# Patient Record
Sex: Female | Born: 1955 | Race: White | Hispanic: No | Marital: Married | Smoking: Former smoker
Health system: Southern US, Community
[De-identification: ages and names within clinical notes are randomized; demographics above are authoritative.]

## PROBLEM LIST (undated history)

## (undated) DIAGNOSIS — N189 Chronic kidney disease, unspecified: Secondary | ICD-10-CM

## (undated) DIAGNOSIS — T7840XA Allergy, unspecified, initial encounter: Secondary | ICD-10-CM

## (undated) DIAGNOSIS — E039 Hypothyroidism, unspecified: Secondary | ICD-10-CM

## (undated) DIAGNOSIS — K5792 Diverticulitis of intestine, part unspecified, without perforation or abscess without bleeding: Secondary | ICD-10-CM

## (undated) DIAGNOSIS — F32A Depression, unspecified: Secondary | ICD-10-CM

## (undated) DIAGNOSIS — K219 Gastro-esophageal reflux disease without esophagitis: Secondary | ICD-10-CM

## (undated) DIAGNOSIS — E785 Hyperlipidemia, unspecified: Secondary | ICD-10-CM

## (undated) DIAGNOSIS — M1990 Unspecified osteoarthritis, unspecified site: Secondary | ICD-10-CM

## (undated) DIAGNOSIS — D649 Anemia, unspecified: Secondary | ICD-10-CM

## (undated) DIAGNOSIS — F419 Anxiety disorder, unspecified: Secondary | ICD-10-CM

## (undated) DIAGNOSIS — F329 Major depressive disorder, single episode, unspecified: Secondary | ICD-10-CM

## (undated) HISTORY — DX: Anemia, unspecified: D64.9

## (undated) HISTORY — DX: Depression, unspecified: F32.A

## (undated) HISTORY — PX: APPENDECTOMY: SHX54

## (undated) HISTORY — DX: Hyperlipidemia, unspecified: E78.5

## (undated) HISTORY — DX: Diverticulitis of intestine, part unspecified, without perforation or abscess without bleeding: K57.92

## (undated) HISTORY — DX: Unspecified osteoarthritis, unspecified site: M19.90

## (undated) HISTORY — DX: Allergy, unspecified, initial encounter: T78.40XA

## (undated) HISTORY — DX: Anxiety disorder, unspecified: F41.9

## (undated) HISTORY — DX: Chronic kidney disease, unspecified: N18.9

## (undated) HISTORY — DX: Hypothyroidism, unspecified: E03.9

## (undated) HISTORY — DX: Major depressive disorder, single episode, unspecified: F32.9

## (undated) HISTORY — DX: Gastro-esophageal reflux disease without esophagitis: K21.9

---

## 1992-09-24 DIAGNOSIS — K5792 Diverticulitis of intestine, part unspecified, without perforation or abscess without bleeding: Secondary | ICD-10-CM

## 1992-09-24 HISTORY — PX: COLON SURGERY: SHX602

## 1992-09-24 HISTORY — DX: Diverticulitis of intestine, part unspecified, without perforation or abscess without bleeding: K57.92

## 1998-01-12 ENCOUNTER — Other Ambulatory Visit: Admission: RE | Admit: 1998-01-12 | Discharge: 1998-01-12 | Payer: Self-pay | Admitting: Obstetrics & Gynecology

## 1998-10-03 ENCOUNTER — Ambulatory Visit (HOSPITAL_COMMUNITY): Admission: RE | Admit: 1998-10-03 | Discharge: 1998-10-03 | Payer: Self-pay | Admitting: Obstetrics & Gynecology

## 1999-01-17 ENCOUNTER — Other Ambulatory Visit: Admission: RE | Admit: 1999-01-17 | Discharge: 1999-01-17 | Payer: Self-pay | Admitting: Obstetrics & Gynecology

## 1999-10-23 ENCOUNTER — Encounter: Admission: RE | Admit: 1999-10-23 | Discharge: 1999-10-23 | Payer: Self-pay | Admitting: Family Medicine

## 1999-10-23 ENCOUNTER — Encounter: Payer: Self-pay | Admitting: Family Medicine

## 2000-10-08 ENCOUNTER — Other Ambulatory Visit: Admission: RE | Admit: 2000-10-08 | Discharge: 2000-10-08 | Payer: Self-pay | Admitting: Family Medicine

## 2000-10-23 ENCOUNTER — Encounter: Payer: Self-pay | Admitting: Obstetrics & Gynecology

## 2000-10-23 ENCOUNTER — Encounter: Admission: RE | Admit: 2000-10-23 | Discharge: 2000-10-23 | Payer: Self-pay | Admitting: Obstetrics & Gynecology

## 2007-09-29 DIAGNOSIS — K219 Gastro-esophageal reflux disease without esophagitis: Secondary | ICD-10-CM | POA: Insufficient documentation

## 2007-09-29 DIAGNOSIS — F339 Major depressive disorder, recurrent, unspecified: Secondary | ICD-10-CM | POA: Insufficient documentation

## 2007-12-24 DIAGNOSIS — F411 Generalized anxiety disorder: Secondary | ICD-10-CM | POA: Insufficient documentation

## 2008-01-06 DIAGNOSIS — E119 Type 2 diabetes mellitus without complications: Secondary | ICD-10-CM | POA: Insufficient documentation

## 2008-01-28 DIAGNOSIS — E8881 Metabolic syndrome: Secondary | ICD-10-CM | POA: Insufficient documentation

## 2008-09-24 HISTORY — PX: BREAST SURGERY: SHX581

## 2009-02-24 DIAGNOSIS — E039 Hypothyroidism, unspecified: Secondary | ICD-10-CM | POA: Insufficient documentation

## 2009-06-02 DIAGNOSIS — M542 Cervicalgia: Secondary | ICD-10-CM | POA: Insufficient documentation

## 2009-06-24 DIAGNOSIS — E782 Mixed hyperlipidemia: Secondary | ICD-10-CM | POA: Insufficient documentation

## 2009-09-25 LAB — HM DIABETES EYE EXAM

## 2010-09-25 LAB — HM DIABETES FOOT EXAM

## 2012-01-14 ENCOUNTER — Ambulatory Visit (INDEPENDENT_AMBULATORY_CARE_PROVIDER_SITE_OTHER): Payer: Managed Care, Other (non HMO) | Admitting: Family Medicine

## 2012-01-14 VITALS — BP 106/70 | HR 81 | Temp 98.6°F | Resp 16 | Ht 63.58 in | Wt 233.0 lb

## 2012-01-14 DIAGNOSIS — R05 Cough: Secondary | ICD-10-CM

## 2012-01-14 DIAGNOSIS — R059 Cough, unspecified: Secondary | ICD-10-CM

## 2012-01-14 MED ORDER — HYDROCODONE-HOMATROPINE 5-1.5 MG/5ML PO SYRP: 5.0000 mL | ORAL_SOLUTION | Freq: Three times a day (TID) | ORAL | Status: AC | PRN

## 2012-01-14 MED ORDER — AZITHROMYCIN 250 MG PO TABS: ORAL_TABLET | ORAL | Status: AC

## 2012-01-14 MED ORDER — FLUTICASONE PROPIONATE 50 MCG/ACT NA SUSP: 2.0000 | Freq: Every day | NASAL | Status: DC

## 2012-01-14 NOTE — Progress Notes (Signed)
  Patient Name: Olivia Mosley Date of Birth: 1955/11/29 Medical Record Number: [id number] Gender: female Date of Encounter: 01/14/2012  History of Present Illness:  Olivia Mosley is a 56 y.o. very pleasant female patient who presents with the following:  Was treated by her PCP in [facility] for a sinus infection on 4/101/3 with augmentin for 7 days. Also received cough syrup (tussionex?) which helped a lot.  She continues to have a cough, ears are popping, sinus congestion.  Feels a "tickle" in her chest/ throat.  Cough is only occaionally productive.  No fevers anymore- she did have fevers at first.  Has felt fatigued- better now but the cough remains.   Does have "cough attacks" that are worrying her.   History of hypertension, DM, obesity There is no problem list on file for this patient.  No past medical history on file. No past surgical history on file. History  Substance Use Topics  . Smoking status: Former Games developer  . Smokeless tobacco: Not on file  . Alcohol Use: Not on file   No family history on file. Allergies  Allergen Reactions  . Flagyl (Metronidazole Hcl) Hives    Medication list has been reviewed and updated.  Review of Systems: As per HPI- otherwise negative.   Physical Examination: Filed Vitals:   01/14/12 1905  BP: 106/70  Pulse: 81  Temp: 98.6 F (37 C)  TempSrc: Oral  Resp: 16  Height: 5' 3.58" (1.615 m)  Weight: 233 lb (105.688 kg)    Body mass index is 40.52 kg/(m^2).  GEN: WDWN, NAD, Non-toxic, A & O x 3, obese HEENT: Atraumatic, Normocephalic. Neck supple. No masses, No LAD.  TM, oropharynx wnl, nasal cavity congested Ears and Nose: No external deformity. CV: RRR, No M/G/R. No JVD. No thrill. No extra heart sounds. PULM: CTA B, no wheezes, crackles, rhonchi. No retractions. No resp. distress. No accessory muscle use. ABD: S, NT, ND, +BS. No rebound. No HSM. EXTR: No c/c/e NEURO Normal gait.  PSYCH: Normally interactive. Conversant.  Not depressed or anxious appearing.  Calm demeanor.    Assessment and Plan: 1. Cough  HYDROcodone-homatropine (HYCODAN) 5-1.5 MG/5ML syrup, azithromycin (ZITHROMAX) 250 MG tablet  2. Allergic rhinitis  fluticasone (FLONASE) 50 MCG/ACT nasal spray   Will treat for atypicals with azithromycin, also allergies with flonase.  Gave hycodan rx as well- caution re: sedation.  Let us know if not better in a few days-Sooner if worse.

## 2012-01-21 ENCOUNTER — Telehealth: Payer: Self-pay | Admitting: Internal Medicine

## 2012-01-21 NOTE — Telephone Encounter (Signed)
The patient was recommended to you from her employer.  She has had an ongoing cough and is hoping you will take her on as a new patient.  Let me know if you want to see her, or I can direct her elsewhere.  Thanks!

## 2012-01-21 NOTE — Telephone Encounter (Signed)
Yes, she is Olivia Mosley and I agreed to see her. She can be worked in to this weeks schedule

## 2012-01-24 ENCOUNTER — Ambulatory Visit (INDEPENDENT_AMBULATORY_CARE_PROVIDER_SITE_OTHER): Payer: Managed Care, Other (non HMO) | Admitting: Internal Medicine

## 2012-01-24 ENCOUNTER — Encounter: Payer: Self-pay | Admitting: Internal Medicine

## 2012-01-24 VITALS — BP 100/62 | HR 83 | Temp 98.5°F | Resp 16 | Ht 64.0 in | Wt 231.0 lb

## 2012-01-24 DIAGNOSIS — F329 Major depressive disorder, single episode, unspecified: Secondary | ICD-10-CM

## 2012-01-24 DIAGNOSIS — M1990 Unspecified osteoarthritis, unspecified site: Secondary | ICD-10-CM

## 2012-01-24 DIAGNOSIS — F411 Generalized anxiety disorder: Secondary | ICD-10-CM

## 2012-01-24 DIAGNOSIS — M129 Arthropathy, unspecified: Secondary | ICD-10-CM

## 2012-01-24 DIAGNOSIS — E785 Hyperlipidemia, unspecified: Secondary | ICD-10-CM

## 2012-01-24 DIAGNOSIS — E039 Hypothyroidism, unspecified: Secondary | ICD-10-CM

## 2012-01-24 DIAGNOSIS — R059 Cough, unspecified: Secondary | ICD-10-CM

## 2012-01-24 DIAGNOSIS — R05 Cough: Secondary | ICD-10-CM

## 2012-01-24 DIAGNOSIS — F32A Depression, unspecified: Secondary | ICD-10-CM

## 2012-01-24 DIAGNOSIS — F419 Anxiety disorder, unspecified: Secondary | ICD-10-CM

## 2012-01-24 DIAGNOSIS — E119 Type 2 diabetes mellitus without complications: Secondary | ICD-10-CM

## 2012-01-24 MED ORDER — BENZONATATE 100 MG PO CAPS: 100.0000 mg | ORAL_CAPSULE | Freq: Three times a day (TID) | ORAL | Status: AC | PRN

## 2012-01-24 MED ORDER — PROMETHAZINE-CODEINE 6.25-10 MG/5ML PO SYRP: 5.0000 mL | ORAL_SOLUTION | ORAL | Status: AC | PRN

## 2012-01-24 MED ORDER — PREDNISONE 10 MG PO TABS: 10.0000 mg | ORAL_TABLET | Freq: Every day | ORAL | Status: DC

## 2012-01-24 NOTE — Patient Instructions (Signed)
Post-infectious cyclical cough: plan tessalon perles 3 times a day; promethazine w/ codeine cough syrup; prednisone 20 mg daily x 3, 10mg  daily x 6.
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
## 2012-01-27 ENCOUNTER — Encounter: Payer: Self-pay | Admitting: Internal Medicine

## 2012-01-27 DIAGNOSIS — F419 Anxiety disorder, unspecified: Secondary | ICD-10-CM | POA: Insufficient documentation

## 2012-01-27 DIAGNOSIS — E039 Hypothyroidism, unspecified: Secondary | ICD-10-CM | POA: Insufficient documentation

## 2012-01-27 DIAGNOSIS — F418 Other specified anxiety disorders: Secondary | ICD-10-CM | POA: Insufficient documentation

## 2012-01-27 DIAGNOSIS — M1990 Unspecified osteoarthritis, unspecified site: Secondary | ICD-10-CM | POA: Insufficient documentation

## 2012-01-27 DIAGNOSIS — R739 Hyperglycemia, unspecified: Secondary | ICD-10-CM | POA: Insufficient documentation

## 2012-01-27 DIAGNOSIS — E785 Hyperlipidemia, unspecified: Secondary | ICD-10-CM | POA: Insufficient documentation

## 2012-03-06 ENCOUNTER — Ambulatory Visit: Payer: Managed Care, Other (non HMO) | Admitting: Internal Medicine

## 2012-03-06 ENCOUNTER — Telehealth: Payer: Self-pay | Admitting: *Deleted

## 2012-03-06 NOTE — Telephone Encounter (Signed)
Waive missed appt fee

## 2012-03-06 NOTE — Telephone Encounter (Signed)
Patient and her employer, Mr. Maeola Sarah, called to cancell her appt. With you due to important work load today if office. Appt. Was at 11 am . Patient and Mr. Meschan request to be excused from this appt. With no penalty fee.Marland Kitchen

## 2012-03-06 NOTE — Telephone Encounter (Signed)
Patient notified of waived missed appt. fee

## 2012-03-12 ENCOUNTER — Ambulatory Visit (INDEPENDENT_AMBULATORY_CARE_PROVIDER_SITE_OTHER): Payer: Managed Care, Other (non HMO) | Admitting: Internal Medicine

## 2012-03-12 ENCOUNTER — Encounter: Payer: Self-pay | Admitting: Internal Medicine

## 2012-03-12 VITALS — BP 94/62 | HR 88 | Temp 97.2°F | Resp 16 | Ht 64.0 in | Wt 231.0 lb

## 2012-03-12 DIAGNOSIS — Z0000 Encounter for general adult medical examination without abnormal findings: Secondary | ICD-10-CM

## 2012-03-12 DIAGNOSIS — G4700 Insomnia, unspecified: Secondary | ICD-10-CM

## 2012-03-12 DIAGNOSIS — H8309 Labyrinthitis, unspecified ear: Secondary | ICD-10-CM

## 2012-03-12 DIAGNOSIS — Z1211 Encounter for screening for malignant neoplasm of colon: Secondary | ICD-10-CM

## 2012-03-12 DIAGNOSIS — E785 Hyperlipidemia, unspecified: Secondary | ICD-10-CM

## 2012-03-12 DIAGNOSIS — L259 Unspecified contact dermatitis, unspecified cause: Secondary | ICD-10-CM

## 2012-03-12 DIAGNOSIS — L309 Dermatitis, unspecified: Secondary | ICD-10-CM

## 2012-03-12 DIAGNOSIS — E119 Type 2 diabetes mellitus without complications: Secondary | ICD-10-CM

## 2012-03-12 DIAGNOSIS — E039 Hypothyroidism, unspecified: Secondary | ICD-10-CM

## 2012-03-12 MED ORDER — MECLIZINE HCL 12.5 MG PO TABS: 12.5000 mg | ORAL_TABLET | Freq: Three times a day (TID) | ORAL | Status: AC | PRN

## 2012-03-12 MED ORDER — ZOLPIDEM TARTRATE 10 MG PO TABS: 10.0000 mg | ORAL_TABLET | Freq: Every evening | ORAL | Status: DC | PRN

## 2012-03-13 ENCOUNTER — Other Ambulatory Visit: Payer: Self-pay | Admitting: *Deleted

## 2012-03-13 MED ORDER — ETODOLAC 500 MG PO TABS: 500.0000 mg | ORAL_TABLET | Freq: Every day | ORAL | Status: DC

## 2012-03-13 MED ORDER — ATORVASTATIN CALCIUM 40 MG PO TABS: 40.0000 mg | ORAL_TABLET | Freq: Every day | ORAL | Status: DC

## 2012-03-13 MED ORDER — ESOMEPRAZOLE MAGNESIUM 40 MG PO CPDR: 40.0000 mg | DELAYED_RELEASE_CAPSULE | Freq: Every day | ORAL | Status: DC

## 2012-03-13 MED ORDER — METFORMIN HCL ER 500 MG PO TB24: 500.0000 mg | ORAL_TABLET | Freq: Two times a day (BID) | ORAL | Status: DC

## 2012-03-13 MED ORDER — FEXOFENADINE HCL 180 MG PO TABS: 180.0000 mg | ORAL_TABLET | Freq: Every day | ORAL | Status: DC

## 2012-03-13 MED ORDER — CLOBETASOL PROPIONATE 0.05 % EX CREA: TOPICAL_CREAM | CUTANEOUS | Status: DC

## 2012-03-13 MED ORDER — ESCITALOPRAM OXALATE 10 MG PO TABS: 10.0000 mg | ORAL_TABLET | Freq: Every day | ORAL | Status: DC

## 2012-03-13 MED ORDER — AZELAIC ACID 15 % EX GEL: CUTANEOUS | Status: DC

## 2012-03-13 MED ORDER — LEVOTHYROXINE SODIUM 75 MCG PO TABS: 75.0000 ug | ORAL_TABLET | Freq: Every day | ORAL | Status: DC

## 2012-03-13 NOTE — Telephone Encounter (Signed)
Per patient request, all medications [except meclizine & zolpidem-printed script to patient] refilled to mail order: [facility] [from 06.19.13 OV]/SLS

## 2012-03-14 DIAGNOSIS — L309 Dermatitis, unspecified: Secondary | ICD-10-CM | POA: Insufficient documentation

## 2012-03-14 DIAGNOSIS — Z0000 Encounter for general adult medical examination without abnormal findings: Secondary | ICD-10-CM | POA: Insufficient documentation

## 2012-03-14 DIAGNOSIS — G4700 Insomnia, unspecified: Secondary | ICD-10-CM | POA: Insufficient documentation

## 2012-03-14 NOTE — Assessment & Plan Note (Addendum)
Olivia Mosley describes a sleep duration problem. Reviewed principle of sleep hygiene - see below. Recommended pre-sleep contemplative reflection to reduce risk of "rumination." Also stressed the importance of the sleep sanctuary.  Sleep is a learned or unlearned behavior. 5 principles of sleep hygiene - 1) regular hour to retire and rise 7days/wk 2) no stimulants - caffeine, chocolat, alcohol, 3) regular exercise  - every afternoon  4) sleep sanctuary - a space that is right light, temperature, sound level, good bed where all you do is sleep. 5) No extinction behaviors, e.g. Laying in bed awake doing anything but sleeping. This means if you have a bad night - no naps, etc  Plan -  At patient's request will substitute zolpidem for trazodone on trail basis.

## 2012-03-14 NOTE — Assessment & Plan Note (Signed)
On atorvastatin. She reports that she has had lab done since January.  Plan - recommendations on monitoring and dosing deferred until recent labs are available.

## 2012-03-14 NOTE — Assessment & Plan Note (Signed)
Olivia Mosley appears to be medically stable at this time. She is current with her gynecologist. She is a candidate for colonoscopy and referral to GI for procedure will be placed. Her immunization record is requested and any immunizations that may be due will be addressed.  In summary - a nice woman who is established for on-going continuity care. She will return as needed. Once her records are received and reviewed she will had med adjustments made and immunizations brought up to date.

## 2012-03-14 NOTE — Assessment & Plan Note (Signed)
Patient with skin changes suggestive of eczema. This is a long standing problem.  Plan -  Keep scheduled appointment at dermatologist's  Refilled on topical medications.

## 2012-03-14 NOTE — Progress Notes (Signed)
  Subjective:    Patient ID: Olivia Mosley, female    DOB: 1956/02/27, 56 y.o.   MRN: [id number]  HPI [name] was recently seen as a new patient - she has had a chance to review that note and has no additions or corrections. The cough she was treated for has resolved. In the interval since her visit we have not received prior records or lab results. She does have questions about her thyroid function but an assessment is dependent on recent labs yet to be received. She has several skin conditions that are chronic. An appointment with dermatology is scheduled for September '13 and prescriptions for skin medications will be refilled to carry over until that appointment.  She reports that she has sleep duration problems so that when she awakens in the early morning hours she cannot return to sleep but ruminates on a variety of issues. She has been taking trazodone at bedtime.   Swallowing does pose a problem for her, although her description of the problem is vague. She does not have true dysphagia with obstruction and regurgitation. There is no odynophagia.  PMH, FamHx and SocHx reviewed for any changes and relevance.    Review of Systems System review is negative for any constitutional, cardiac, pulmonary, GI or neuro symptoms or complaints other than as described in the HPI.     Objective:   Physical Exam Filed Vitals:   03/12/12 1413  BP: 94/62  Pulse: 88  Temp: 97.2 F (36.2 C)  Resp: 16  Weight: 231 lb (104.781 kg)   Gen'l: well nourished, well developed, overweight white woman in no distress HEENT - [place]/AT, EACs/TMs normal, oropharynx with native dentition in good condition, no buccal or palatal lesions, posterior pharynx clear, mucous membranes moist. C&S clear, PERRLA, fundi - normal Neck - supple, no thyromegaly Nodes- negative submental, cervical, supraclavicular regions Chest - no deformity, no CVAT Lungs - cleat without rales, wheezes. No increased work of breathing Breast  - deferred Cardiovascular - regular rate and rhythm, quiet precordium, no murmurs, rubs or gallops, 2+ radial, DP and PT pulses Abdomen - BS+ x 4, no HSM, no guarding or rebound or tenderness Pelvic - deferred to gyn Rectal - deferred to gyn Extremities - no clubbing, cyanosis, edema or deformity.  Neuro - A&O x 3, CN II-XII normal, motor strength normal and equal, DTRs 2+ and symmetrical biceps, radial, and patellar tendons. Cerebellar - no tremor, no rigidity, fluid movement and normal gait. Derm - Head, neck, back, abdomen and extremities without suspicious lesions. Several eczematous appearing lesions on her hands. Full skin exam deferred to dermatology        Assessment & Plan:

## 2012-03-14 NOTE — Assessment & Plan Note (Signed)
On replacement therapy. No signs or symptoms to suggest thyroid derangement. Labs were done, by her report, in January.  Plan - dose adjustment pending review of recent labs.

## 2012-03-14 NOTE — Assessment & Plan Note (Signed)
No available labs - reports from previous doctor are pending. Recommendations will be made after lab review.

## 2012-03-25 ENCOUNTER — Telehealth: Payer: Self-pay

## 2012-03-25 NOTE — Telephone Encounter (Signed)
Received fax from pharmacy requesting clarification on two RX. 1) RX received for metformin ER 500mg  bid, however pt shows recent history of taking plain metformin. Should this metformin be plain or ER?  2) RX for Azelaic 15% cream was received, however it is only available in a topical gel "finacea". Please advise if ok to dispense and if so, how many 50GM gel tubs would pt need for 90 supply. Thanks

## 2012-03-25 NOTE — Telephone Encounter (Signed)
1. Plain metformin bid 2. Ok for finacea 1/2 g bid prn 4 tubes for 90 days

## 2012-03-26 MED ORDER — METFORMIN HCL 500 MG PO TABS: 500.0000 mg | ORAL_TABLET | Freq: Two times a day (BID) | ORAL | Status: DC

## 2012-03-26 NOTE — Telephone Encounter (Signed)
[facility] informed of rx for Plain Metformin BID and of the Finacea gel.

## 2012-03-26 NOTE — Addendum Note (Signed)
Addended by: Carin Primrose on: 03/26/2012 04:14 PM   Modules accepted: Orders

## 2012-03-31 ENCOUNTER — Telehealth: Payer: Self-pay | Admitting: Internal Medicine

## 2012-03-31 NOTE — Telephone Encounter (Signed)
Gave pt 04/01/12 @ 1P---phone

## 2012-03-31 NOTE — Telephone Encounter (Signed)
Pt requesting today appt for itchy hives per pt---no availability with you or no doctor.  Thank you for your reply.

## 2012-04-01 ENCOUNTER — Ambulatory Visit (INDEPENDENT_AMBULATORY_CARE_PROVIDER_SITE_OTHER): Payer: Managed Care, Other (non HMO) | Admitting: Internal Medicine

## 2012-04-01 ENCOUNTER — Encounter: Payer: Self-pay | Admitting: Internal Medicine

## 2012-04-01 VITALS — BP 108/78 | HR 80 | Temp 98.0°F | Resp 16 | Ht 64.0 in | Wt 229.0 lb

## 2012-04-01 DIAGNOSIS — L509 Urticaria, unspecified: Secondary | ICD-10-CM

## 2012-04-01 MED ORDER — PREDNISONE 10 MG PO TABS: 10.0000 mg | ORAL_TABLET | Freq: Every day | ORAL | Status: DC

## 2012-04-01 MED ORDER — RANITIDINE HCL 150 MG PO TABS: 150.0000 mg | ORAL_TABLET | Freq: Two times a day (BID) | ORAL | Status: DC
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
## 2012-04-01 NOTE — Patient Instructions (Addendum)
Pruritic rash - may be a drug reaction. Plan - prednisone burst and taper as instructed; continue to take allegra daily; take zantac twice a day. For comfort - cooling baths with aveeno or oatmeal. Once the rash is cleared you can rechallenge yourself with gabapentin - resuming your previous dose. If no rash after 14 days can then rechallenge with ambien.

## 2012-04-03 ENCOUNTER — Other Ambulatory Visit: Payer: Self-pay | Admitting: *Deleted

## 2012-04-03 MED ORDER — ETODOLAC 500 MG PO TABS: 500.0000 mg | ORAL_TABLET | Freq: Every day | ORAL | Status: DC

## 2012-06-23 ENCOUNTER — Ambulatory Visit (INDEPENDENT_AMBULATORY_CARE_PROVIDER_SITE_OTHER): Payer: Managed Care, Other (non HMO) | Admitting: Internal Medicine

## 2012-06-23 ENCOUNTER — Encounter: Payer: Self-pay | Admitting: Internal Medicine

## 2012-06-23 ENCOUNTER — Other Ambulatory Visit (INDEPENDENT_AMBULATORY_CARE_PROVIDER_SITE_OTHER): Payer: Managed Care, Other (non HMO)

## 2012-06-23 VITALS — BP 112/84 | HR 71 | Temp 98.4°F | Resp 16 | Wt 229.0 lb

## 2012-06-23 DIAGNOSIS — E039 Hypothyroidism, unspecified: Secondary | ICD-10-CM

## 2012-06-23 DIAGNOSIS — E119 Type 2 diabetes mellitus without complications: Secondary | ICD-10-CM

## 2012-06-23 DIAGNOSIS — E785 Hyperlipidemia, unspecified: Secondary | ICD-10-CM

## 2012-06-23 DIAGNOSIS — F329 Major depressive disorder, single episode, unspecified: Secondary | ICD-10-CM

## 2012-06-23 DIAGNOSIS — F32A Depression, unspecified: Secondary | ICD-10-CM

## 2012-06-23 LAB — TSH: TSH: 2.16 u[IU]/mL (ref 0.35–5.50)

## 2012-06-23 LAB — COMPREHENSIVE METABOLIC PANEL
ALT: 19 U/L (ref 0–35)
AST: 18 U/L (ref 0–37)
Albumin: 3.7 g/dL (ref 3.5–5.2)
Alkaline Phosphatase: 66 U/L (ref 39–117)
BUN: 17 mg/dL (ref 6–23)
CO2: 22 mEq/L (ref 19–32)
Calcium: 9.2 mg/dL (ref 8.4–10.5)
Chloride: 107 mEq/L (ref 96–112)
Creatinine, Ser: 0.6 mg/dL (ref 0.4–1.2)
GFR: 101.96 mL/min (ref >60.00)
Glucose, Bld: 100 mg/dL — ABNORMAL HIGH (ref 70–99)
Potassium: 4.3 mEq/L (ref 3.5–5.1)
Sodium: 138 mEq/L (ref 135–145)
Total Bilirubin: 0.3 mg/dL (ref 0.3–1.2)
Total Protein: 6.9 g/dL (ref 6.0–8.3)

## 2012-06-23 LAB — HEPATIC FUNCTION PANEL
ALT: 19 U/L (ref 0–35)
AST: 18 U/L (ref 0–37)
Albumin: 3.7 g/dL (ref 3.5–5.2)
Alkaline Phosphatase: 66 U/L (ref 39–117)
Bilirubin, Direct: 0.1 mg/dL (ref 0.0–0.3)
Total Bilirubin: 0.3 mg/dL (ref 0.3–1.2)
Total Protein: 6.9 g/dL (ref 6.0–8.3)

## 2012-06-23 LAB — LIPID PANEL
Cholesterol: 156 mg/dL (ref 0–200)
HDL: 52.7 mg/dL (ref >39.00)
LDL Cholesterol: 86 mg/dL (ref 0–99)
Total CHOL/HDL Ratio: 3
Triglycerides: 89 mg/dL (ref 0.0–149.0)
VLDL: 17.8 mg/dL (ref 0.0–40.0)

## 2012-06-23 LAB — HEMOGLOBIN A1C: Hgb A1c MFr Bld: 6.1 % (ref 4.6–6.5)

## 2012-06-23 LAB — T4, FREE: Free T4: 0.89 ng/dL (ref 0.60–1.60)

## 2012-06-23 NOTE — Assessment & Plan Note (Signed)
Due for A1C - recommendations to follow.  Addendum: Lab Results  Component Value Date   HGBA1C 6.1 06/23/2012   Excellent control  Continue present medications.

## 2012-06-23 NOTE — Assessment & Plan Note (Signed)
Stable and doing well on her present medications

## 2012-06-23 NOTE — Assessment & Plan Note (Signed)
Due for lipid panel with recommendations to follow.  Addendum: LDL 82 - excellent control; HDL 52. Liver functions normal

## 2012-06-23 NOTE — Assessment & Plan Note (Signed)
For follow-up TSH with recommendations to follow.  Lab Results  Component Value Date   TSH 2.16 06/23/2012   Good control on present medication.

## 2012-06-23 NOTE — Patient Instructions (Addendum)
Will check thyroid, cholesterol and diabetes labs today. You can use MyChart for results - give me two working days.
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
## 2012-06-29 ENCOUNTER — Encounter: Payer: Self-pay | Admitting: Internal Medicine

## 2012-07-24 ENCOUNTER — Other Ambulatory Visit: Payer: Self-pay | Admitting: Internal Medicine

## 2012-07-28 ENCOUNTER — Telehealth: Payer: Self-pay | Admitting: Internal Medicine

## 2012-07-28 NOTE — Telephone Encounter (Signed)
Patient calling, has had dizziness since Sunday.  She is able to get up and walk but has to be careful.  She thought it was from the increase in her Neurontin and she stopped that on 11/3.   Triaged per Dizziness.  Needs to be seen in 4 hours.   No appts. left for this afternoon.  Please call to schedule same.

## 2012-07-29 ENCOUNTER — Ambulatory Visit (INDEPENDENT_AMBULATORY_CARE_PROVIDER_SITE_OTHER): Payer: Managed Care, Other (non HMO) | Admitting: Internal Medicine

## 2012-07-29 ENCOUNTER — Ambulatory Visit: Payer: Managed Care, Other (non HMO) | Admitting: Internal Medicine

## 2012-07-29 ENCOUNTER — Encounter: Payer: Self-pay | Admitting: Internal Medicine

## 2012-07-29 VITALS — BP 106/72 | HR 87 | Temp 98.3°F | Resp 16 | Wt 232.0 lb

## 2012-07-29 DIAGNOSIS — H8309 Labyrinthitis, unspecified ear: Secondary | ICD-10-CM

## 2012-07-29 MED ORDER — MECLIZINE HCL 12.5 MG PO TABS: 12.5000 mg | ORAL_TABLET | Freq: Three times a day (TID) | ORAL | Status: DC | PRN

## 2012-07-29 NOTE — Telephone Encounter (Signed)
Please add on pt to MEN's schedule today.

## 2012-07-29 NOTE — Telephone Encounter (Signed)
May add on to Tuesday, 11/5, schedule

## 2012-07-29 NOTE — Progress Notes (Signed)
  Subjective:    Patient ID: Olivia Mosley, female    DOB: Dec 16, 1955, 56 y.o.   MRN: [id number]  HPI Olivia Mosley presents for 3 days of dizziness: dizzy in bed, dizzy with rapid head movement, off balance with walking. Has had sinus drainage. She has had no fever, no focal neurologic symptoms.    Review of Systems System review is negative for any constitutional, cardiac, pulmonary, GI or neuro symptoms or complaints other than as described in the HPI.     Objective:   Physical Exam Filed Vitals:   07/29/12 1437  BP: 106/72  Pulse: 87  Temp: 98.3 F (36.8 C)  Resp: 16   Wt Readings from Last 3 Encounters:  07/29/12 232 lb (105.235 kg)  06/23/12 229 lb (103.874 kg)  04/01/12 229 lb (103.874 kg)   Gen'l- WNWD overweight white female in no acute distress HEENT - C&S clear Cor 2+ radial pulse, RRR PUlm - normal respirations Neuro - normal facial symmetry, nl finger-nose, heel-shin, nl gait and tandem gait.       Assessment & Plan:  labyrinthitis - dizziness due to dysfunction of the vestibular apparatus - the gyroscope in you skull - that help [place] balance. See handout.  Plan Take meclizine 12.5 mg three times a day for 3 days then 3 times a day as needed. If not quite better enough can increase to 25 mg

## 2012-07-29 NOTE — Patient Instructions (Addendum)
labyrinthitis - dizziness due to dysfunction of the vestibular apparatus - the gyroscope in you skull - that help [place] balance. See handout.  Plan Take meclizine 12.5 mg three times a day for 3 days then 3 times a day as needed. If not quite better enough can increase to 25 mg  Labyrinthitis (Inner Ear Inflammation) Your exam shows you have an inner ear disturbance or labyrinthitis. The cause of this condition is not known. But it may be due to a virus infection. The symptoms of labyrinthitis include vertigo or dizziness made worse by motion, nausea and vomiting. The onset of labyrinthitis may be very sudden. It usually lasts for a few days and then clears up over 1-2 weeks. The treatment of an inner ear disturbance includes bed rest and medications to reduce dizziness, nausea, and vomiting. You should stay away from alcohol, tranquilizers, caffeine, nicotine, or any medicine your doctor thinks may make your symptoms worse. Further testing may be needed to evaluate your hearing and balance system. Please see your doctor or go to the emergency room right away if you have:  Increasing vertigo, earache, loss of hearing, or ear drainage.   Headache, blurred vision, trouble walking, fainting, or fever.   Persistent vomiting, dehydration, or extreme weakness.  Document Released: 09/10/2005 Document Revised: 12/03/2011 Document Reviewed: 02/26/2007 [facility] Patient Information 2013 [place], [place].

## 2012-07-29 NOTE — Telephone Encounter (Signed)
APPT 2:00 TODAY

## 2012-08-25 ENCOUNTER — Other Ambulatory Visit: Payer: Self-pay | Admitting: *Deleted

## 2012-08-25 MED ORDER — ATORVASTATIN CALCIUM 40 MG PO TABS: 40.0000 mg | ORAL_TABLET | Freq: Every day | ORAL | Status: DC

## 2012-08-25 MED ORDER — ETODOLAC 500 MG PO TABS: 500.0000 mg | ORAL_TABLET | Freq: Every day | ORAL | Status: DC

## 2012-08-25 MED ORDER — LEVOTHYROXINE SODIUM 75 MCG PO TABS: 75.0000 ug | ORAL_TABLET | Freq: Every day | ORAL | Status: DC

## 2012-08-26 ENCOUNTER — Other Ambulatory Visit: Payer: Self-pay | Admitting: *Deleted

## 2012-08-26 NOTE — Telephone Encounter (Signed)
Opened encounter in error  

## 2012-08-29 ENCOUNTER — Other Ambulatory Visit: Payer: Self-pay | Admitting: *Deleted

## 2012-08-29 MED ORDER — ETODOLAC 500 MG PO TABS: 500.0000 mg | ORAL_TABLET | Freq: Every day | ORAL | Status: DC

## 2012-08-29 MED ORDER — LEVOTHYROXINE SODIUM 75 MCG PO TABS: 75.0000 ug | ORAL_TABLET | Freq: Every day | ORAL | Status: DC

## 2012-08-29 MED ORDER — ATORVASTATIN CALCIUM 40 MG PO TABS: 40.0000 mg | ORAL_TABLET | Freq: Every day | ORAL | Status: DC

## 2012-08-29 NOTE — Telephone Encounter (Signed)
The rx refill never went through to [facility]. Reordered the rx for Lipitor, Synthroid, and Lodine.

## 2012-09-02 ENCOUNTER — Other Ambulatory Visit: Payer: Self-pay | Admitting: *Deleted

## 2012-09-02 MED ORDER — FEXOFENADINE HCL 180 MG PO TABS: 180.0000 mg | ORAL_TABLET | Freq: Every day | ORAL | Status: DC

## 2012-09-10 ENCOUNTER — Other Ambulatory Visit: Payer: Self-pay | Admitting: *Deleted

## 2012-09-10 MED ORDER — ESCITALOPRAM OXALATE 10 MG PO TABS: 10.0000 mg | ORAL_TABLET | Freq: Every day | ORAL | Status: DC

## 2012-11-11 ENCOUNTER — Encounter: Payer: Self-pay | Admitting: Internal Medicine

## 2012-12-07 ENCOUNTER — Ambulatory Visit: Payer: Managed Care, Other (non HMO) | Admitting: Emergency Medicine

## 2012-12-07 VITALS — BP 110/70 | HR 92 | Temp 98.5°F | Resp 18 | Ht 63.5 in | Wt 233.0 lb

## 2012-12-07 DIAGNOSIS — J209 Acute bronchitis, unspecified: Secondary | ICD-10-CM

## 2012-12-07 DIAGNOSIS — J018 Other acute sinusitis: Secondary | ICD-10-CM

## 2012-12-07 MED ORDER — PSEUDOEPHEDRINE-GUAIFENESIN ER 60-600 MG PO TB12: 1.0000 | ORAL_TABLET | Freq: Two times a day (BID) | ORAL | Status: DC

## 2012-12-07 MED ORDER — HYDROCOD POLST-CHLORPHEN POLST 10-8 MG/5ML PO LQCR: 5.0000 mL | Freq: Two times a day (BID) | ORAL | Status: DC | PRN

## 2012-12-07 MED ORDER — AMOXICILLIN 500 MG PO CAPS: 500.0000 mg | ORAL_CAPSULE | Freq: Three times a day (TID) | ORAL | Status: DC

## 2012-12-07 NOTE — Progress Notes (Signed)
[facility] [address], [place] [address] [phone]  Date:  12/07/2012   Name:  Olivia Mosley   DOB:  08/11/1956   MRN:  [id number]  PCP:  Illene Regulus, MD    Chief Complaint: Cough   History of Present Illness:  Olivia Mosley is a 57 y.o. very pleasant female patient who presents with the following:  Ill with nasal congestion and post nasal drip. Mucoid in character.  Now has cough productive purulent sputum.  No wheezing or shortness of breath.  No fever or chills.  No nausea or vomiting.  No rash.  No improvement with over the counter medications or other home remedies. Denies other complaint or health concern today.   Not testing sugar to monitor her diabetes  Patient Active Problem List  Diagnosis  . Hyperlipidemia  . Anxiety  . Arthritis  . Type II or unspecified type diabetes mellitus without mention of complication, not stated as uncontrolled  . Hypothyroidism  . Depression  . Insomnia  . Eczema  . Routine health maintenance    Past Medical History  Diagnosis Date  . Diverticulitis 1994  . Hyperlipidemia   . Chronic kidney disease     kidnetstone x2  . Anxiety   . 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 Depression 1980/1988    in-patient treatment   . Diabetes mellitus 2007  . HIV infection     tested negative  . Hypothyroidism     Past Surgical History  Procedure Laterality Date  . Breast surgery  2010    benign  . Appendectomy      1994  . Colon surgery  1994    bowel resection    History  Substance Use Topics  . Smoking status: Never Smoker   . Smokeless tobacco: Never Used  . Alcohol Use: Yes    Family History  Problem Relation Age of Onset  . Depression Mother   . Stroke Mother   . COPD Mother   . Alcohol abuse Father   . Hyperlipidemia Father   . Heart disease Father   . Hypertension Father   . Cancer Sister     breast  . Cancer Sister     breast    Allergies   Allergen Reactions  . Flagyl (Metronidazole Hcl) Hives    Medication list has been reviewed and updated.  Current Outpatient Prescriptions on File Prior to Visit  Medication Sig Dispense Refill  . atorvastatin (LIPITOR) 40 MG tablet Take 1 tablet (40 mg total) by mouth daily.  90 tablet  1  . Azelaic Acid 15 % cream Apply topically daily.  90 g  1  . carisoprodol (SOMA) 350 MG tablet Take 350 mg by mouth daily.      . clobetasol cream (TEMOVATE) 0.05 % Apply topically as needed.  90 g  1  . escitalopram (LEXAPRO) 10 MG tablet Take 1 tablet (10 mg total) by mouth daily.  90 tablet  1  . esomeprazole (NEXIUM) 40 MG capsule Take 1 capsule (40 mg total) by mouth daily.  90 capsule  1  . etodolac (LODINE) 500 MG tablet Take 1 tablet (500 mg total) by mouth daily.  90 tablet  1  . fexofenadine (ALLEGRA) 180 MG tablet Take 1 tablet (180 mg total) by mouth daily.  90 tablet  1  . gabapentin (NEURONTIN) 100 MG capsule Take 100 mg by mouth daily.  X ONE WEEK      . levothyroxine (SYNTHROID, LEVOTHROID) 75 MCG tablet Take 1 tablet (75 mcg total) by mouth daily.  90 tablet  1  . metFORMIN (GLUCOPHAGE) 500 MG tablet Take 1 tablet (500 mg total) by mouth 2 (two) times daily with a meal.  180 tablet  1  . ranitidine (ZANTAC) 150 MG tablet take 1 tablet by mouth twice a day  60 tablet  5  . tretinoin (RETIN-A) 0.025 % cream Apply topically at bedtime.      . meclizine (ANTIVERT) 12.5 MG tablet Take 1 tablet (12.5 mg total) by mouth 3 (three) times daily as needed.  30 tablet  5  . zolpidem (AMBIEN) 10 MG tablet Take 10 mg by mouth at bedtime as needed.       No current facility-administered medications on file prior to visit.    Review of Systems:  As per HPI, otherwise negative.    Physical Examination: Filed Vitals:   12/07/12 1742  BP: 110/70  Pulse: 92  Temp: 98.5 F (36.9 C)  Resp: 18   Filed Vitals:   12/07/12 1742  Height: 5' 3.5" (1.613 m)  Weight: 233 lb (105.688 kg)   Body  mass index is 40.62 kg/(m^2). Ideal Body Weight: Weight in (lb) to have BMI = 25: 143.1  GEN: WDWN, NAD, Non-toxic, A & O x 3 HEENT: Atraumatic, Normocephalic. Neck supple. No masses, No LAD. Ears and Nose: No external deformity. CV: RRR, No M/G/R. No JVD. No thrill. No extra heart sounds. PULM: CTA B, no wheezes, crackles, rhonchi. No retractions. No resp. distress. No accessory muscle use. ABD: S, NT, ND, +BS. No rebound. No HSM. EXTR: No c/c/e NEURO Normal gait.  PSYCH: Normally interactive. Conversant. Not depressed or anxious appearing.  Calm demeanor.    Assessment and Plan: Sinusitis Amoxicillin mucinex d tussionex Follow up as needed  Carmelina Dane, MD

## 2012-12-07 NOTE — Patient Instructions (Addendum)
Bronchitis  Bronchitis is the body's way of reacting to injury and/or infection (inflammation) of the bronchi. Bronchi are the air tubes that extend from the windpipe into the lungs. If the inflammation becomes severe, it may cause shortness of breath.  CAUSES   Inflammation may be caused by:   A virus.   Germs (bacteria).   Dust.   Allergens.   Pollutants and many other irritants.  The cells lining the bronchial tree are covered with tiny hairs (cilia). These constantly beat upward, away from the lungs, toward the mouth. This keeps the lungs free of pollutants. When these cells become too irritated and are unable to do their job, mucus begins to develop. This causes the characteristic cough of bronchitis. The cough clears the lungs when the cilia are unable to do their job. Without either of these protective mechanisms, the mucus would settle in the lungs. Then you would develop pneumonia.  Smoking is a common cause of bronchitis and can contribute to pneumonia. Stopping this habit is the single most important thing you can do to help yourself.  TREATMENT    Your caregiver may prescribe an antibiotic if the cough is caused by bacteria. Also, medicines that open up your airways make it easier to breathe. Your caregiver may also recommend or prescribe an expectorant. It will loosen the mucus to be coughed up. Only take over-the-counter or prescription medicines for pain, discomfort, or fever as directed by your caregiver.   Removing whatever causes the problem (smoking, for example) is critical to preventing the problem from getting worse.   Cough suppressants may be prescribed for relief of cough symptoms.   Inhaled medicines may be prescribed to help with symptoms now and to help prevent problems from returning.   For those with recurrent (chronic) bronchitis, there may be a need for steroid medicines.  SEEK IMMEDIATE MEDICAL CARE IF:    During treatment, you develop more pus-like mucus (purulent  sputum).   You have a fever.   Your baby is older than 3 months with a rectal temperature of 102 F (38.9 C) or higher.   Your baby is 3 months old or younger with a rectal temperature of 100.4 F (38 C) or higher.   You become progressively more ill.   You have increased difficulty breathing, wheezing, or shortness of breath.  It is necessary to seek immediate medical care if you are elderly or sick from any other disease.  MAKE SURE YOU:    Understand these instructions.   Will watch your condition.   Will get help right away if you are not doing well or get worse.  Document Released: 09/10/2005 Document Revised: 12/03/2011 Document Reviewed: 07/20/2008  [employer] Patient Information 2013 [employer].    Sinusitis  Sinusitis is redness, soreness, and swelling (inflammation) of the paranasal sinuses. Paranasal sinuses are air pockets within the bones of your face (beneath the eyes, the middle of the forehead, or above the eyes). In healthy paranasal sinuses, mucus is able to drain out, and air is able to circulate through them by way of your nose. However, when your paranasal sinuses are inflamed, mucus and air can become trapped. This can allow bacteria and other germs to grow and cause infection.  Sinusitis can develop quickly and last only a short time (acute) or continue over a long period (chronic). Sinusitis that lasts for more than 12 weeks is considered chronic.   CAUSES   Causes of sinusitis include:   Allergies.     Structural abnormalities, such as displacement of the cartilage that separates your nostrils (deviated septum), which can decrease the air flow through your nose and sinuses and affect sinus drainage.   Functional abnormalities, such as when the small hairs (cilia) that line your sinuses and help remove mucus do not work properly or are not present.  SYMPTOMS   Symptoms of acute and chronic sinusitis are the same. The primary symptoms are pain and pressure around the affected  sinuses. Other symptoms include:   Upper toothache.   Earache.   Headache.   Bad breath.   Decreased sense of smell and taste.   A cough, which worsens when you are lying flat.   Fatigue.   Fever.   Thick drainage from your nose, which often is green and may contain pus (purulent).   Swelling and warmth over the affected sinuses.  DIAGNOSIS   Your caregiver will perform a physical exam. During the exam, your caregiver may:   Look in your nose for signs of abnormal growths in your nostrils (nasal polyps).   Tap over the affected sinus to check for signs of infection.   View the inside of your sinuses (endoscopy) with a special imaging device with a light attached (endoscope), which is inserted into your sinuses.  If your caregiver suspects that you have chronic sinusitis, one or more of the following tests may be recommended:   Allergy tests.   Nasal culture A sample of mucus is taken from your nose and sent to a lab and screened for bacteria.   Nasal cytology A sample of mucus is taken from your nose and examined by your caregiver to determine if your sinusitis is related to an allergy.  TREATMENT   Most cases of acute sinusitis are related to a viral infection and will resolve on their own within 10 days. Sometimes medicines are prescribed to help relieve symptoms (pain medicine, decongestants, nasal steroid sprays, or saline sprays).   However, for sinusitis related to a bacterial infection, your caregiver will prescribe antibiotic medicines. These are medicines that will help kill the bacteria causing the infection.   Rarely, sinusitis is caused by a fungal infection. In theses cases, your caregiver will prescribe antifungal medicine.  For some cases of chronic sinusitis, surgery is needed. Generally, these are cases in which sinusitis recurs more than 3 times per year, despite other treatments.  HOME CARE INSTRUCTIONS    Drink plenty of water. Water helps thin the mucus so your sinuses can drain  more easily.   Use a humidifier.   Inhale steam 3 to 4 times a day (for example, sit in the bathroom with the shower running).   Apply a warm, moist washcloth to your face 3 to 4 times a day, or as directed by your caregiver.   Use saline nasal sprays to help moisten and clean your sinuses.   Take over-the-counter or prescription medicines for pain, discomfort, or fever only as directed by your caregiver.  SEEK IMMEDIATE MEDICAL CARE IF:   You have increasing pain or severe headaches.   You have nausea, vomiting, or drowsiness.   You have swelling around your face.   You have vision problems.   You have a stiff neck.   You have difficulty breathing.  MAKE SURE YOU:    Understand these instructions.   Will watch your condition.   Will get help right away if you are not doing well or get worse.  Document Released: 09/10/2005 Document Revised: 12/03/2011 Document

## 2012-12-07 NOTE — Addendum Note (Signed)
Addended by: Carmelina Dane on: 12/07/2012 06:05 PM   Modules accepted: Orders

## 2012-12-08 ENCOUNTER — Encounter: Payer: Self-pay | Admitting: Internal Medicine

## 2012-12-09 ENCOUNTER — Other Ambulatory Visit: Payer: Self-pay

## 2012-12-09 MED ORDER — CLOBETASOL PROPIONATE 0.05 % EX CREA: TOPICAL_CREAM | CUTANEOUS | Status: DC

## 2012-12-09 MED ORDER — TRETINOIN 0.025 % EX CREA: TOPICAL_CREAM | Freq: Every day | CUTANEOUS | Status: DC

## 2012-12-09 MED ORDER — METFORMIN HCL 500 MG PO TABS: 500.0000 mg | ORAL_TABLET | Freq: Two times a day (BID) | ORAL | Status: DC

## 2012-12-09 MED ORDER — ESOMEPRAZOLE MAGNESIUM 40 MG PO CPDR: 40.0000 mg | DELAYED_RELEASE_CAPSULE | Freq: Every day | ORAL | Status: DC

## 2012-12-10 ENCOUNTER — Telehealth: Payer: Self-pay

## 2012-12-10 NOTE — Telephone Encounter (Signed)
tussionex

## 2012-12-10 NOTE — Telephone Encounter (Signed)
Patient is requesting more cough medicine.  She finished the first bottle and wants a larger quantity.  She is coughing an annoying her employer.  [phone]

## 2012-12-11 NOTE — Telephone Encounter (Signed)
Patient is requesting a stronger antibiotic - and stronger cough medicine - [facility] [phone]  Olivia Mosley - not sure of what street  [phone]

## 2012-12-11 NOTE — Telephone Encounter (Signed)
Too soon to renew the cough meds. How much is she taking. Amox is the appropriate antibiotic, if she is not improving she should return to clinic. Called her to advise.

## 2012-12-15 ENCOUNTER — Other Ambulatory Visit: Payer: Self-pay

## 2012-12-15 ENCOUNTER — Ambulatory Visit (INDEPENDENT_AMBULATORY_CARE_PROVIDER_SITE_OTHER): Payer: Managed Care, Other (non HMO) | Admitting: Internal Medicine

## 2012-12-15 ENCOUNTER — Encounter: Payer: Self-pay | Admitting: Internal Medicine

## 2012-12-15 VITALS — BP 102/70 | HR 95 | Temp 97.4°F | Ht 63.5 in | Wt 232.0 lb

## 2012-12-15 DIAGNOSIS — J019 Acute sinusitis, unspecified: Secondary | ICD-10-CM

## 2012-12-15 MED ORDER — HYDROCOD POLST-CHLORPHEN POLST 10-8 MG/5ML PO LQCR: 5.0000 mL | Freq: Two times a day (BID) | ORAL | Status: DC | PRN

## 2012-12-15 MED ORDER — METFORMIN HCL 500 MG PO TABS: 500.0000 mg | ORAL_TABLET | Freq: Two times a day (BID) | ORAL | Status: DC

## 2012-12-15 MED ORDER — FLUTICASONE PROPIONATE 50 MCG/ACT NA SUSP: 2.0000 | Freq: Every day | NASAL | Status: DC

## 2012-12-15 MED ORDER — LEVOFLOXACIN 500 MG PO TABS: 500.0000 mg | ORAL_TABLET | Freq: Every day | ORAL | Status: DC
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
## 2012-12-15 NOTE — Patient Instructions (Signed)

## 2012-12-30 ENCOUNTER — Other Ambulatory Visit: Payer: Self-pay

## 2012-12-30 MED ORDER — TRAZODONE HCL 50 MG PO TABS: 25.0000 mg | ORAL_TABLET | Freq: Every evening | ORAL | Status: DC | PRN

## 2012-12-30 NOTE — Telephone Encounter (Signed)
New prescription request faxed from [facility]. Please advise.

## 2013-01-15 ENCOUNTER — Telehealth: Payer: Self-pay

## 2013-01-15 MED ORDER — TRAZODONE HCL 50 MG PO TABS: 25.0000 mg | ORAL_TABLET | Freq: Every evening | ORAL | Status: DC | PRN

## 2013-01-15 NOTE — Telephone Encounter (Signed)
Phone call from pt stating her Trazodone was not sent to [facility]. I let her know it was sent to [facility] and will call and cancel that and send it to [facility].

## 2013-02-25 ENCOUNTER — Other Ambulatory Visit: Payer: Self-pay | Admitting: Internal Medicine

## 2013-02-25 NOTE — Telephone Encounter (Signed)
Okay to refill Lexapro? 

## 2013-02-25 NOTE — Telephone Encounter (Signed)
Ok for refills but needs to schedule CPX

## 2013-03-10 ENCOUNTER — Encounter: Payer: Self-pay | Admitting: Internal Medicine

## 2013-03-10 ENCOUNTER — Ambulatory Visit (INDEPENDENT_AMBULATORY_CARE_PROVIDER_SITE_OTHER): Payer: Managed Care, Other (non HMO) | Admitting: Internal Medicine

## 2013-03-10 ENCOUNTER — Other Ambulatory Visit (INDEPENDENT_AMBULATORY_CARE_PROVIDER_SITE_OTHER): Payer: Managed Care, Other (non HMO)

## 2013-03-10 VITALS — BP 124/70 | HR 104 | Temp 97.3°F | Resp 12 | Ht 64.0 in | Wt 235.8 lb

## 2013-03-10 DIAGNOSIS — E785 Hyperlipidemia, unspecified: Secondary | ICD-10-CM

## 2013-03-10 DIAGNOSIS — M549 Dorsalgia, unspecified: Secondary | ICD-10-CM

## 2013-03-10 DIAGNOSIS — E039 Hypothyroidism, unspecified: Secondary | ICD-10-CM

## 2013-03-10 DIAGNOSIS — D509 Iron deficiency anemia, unspecified: Secondary | ICD-10-CM

## 2013-03-10 DIAGNOSIS — G8929 Other chronic pain: Secondary | ICD-10-CM

## 2013-03-10 DIAGNOSIS — E119 Type 2 diabetes mellitus without complications: Secondary | ICD-10-CM

## 2013-03-10 LAB — HEMOGLOBIN AND HEMATOCRIT, BLOOD
HCT: 36.2 % (ref 36.0–46.0)
Hemoglobin: 11.8 g/dL — ABNORMAL LOW (ref 12.0–15.0)

## 2013-03-10 LAB — RETICULOCYTES
ABS Retic: 48.7 10*3/uL (ref 19.0–186.0)
RBC.: 4.43 MIL/uL (ref 3.87–5.11)
Retic Ct Pct: 1.1 % (ref 0.4–2.3)

## 2013-03-10 LAB — HEMOGLOBIN A1C: Hgb A1c MFr Bld: 6 % (ref 4.6–6.5)

## 2013-03-10 NOTE — Assessment & Plan Note (Addendum)
No sign of active bleeding but with iron deficiency will recheck IBC panel, H/H with recommendations to follow. She has not had colonoscopy since'97  Addendum: Hgb a little low, iron low and % sat very low. Recommendation - colonoscopy

## 2013-03-10 NOTE — Assessment & Plan Note (Signed)
For lab today with recommendations to follow  

## 2013-03-10 NOTE — Assessment & Plan Note (Signed)
Lab Results  Component Value Date   HGBA1C 6.1 06/23/2012   Due for follow up lab today with recommendations to follow.

## 2013-03-10 NOTE — Patient Instructions (Addendum)
Iron deficiency - will check iron levels. Low iron is usually a sign of blood loss of some time. Will be checking a blood count today and recommendations will follow.  Will also check your diabetes and thryoid status today; will check routine labs - kidney function and electrolytes; will check lipid and liver functions today.  Results of labs will be available on MyChart.   Will set up a referral to [facility] for management of chronic back pain.

## 2013-03-10 NOTE — Assessment & Plan Note (Signed)
For lab today with recommendations to follow
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
## 2013-03-11 LAB — LIPID PANEL
Cholesterol: 159 mg/dL (ref 0–200)
HDL: 53.3 mg/dL (ref >39.00)
LDL Cholesterol: 78 mg/dL (ref 0–99)
Total CHOL/HDL Ratio: 3
Triglycerides: 140 mg/dL (ref 0.0–149.0)
VLDL: 28 mg/dL (ref 0.0–40.0)

## 2013-03-11 LAB — COMPREHENSIVE METABOLIC PANEL
ALT: 24 U/L (ref 0–35)
AST: 19 U/L (ref 0–37)
Albumin: 3.9 g/dL (ref 3.5–5.2)
Alkaline Phosphatase: 69 U/L (ref 39–117)
BUN: 17 mg/dL (ref 6–23)
CO2: 26 mEq/L (ref 19–32)
Calcium: 9.6 mg/dL (ref 8.4–10.5)
Chloride: 110 mEq/L (ref 96–112)
Creatinine, Ser: 0.6 mg/dL (ref 0.4–1.2)
GFR: 107.49 mL/min (ref >60.00)
Glucose, Bld: 73 mg/dL (ref 70–99)
Potassium: 4.8 mEq/L (ref 3.5–5.1)
Sodium: 144 mEq/L (ref 135–145)
Total Bilirubin: 0.3 mg/dL (ref 0.3–1.2)
Total Protein: 7 g/dL (ref 6.0–8.3)

## 2013-03-11 LAB — TSH: TSH: 2.45 u[IU]/mL (ref 0.35–5.50)

## 2013-03-11 LAB — IBC PANEL
Iron: 22 ug/dL — ABNORMAL LOW (ref 42–145)
Saturation Ratios: 5.7 % — ABNORMAL LOW (ref 20.0–50.0)
Transferrin: 277.9 mg/dL (ref 212.0–360.0)

## 2013-03-11 LAB — HEPATIC FUNCTION PANEL
ALT: 24 U/L (ref 0–35)
AST: 19 U/L (ref 0–37)
Albumin: 3.9 g/dL (ref 3.5–5.2)
Alkaline Phosphatase: 69 U/L (ref 39–117)
Bilirubin, Direct: 0 mg/dL (ref 0.0–0.3)
Total Bilirubin: 0.3 mg/dL (ref 0.3–1.2)
Total Protein: 7 g/dL (ref 6.0–8.3)

## 2013-03-17 ENCOUNTER — Encounter: Payer: Self-pay | Admitting: Internal Medicine

## 2013-04-06 ENCOUNTER — Encounter: Payer: Self-pay | Admitting: Internal Medicine

## 2013-04-06 ENCOUNTER — Ambulatory Visit (INDEPENDENT_AMBULATORY_CARE_PROVIDER_SITE_OTHER): Payer: Managed Care, Other (non HMO) | Admitting: Internal Medicine

## 2013-04-06 ENCOUNTER — Telehealth: Payer: Self-pay | Admitting: *Deleted

## 2013-04-06 VITALS — BP 112/72 | HR 120 | Temp 98.5°F | Ht 64.0 in | Wt 235.0 lb

## 2013-04-06 DIAGNOSIS — G8929 Other chronic pain: Secondary | ICD-10-CM

## 2013-04-06 DIAGNOSIS — M7062 Trochanteric bursitis, left hip: Secondary | ICD-10-CM

## 2013-04-06 DIAGNOSIS — M76899 Other specified enthesopathies of unspecified lower limb, excluding foot: Secondary | ICD-10-CM

## 2013-04-06 DIAGNOSIS — M549 Dorsalgia, unspecified: Secondary | ICD-10-CM

## 2013-04-06 MED ORDER — TRAZODONE HCL 50 MG PO TABS: 100.0000 mg | ORAL_TABLET | Freq: Every evening | ORAL | Status: DC | PRN

## 2013-04-06 MED ORDER — POLYSACCHARIDE IRON COMPLEX 150 MG PO CAPS: 150.0000 mg | ORAL_CAPSULE | Freq: Every day | ORAL | Status: DC

## 2013-04-06 MED ORDER — PREDNISONE 10 MG PO TABS: ORAL_TABLET | ORAL | Status: DC

## 2013-04-06 NOTE — Telephone Encounter (Signed)
Pt is requesting rx for Trazodone be sent to [facility] for increased amount for a 90 day supply-pt is taking 2 tablets qhs. Pt is also requesting an rx for Nu-Iron to be sent to [facility] as well-dermatologist started her on rx but pt is now requesting PCP fill it-please advise.

## 2013-04-06 NOTE — Assessment & Plan Note (Signed)
Will let Dr. Debby Bud know of your concerns today Will call you if he places a referral for you Continue Soma and Etodolac for now

## 2013-04-06 NOTE — Telephone Encounter (Signed)
Pt called states she is having left hip/leg pain.  Further states is started last night, states she is having trouble walking.  Please advise
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
## 2013-04-06 NOTE — Assessment & Plan Note (Signed)
eRx for pred taper Consider steroid injection directly into bursa if reoccurs

## 2013-04-06 NOTE — Telephone Encounter (Signed)
OK 

## 2013-04-06 NOTE — Patient Instructions (Signed)
Bursitis Bursitis is a swelling and soreness (inflammation) of a fluid-filled sac (bursa) that overlies and protects a joint. It can be caused by injury, overuse of the joint, arthritis or infection. The joints most likely to be affected are the elbows, shoulders, hips and knees. HOME CARE INSTRUCTIONS   Apply ice to the affected area for 15-20 minutes each hour while awake for 2 days. Put the ice in a plastic bag and place a towel between the bag of ice and your skin.  Rest the injured joint as much as possible, but continue to put the joint through a full range of motion, 4 times per day. (The shoulder joint especially becomes rapidly "frozen" if not used.) When the pain lessens, begin normal slow movements and usual activities.  Only take over-the-counter or prescription medicines for pain, discomfort or fever as directed by your caregiver.  Your caregiver may recommend draining the bursa and injecting medicine into the bursa. This may help the healing process.  Follow all instructions for follow-up with your caregiver. This includes any orthopedic referrals, physical therapy and rehabilitation. Any delay in obtaining necessary care could result in a delay or failure of the bursitis to heal and chronic pain. SEEK IMMEDIATE MEDICAL CARE IF:   Your pain increases even during treatment.  You develop an oral temperature above 102 F (38.9 C) and have heat and inflammation over the involved bursa. MAKE SURE YOU:   Understand these instructions.  Will watch your condition.  Will get help right away if you are not doing well or get worse. Document Released: 09/07/2000 Document Revised: 12/03/2011 Document Reviewed: 08/12/2009 [employer] Patient Information 2014 [employer].  

## 2013-04-06 NOTE — Telephone Encounter (Signed)
Rx's sent to [facility] and patient is aware

## 2013-04-07 ENCOUNTER — Encounter: Payer: Self-pay | Admitting: Internal Medicine

## 2013-04-14 ENCOUNTER — Ambulatory Visit (INDEPENDENT_AMBULATORY_CARE_PROVIDER_SITE_OTHER): Payer: Managed Care, Other (non HMO) | Admitting: Internal Medicine

## 2013-04-14 ENCOUNTER — Encounter: Payer: Self-pay | Admitting: Internal Medicine

## 2013-04-14 VITALS — BP 120/70 | HR 76 | Ht 64.0 in | Wt 232.0 lb

## 2013-04-14 DIAGNOSIS — D509 Iron deficiency anemia, unspecified: Secondary | ICD-10-CM

## 2013-04-14 DIAGNOSIS — Z1211 Encounter for screening for malignant neoplasm of colon: Secondary | ICD-10-CM

## 2013-04-14 DIAGNOSIS — D649 Anemia, unspecified: Secondary | ICD-10-CM

## 2013-04-14 MED ORDER — PEG-KCL-NACL-NASULF-NA ASC-C 100 G PO SOLR: 1.0000 | Freq: Once | ORAL | Status: DC

## 2013-04-14 NOTE — Progress Notes (Signed)
Patient ID: Olivia Mosley, female   DOB: 18-Mar-1956, 57 y.o.   MRN: [id number] HPI: [name] is a 57 year old female with a past medical history of iron deficiency anemia, chronic low back pain, type 2 diabetes, hyperlipidemia, hypothyroidism who is seen in consultation at the request of Dr. Debby Bud to evaluate iron deficiency anemia. The patient states that she's had on and off problems with her stomach over the years, specifically as it relates to GERD. She is taking Nexium 40 mg daily which controls her GERD symptoms very well. Occasionally she does have epigastric discomfort which is worse after eating. This also seems to be better on PPI. She is also using ranitidine which he was initially prescribed for itching. She reports she has continued it because she felt like it would also help her stomach. She reports frequent caffeine use. No nausea and vomiting. She does report vague trouble swallowing which is intermittent and mild. She reports she has to be "careful with eating". No history of food impaction. No odynophagia. Bowel habits have been regular with no blood in her stool or melena. She denies diarrhea or constipation. She does recall a left colon partial resection for what sounds like diverticulitis. This was performed in 61 in [place]. No further issues with diverticulitis. No family history of colon cancer.  She does report some easy bruising but no other bleeding including no epistaxis or gingival bleeding. No uterine bleeding  She is currently taking oral iron and feels to be tolerating it well.  Patient Active Problem List   Diagnosis Date Noted  . Trochanteric bursitis of left hip 04/06/2013  . Anemia, iron deficiency 03/10/2013  . Back pain, chronic 03/10/2013  . Insomnia 03/14/2012  . Eczema 03/14/2012  . Routine health maintenance 03/14/2012  . Type II or unspecified type diabetes mellitus without mention of complication, not stated as uncontrolled 01/27/2012  . Hyperlipidemia    . Anxiety   . Arthritis   . Hypothyroidism   . Depression     Past Surgical History  Procedure Laterality Date  . Breast surgery  2010    benign  . Appendectomy      1994  . Colon surgery  1994    bowel resection    Current Outpatient Prescriptions  Medication Sig Dispense Refill  . atorvastatin (LIPITOR) 40 MG tablet TAKE 1 TABLET BY MOUTH EVERY DAY  90 tablet  0  . Azelaic Acid 15 % cream Apply topically daily.  90 g  1  . carisoprodol (SOMA) 350 MG tablet Take 350 mg by mouth daily.      . clobetasol cream (TEMOVATE) 0.05 % Apply topically as needed.  90 g  0  . escitalopram (LEXAPRO) 10 MG tablet TAKE 1 TABLET BY MOUTH DAILY  90 tablet  0  . etodolac (LODINE) 500 MG tablet TAKE 1 TABLET BY MOUTH EVERY DAY  90 tablet  0  . fexofenadine (ALLEGRA) 180 MG tablet TAKE 1 TABLET BY MOUTH DAILY  90 tablet  0  . iron polysaccharides (NU-IRON) 150 MG capsule Take 150 mg by mouth daily.      . iron polysaccharides (NU-IRON) 150 MG capsule Take 1 capsule (150 mg total) by mouth daily.  90 capsule  0  . levothyroxine (SYNTHROID, LEVOTHROID) 75 MCG tablet TAKE 1 TABLET BY MOUTH EVERY DAY  90 tablet  0  . metFORMIN (GLUCOPHAGE) 500 MG tablet Take 1 tablet (500 mg total) by mouth 2 (two) times daily with a meal.  30 tablet  0  . NEXIUM 40 MG capsule TAKE 1 CAPSULE BY MOUTH DAILY (NEED TO SEE DR BEFORE MORE REFILLS)  90 capsule  0  . ranitidine (ZANTAC) 150 MG tablet take 1 tablet by mouth twice a day  60 tablet  5  . traZODone (DESYREL) 50 MG tablet Take 2 tablets (100 mg total) by mouth at bedtime as needed for sleep.  180 tablet  0  . tretinoin (RETIN-A) 0.025 % cream Apply topically at bedtime.  45 g  0  . zolpidem (AMBIEN) 10 MG tablet Take 10 mg by mouth at bedtime as needed.       No current facility-administered medications for this visit.    Allergies  Allergen Reactions  . Flagyl (Metronidazole Hcl) Hives    Family History  Problem Relation Age of Onset  . Depression  Mother   . Stroke Mother   . COPD Mother   . Alcohol abuse Father   . Hyperlipidemia Father   . Heart disease Father   . Hypertension Father   . Breast cancer Sister   . Breast cancer Sister   . Kidney disease Sister   . Colon cancer Neg Hx     History  Substance Use Topics  . Smoking status: Never Smoker   . Smokeless tobacco: Never Used  . Alcohol Use: Yes    ROS: As per history of present illness, otherwise negative  BP 120/70  Pulse 76  Ht 5\' 4"  (1.626 m)  Wt 232 lb (105.235 kg)  BMI 39.8 kg/m2 Constitutional: Well-developed and well-nourished. No distress. HEENT: Normocephalic and atraumatic. Oropharynx is clear and moist. No oropharyngeal exudate. Conjunctivae are normal.  No scleral icterus. Neck: Neck supple. Trachea midline. Cardiovascular: Normal rate, regular rhythm and intact distal pulses. No M/R/G Pulmonary/chest: Effort normal and breath sounds normal. No wheezing, rales or rhonchi. Abdominal: Soft, obese, nontender, nondistended. Bowel sounds active throughout.  Extremities: no clubbing, cyanosis, or edema Lymphadenopathy: No cervical adenopathy noted. Neurological: Alert and oriented to person place and time. Skin: Skin is warm and dry. No rashes noted. Psychiatric: Normal mood and affect. Behavior is normal.  RELEVANT LABS AND IMAGING: CBC    Component Value Date/Time   HGB 11.8* 03/10/2013 1040   HCT 36.2 03/10/2013 1040    CMP     Component Value Date/Time   NA 144 03/10/2013 1040   K 4.8 03/10/2013 1040   CL 110 03/10/2013 1040   CO2 26 03/10/2013 1040   GLUCOSE 73 03/10/2013 1040   BUN 17 03/10/2013 1040   CREATININE 0.6 03/10/2013 1040   CALCIUM 9.6 03/10/2013 1040   PROT 7.0 03/10/2013 1040   PROT 7.0 03/10/2013 1040   ALBUMIN 3.9 03/10/2013 1040   ALBUMIN 3.9 03/10/2013 1040   AST 19 03/10/2013 1040   AST 19 03/10/2013 1040   ALT 24 03/10/2013 1040   ALT 24 03/10/2013 1040   ALKPHOS 69 03/10/2013 1040   ALKPHOS 69 03/10/2013 1040   BILITOT 0.3  03/10/2013 1040   BILITOT 0.3 03/10/2013 1040   Iron/TIBC/Ferritin    Component Value Date/Time   IRON 22* 03/10/2013 1040  % sat - low Ferritin 6  ASSESSMENT/PLAN: 57 year old female with a past medical history of iron deficiency anemia, chronic low back pain, type 2 diabetes, hyperlipidemia, hypothyroidism who is seen in consultation at the request of Dr. Debby Bud to evaluate iron deficiency anemia.  1. Iron def anemia -- given her iron deficiency anemia I recommended upper endoscopy and colonoscopy. She has a history  of GERD and the upper endoscopy will help evaluate her esophagus in light of the GERD and vague dysphagia but also rule out inflammation/PUD.  Colonoscopy for screening and iron deficiency. I will check a celiac panel today as well. She will continue oral iron supplementation, and I have recommended repeating CBC and iron studies in 3 months. We have also discussed that should her upper endoscopy and colonoscopy be negative, the next up would likely be video capsule endoscopy.    Further recommendations after procedures

## 2013-04-14 NOTE — Patient Instructions (Addendum)
You have been scheduled for an endoscopy/colonoscopy with propofol. Please follow written instructions given to you at your visit today. If you use inhalers (even only as needed), please bring them with you on the day of your procedure. Your physician has requested that you go to www.[email or web address] and enter the access code given to you at your visit today. This web site gives a general overview about your procedure. However, you should still follow specific instructions given to you by our office regarding your preparation for the procedure.  Your physician has requested that you go to the basement for the following lab work before leaving today: Celiac Panel We have sent the following medications to your pharmacy for you to pick up at your convenience: Moviprep; you were given instructions today at your office visit                                               We are excited to introduce MyChart, a new best-in-class service that provides you online access to important information in your electronic medical record. We want to make it easier for you to view your health information - all in one secure location - when and where you need it. We expect MyChart will enhance the quality of care and service we provide.  When you register for MyChart, you can:    View your test results.    Request appointments and receive appointment reminders via email.    Request medication renewals.    View your medical history, allergies, medications and immunizations.    Communicate with your physician's office through a password-protected site.    Conveniently print information such as your medication lists.  To find out if MyChart is right for you, please talk to a member of our clinical staff today. We will gladly answer your questions about this free health and wellness tool.  If you are age 57 or older and want a member of your family to have access to your record, you must provide written consent by  completing a proxy form available at our office. Please speak to our clinical staff about guidelines regarding accounts for patients younger than age 78.  As you activate your MyChart account and need any technical assistance, please call the MyChart technical support line at [phone]) or email your question to [email or web address]. If you email your question(s), please include your name, a return phone number and the best time to reach you.  If you have non-urgent health-related questions, you can send a message to our office through MyChart at [email or web address]. If you have a medical emergency, call 911.  Thank you for using MyChart as your new health and wellness resource!   MyChart licensed from [employer],  4540-9811. Patents Pending.

## 2013-04-16 ENCOUNTER — Encounter: Payer: Self-pay | Admitting: Internal Medicine

## 2013-04-16 ENCOUNTER — Telehealth: Payer: Self-pay

## 2013-04-16 DIAGNOSIS — M7062 Trochanteric bursitis, left hip: Secondary | ICD-10-CM

## 2013-04-16 DIAGNOSIS — G8929 Other chronic pain: Secondary | ICD-10-CM

## 2013-04-16 NOTE — Telephone Encounter (Signed)
OK - referral to [facility] for pain mgt/ESI

## 2013-04-16 NOTE — Telephone Encounter (Signed)
Phone call from patient requesting a referral to orthopedics. She changed her mind. Please advise, thanks.

## 2013-04-23 ENCOUNTER — Other Ambulatory Visit: Payer: Self-pay | Admitting: Orthopedic Surgery

## 2013-04-23 DIAGNOSIS — M545 Low back pain, unspecified: Secondary | ICD-10-CM

## 2013-04-26 ENCOUNTER — Other Ambulatory Visit: Payer: Managed Care, Other (non HMO)

## 2013-05-03 ENCOUNTER — Ambulatory Visit
Admission: RE | Admit: 2013-05-03 | Discharge: 2013-05-03 | Disposition: A | Discharge status: Home / Self Care | Payer: Managed Care, Other (non HMO) | Source: Ambulatory Visit | Attending: Orthopedic Surgery | Admitting: Orthopedic Surgery

## 2013-05-03 DIAGNOSIS — M545 Low back pain, unspecified: Secondary | ICD-10-CM

## 2013-05-04 ENCOUNTER — Encounter: Payer: Managed Care, Other (non HMO) | Admitting: Internal Medicine

## 2013-05-06 ENCOUNTER — Ambulatory Visit (AMBULATORY_SURGERY_CENTER): Payer: Managed Care, Other (non HMO) | Admitting: Internal Medicine

## 2013-05-06 ENCOUNTER — Encounter: Payer: Self-pay | Admitting: Internal Medicine

## 2013-05-06 VITALS — BP 135/78 | HR 68 | Temp 97.8°F | Resp 36 | Ht 64.0 in | Wt 232.0 lb

## 2013-05-06 DIAGNOSIS — D509 Iron deficiency anemia, unspecified: Secondary | ICD-10-CM

## 2013-05-06 DIAGNOSIS — Z1211 Encounter for screening for malignant neoplasm of colon: Secondary | ICD-10-CM

## 2013-05-06 DIAGNOSIS — D131 Benign neoplasm of stomach: Secondary | ICD-10-CM

## 2013-05-06 DIAGNOSIS — D1330 Benign neoplasm of unspecified part of small intestine: Secondary | ICD-10-CM

## 2013-05-06 MED ORDER — SODIUM CHLORIDE 0.9 % IV SOLN: 500.0000 mL | INTRAVENOUS | Status: DC

## 2013-05-06 NOTE — Op Note (Signed)
[facility] [address] [place], [address]   COLONOSCOPY PROCEDURE REPORT  PATIENT: Olivia Mosley, Olivia Mosley  MR#: [id number] BIRTHDATE: [birth year] , 57  yrs. old GENDER: Female ENDOSCOPIST: Beverley Fiedler, MD REFERRED XB:[id number] Esther Hardy, M.D. PROCEDURE DATE:  05/06/2013 PROCEDURE:   Colonoscopy, screening First Screening Colonoscopy - Avg.  risk and is 50 yrs.  old or older Yes.  Prior Negative Screening - Now for repeat screening. N/A  History of Adenoma - Now for follow-up colonoscopy & has been > or = to 3 yrs.  N/A  Polyps Removed Today? No.  Recommend repeat exam, <10 yrs? No. ASA CLASS:   Class III INDICATIONS:average risk screening and Iron Deficiency Anemia. MEDICATIONS: MAC sedation, administered by CRNA and Propofol (Diprivan) 140 mg IV  DESCRIPTION OF PROCEDURE:   After the risks benefits and alternatives of the procedure were thoroughly explained, informed consent was obtained.  A digital rectal exam revealed no rectal mass.   The LB [id number]  endoscope was introduced through the anus and advanced to the surgical anastomosis. No adverse events experienced.   The quality of the prep was good, using MoviPrep  The instrument was then slowly withdrawn as the colon was fully examined.   COLON FINDINGS: There was evidence of a normal appearing prior surgical anastomosis in the right colon.  The ascending, hepatic flexure, transverse, splenic flexure, descending, sigmoid colon and rectum appeared unremarkable.  No polyps or cancers were seen. Retroflexed views revealed small internal hemorrhoids. The time to cecum=2 minutes 50 seconds.  Withdrawal time=7 minutes 10 seconds. The scope was withdrawn and the procedure completed.  COMPLICATIONS: There were no complications.  ENDOSCOPIC IMPRESSION: 1.   There was evidence of prior surgical anastomosis in the left colon 2.   Normal colon  RECOMMENDATIONS: You should continue to follow colorectal  cancer screening guidelines for "routine risk" patients with a repeat colonoscopy in 10 years. There is no need for FOBT (stool) testing for at least 5 years.   eSigned:  Beverley Fiedler, MD 05/06/2013 4:51 PM   cc: The Patient and Jacques Navy, MD

## 2013-05-06 NOTE — Progress Notes (Signed)
Called to room to assist during endoscopic procedure.  Patient ID and intended procedure confirmed with present staff. Received instructions for my participation in the procedure from the performing physician.  

## 2013-05-06 NOTE — Progress Notes (Signed)
Pt stable to PACU 

## 2013-05-06 NOTE — Patient Instructions (Signed)
YOU HAD AN ENDOSCOPIC PROCEDURE TODAY AT THE [facility]: Refer to the procedure report that was given to you for any specific questions about what was found during the examination.  If the procedure report does not answer your questions, please call your gastroenterologist to clarify.  If you requested that your care partner not be given the details of your procedure findings, then the procedure report has been included in a sealed envelope for you to review at your convenience later.  YOU SHOULD EXPECT: Some feelings of bloating in the abdomen. Passage of more gas than usual.  Walking can help get rid of the air that was put into your GI tract during the procedure and reduce the bloating. If you had a lower endoscopy (such as a colonoscopy or flexible sigmoidoscopy) you may notice spotting of blood in your stool or on the toilet paper. If you underwent a bowel prep for your procedure, then you may not have a normal bowel movement for a few days.  DIET: Your first meal following the procedure should be a light meal and then it is ok to progress to your normal diet.  A half-sandwich or bowl of soup is an example of a good first meal.  Heavy or fried foods are harder to digest and may make you feel nauseous or bloated.  Likewise meals heavy in dairy and vegetables can cause extra gas to form and this can also increase the bloating.  Drink plenty of fluids but you should avoid alcoholic beverages for 24 hours.  ACTIVITY: Your care partner should take you home directly after the procedure.  You should plan to take it easy, moving slowly for the rest of the day.  You can resume normal activity the day after the procedure however you should NOT DRIVE or use heavy machinery for 24 hours (because of the sedation medicines used during the test).    SYMPTOMS TO REPORT IMMEDIATELY: A gastroenterologist can be reached at any hour.  During normal business hours, 8:30 AM to 5:00 PM Monday through Friday,  call [phone].  After hours and on weekends, please call the GI answering service at [phone] who will take a message and have the physician on call contact you.   Following lower endoscopy (colonoscopy or flexible sigmoidoscopy):  Excessive amounts of blood in the stool  Significant tenderness or worsening of abdominal pains  Swelling of the abdomen that is new, acute  Fever of 100F or higher  Following upper endoscopy (EGD)  Vomiting of blood or coffee ground material  New chest pain or pain under the shoulder blades  Painful or persistently difficult swallowing  New shortness of breath  Fever of 100F or higher  Black, tarry-looking stools  FOLLOW UP: If any biopsies were taken you will be contacted by phone or by letter within the next 1-3 weeks.  Call your gastroenterologist if you have not heard about the biopsies in 3 weeks.  Our staff will call the home number listed on your records the next business day following your procedure to check on you and address any questions or concerns that you may have at that time regarding the information given to you following your procedure. This is a courtesy call and so if there is no answer at the home number and we have not heard from you through the emergency physician on call, we will assume that you have returned to your regular daily activities without incident.  SIGNATURES/CONFIDENTIALITY: You and/or your care   partner have signed paperwork which will be entered into your electronic medical record.  These signatures attest to the fact that that the information above on your After Visit Summary has been reviewed and is understood.  Full responsibility of the confidentiality of this discharge information lies with you and/or your care-partner.    Await biopsy results.  

## 2013-05-06 NOTE — Op Note (Signed)
[facility] [address] [place], [address]   ENDOSCOPY PROCEDURE REPORT  PATIENT: Olivia, Mosley  MR#: [id number] BIRTHDATE: 09-23-1956 , 57  yrs. old GENDER: Female ENDOSCOPIST: Beverley Fiedler, MD REFERRED BY:  Jacques Navy, M.D. PROCEDURE DATE:  05/06/2013 PROCEDURE:  EGD w/ biopsy ASA CLASS:     Class III INDICATIONS:  Iron deficiency anemia. MEDICATIONS: MAC sedation, administered by CRNA, Propofol (Diprivan), and Propofol (Diprivan) 140 mg IV TOPICAL ANESTHETIC: Cetacaine Spray  DESCRIPTION OF PROCEDURE: After the risks benefits and alternatives of the procedure were thoroughly explained, informed consent was obtained.  The LB [id number] endoscope was introduced through the mouth and advanced to the second portion of the duodenum. Without limitations.  The instrument was slowly withdrawn as the mucosa was fully examined.   ESOPHAGUS: An irregular Z-line was observed 36 cm from the incisors. Multiple biopsies were performed using cold forceps.  Sample obtained to rule out Barrett's esophagus.   The mucosa of the esophagus appeared otherwise normal.  STOMACH: 2 polyps, likely fundic gland in nature,measuring 4-5 mm in size was found in the gastric body.  Multiple biopsies were performed using cold forceps.   There was mild antral gastropathy noted.  Cold forcep biopsies were taken at the antrum and angularis.  DUODENUM: The duodenal mucosa showed no abnormalities in the bulb and second portion of the duodenum.  Retroflexed views revealed no abnormalities.     The scope was then withdrawn from the patient and the procedure completed.  COMPLICATIONS: There were no complications.  ENDOSCOPIC IMPRESSION: 1.   Irregular Z-line was observed 36 cm from the incisors; multiple biopsies 2.   The mucosa of the esophagus appeared otherwise normal 3.   2 gastric polyps measuring 4-5 mm in size was found in the gastric body; multiple biopsies 4.    There was mild antral gastropathy noted; multiple biopsies obtained to exclude H. pylori 5.   The duodenal mucosa showed no abnormalities in the bulb and second portion of the duodenum  RECOMMENDATIONS: 1.  Await biopsy results 2.  Proceed with a Colonoscopy.  eSigned:  Beverley Fiedler, MD 05/06/2013 4:33 PM CC:The Patient and Jacques Navy, MD

## 2013-05-06 NOTE — Progress Notes (Signed)
Patient did not experience any of the following events: a burn prior to discharge; a fall within the facility; wrong site/side/patient/procedure/implant event; or a hospital transfer or hospital admission upon discharge from the facility. (G8907) Patient did not have preoperative order for IV antibiotic SSI prophylaxis. (G8918)  

## 2013-05-07 ENCOUNTER — Telehealth: Payer: Self-pay | Admitting: *Deleted

## 2013-05-07 NOTE — Telephone Encounter (Signed)
  Follow up Call-  Call back number 05/06/2013  Post procedure Call Back phone  # [phone]  Permission to leave phone message Yes    [facility]

## 2013-05-08 ENCOUNTER — Other Ambulatory Visit: Payer: Self-pay | Admitting: *Deleted

## 2013-05-08 MED ORDER — POLYSACCHARIDE IRON COMPLEX 150 MG PO CAPS: 150.0000 mg | ORAL_CAPSULE | Freq: Every day | ORAL | Status: DC

## 2013-05-11 ENCOUNTER — Other Ambulatory Visit: Payer: Self-pay | Admitting: Internal Medicine

## 2013-05-13 ENCOUNTER — Encounter: Payer: Self-pay | Admitting: Internal Medicine

## 2013-05-20 ENCOUNTER — Telehealth: Payer: Self-pay | Admitting: *Deleted

## 2013-05-20 NOTE — Telephone Encounter (Signed)
Please arrange video capsule endo for IDA  Thanks   Called pt to schedule a Pill Endo; pt on vacation at [facility], she will call me next week.

## 2013-05-22 ENCOUNTER — Other Ambulatory Visit: Payer: Self-pay | Admitting: Internal Medicine

## 2013-05-26 NOTE — Telephone Encounter (Signed)
Pt will come for teaching on 05/29/13 at 2pm.

## 2013-05-29 ENCOUNTER — Telehealth: Payer: Self-pay | Admitting: *Deleted

## 2013-05-29 NOTE — Telephone Encounter (Signed)
Pt in today for Capsule Teaching and she was shown everything with the belt; did not realize another pt was scheduled that will be connected upstairs with the pill placed endoscopically. Pt stated understanding with the prep and the teaching. Pt will use 1/2 of the Movi Prep as a prep because she had an extra one. Called pt back after I realized the belt would not be available, but she did not want to r/s. Pt will come in 06/01/13 at 0800am.

## 2013-06-01 ENCOUNTER — Ambulatory Visit (INDEPENDENT_AMBULATORY_CARE_PROVIDER_SITE_OTHER): Payer: Managed Care, Other (non HMO) | Admitting: Internal Medicine

## 2013-06-01 DIAGNOSIS — D509 Iron deficiency anemia, unspecified: Secondary | ICD-10-CM

## 2013-06-01 NOTE — Progress Notes (Signed)
Pt in this am around 8am and states she did the prep as ordered, had good results, and has been NPO. She was able to swallow the pill capsule without any difficulty and the computer module was attached to the 8 leads of the connector. Pt lay on her side for > 30 minutes before leaving for the morning. She had no complaints or problems. She was given instruction for the day on diet, safety, etc.   LOT 2014-22/25507S    25 Pt arrived back after 8 hours and states she had no problems. She followed the diet and other instructions. Equipment was removed from the pt and the module was connected to the station. Informed pt it may take up to 2 weeks for results. Pt stated understanding.

## 2013-06-05 ENCOUNTER — Telehealth: Payer: Self-pay | Admitting: *Deleted

## 2013-06-05 DIAGNOSIS — D509 Iron deficiency anemia, unspecified: Secondary | ICD-10-CM

## 2013-06-05 NOTE — Telephone Encounter (Signed)
Informed pt of her capsule results and Dr Rhea Belton would like for her to continue oral iron and repeat labs in October; pt stated understanding.

## 2013-06-06 ENCOUNTER — Encounter: Payer: Self-pay | Admitting: Internal Medicine

## 2013-06-15 ENCOUNTER — Encounter: Payer: Self-pay | Admitting: Internal Medicine

## 2013-07-06 ENCOUNTER — Telehealth: Payer: Self-pay | Admitting: *Deleted

## 2013-07-06 NOTE — Telephone Encounter (Signed)
Message copied by Florene Glen on Mon Jul 06, 2013  8:22 AM ------      Message from: Florene Glen      Created: Fri Jun 05, 2013  4:10 PM       Call to repeat cbc and iron studies tibc, ferritin, %sat in october ------

## 2013-07-07 ENCOUNTER — Telehealth: Payer: Self-pay | Admitting: *Deleted

## 2013-07-07 NOTE — Telephone Encounter (Signed)
Message copied by Florene Glen on Tue Jul 07, 2013  4:59 PM ------      Message from: Florene Glen      Created: Fri Jun 05, 2013  4:10 PM       Call to repeat cbc and iron studies tibc, ferritin, %sat in october ------

## 2013-07-08 ENCOUNTER — Encounter: Payer: Self-pay | Admitting: *Deleted

## 2013-07-08 NOTE — Telephone Encounter (Signed)
Mailed pt a letter requesting she come in for labs.

## 2013-07-24 ENCOUNTER — Other Ambulatory Visit (INDEPENDENT_AMBULATORY_CARE_PROVIDER_SITE_OTHER): Payer: Managed Care, Other (non HMO)

## 2013-07-24 DIAGNOSIS — D509 Iron deficiency anemia, unspecified: Secondary | ICD-10-CM

## 2013-07-24 LAB — CBC WITH DIFFERENTIAL/PLATELET
Basophils Absolute: 0.1 10*3/uL (ref 0.0–0.1)
Basophils Relative: 0.6 % (ref 0.0–3.0)
Eosinophils Absolute: 0.2 10*3/uL (ref 0.0–0.7)
Eosinophils Relative: 2.1 % (ref 0.0–5.0)
HCT: 35.7 % — ABNORMAL LOW (ref 36.0–46.0)
Hemoglobin: 11.9 g/dL — ABNORMAL LOW (ref 12.0–15.0)
Lymphocytes Relative: 16.5 % (ref 12.0–46.0)
Lymphs Abs: 1.4 10*3/uL (ref 0.7–4.0)
MCHC: 33.5 g/dL (ref 30.0–36.0)
MCV: 81.8 fl (ref 78.0–100.0)
Monocytes Absolute: 0.5 10*3/uL (ref 0.1–1.0)
Monocytes Relative: 5.5 % (ref 3.0–12.0)
Neutro Abs: 6.6 10*3/uL (ref 1.4–7.7)
Neutrophils Relative %: 75.3 % (ref 43.0–77.0)
Platelets: 284 10*3/uL (ref 150.0–400.0)
RBC: 4.36 Mil/uL (ref 3.87–5.11)
RDW: 17.1 % — ABNORMAL HIGH (ref 11.5–14.6)
WBC: 8.7 10*3/uL (ref 4.5–10.5)

## 2013-07-24 LAB — IRON AND TIBC
%SAT: 8 % — ABNORMAL LOW (ref 20–55)
Iron: 29 ug/dL — ABNORMAL LOW (ref 42–145)
TIBC: 372 ug/dL (ref 250–470)
UIBC: 343 ug/dL (ref 125–400)

## 2013-07-24 LAB — IBC PANEL
Iron: 28 ug/dL — ABNORMAL LOW (ref 42–145)
Saturation Ratios: 7.2 % — ABNORMAL LOW (ref 20.0–50.0)
Transferrin: 276.2 mg/dL (ref 212.0–360.0)

## 2013-07-24 LAB — FERRITIN: Ferritin: 13.8 ng/mL (ref 10.0–291.0)

## 2013-07-27 ENCOUNTER — Other Ambulatory Visit: Payer: Self-pay | Admitting: *Deleted

## 2013-07-27 MED ORDER — POLYSACCHARIDE IRON COMPLEX 150 MG PO CAPS: 150.0000 mg | ORAL_CAPSULE | Freq: Two times a day (BID) | ORAL | Status: DC

## 2013-07-27 NOTE — Telephone Encounter (Signed)
Pt had labs done.

## 2013-07-30 ENCOUNTER — Other Ambulatory Visit: Payer: Self-pay

## 2013-08-04 ENCOUNTER — Other Ambulatory Visit: Payer: Self-pay

## 2013-08-04 MED ORDER — TRAZODONE HCL 50 MG PO TABS: 100.0000 mg | ORAL_TABLET | Freq: Every evening | ORAL | Status: DC | PRN

## 2013-08-04 NOTE — Telephone Encounter (Addendum)
Cancelled the Trazadone script that was sent to [facility] in [place](it should not have gone there) should go to [facility] home delivery

## 2013-08-04 NOTE — Addendum Note (Signed)
Addended by: Lyanne Co R on: 08/04/2013 09:08 AM   Modules accepted: Orders

## 2013-08-25 ENCOUNTER — Ambulatory Visit (INDEPENDENT_AMBULATORY_CARE_PROVIDER_SITE_OTHER): Payer: Managed Care, Other (non HMO) | Admitting: Internal Medicine

## 2013-08-25 ENCOUNTER — Encounter: Payer: Self-pay | Admitting: Internal Medicine

## 2013-08-25 VITALS — BP 120/72 | HR 88 | Temp 98.1°F | Wt 238.8 lb

## 2013-08-25 DIAGNOSIS — J069 Acute upper respiratory infection, unspecified: Secondary | ICD-10-CM

## 2013-08-25 MED ORDER — SULFAMETHOXAZOLE-TMP DS 800-160 MG PO TABS: 1.0000 | ORAL_TABLET | Freq: Two times a day (BID) | ORAL | Status: DC

## 2013-08-25 NOTE — Patient Instructions (Signed)
Upper respiratory infection - lungs are clear. No sinus tenderness. This is most likely viral but could become a bacterial super-infection (as in on top of). The [place] night time contains tylenol, Dextromethorophan - an expectorant and an antihistamine. The Tylenol cold contains tylenol, guafenesin ( a mucolytic) and a decongestant.  Plan  it is fine to continue the over the counter products your are taking.  Will add PHenergan with codine cough syrup  Septra DS twice a day for 5 days.    Upper Respiratory Infection, Adult An upper respiratory infection (URI) is also sometimes known as the common cold. The upper respiratory tract includes the nose, sinuses, throat, trachea, and bronchi. Bronchi are the airways leading to the lungs. Most people improve within 1 week, but symptoms can last up to 2 weeks. A residual cough may last even longer.  CAUSES Many different viruses can infect the tissues lining the upper respiratory tract. The tissues become irritated and inflamed and often become very moist. Mucus production is also common. A cold is contagious. You can easily spread the virus to others by oral contact. This includes kissing, sharing a glass, coughing, or sneezing. Touching your mouth or nose and then touching a surface, which is then touched by another person, can also spread the virus. SYMPTOMS  Symptoms typically develop 1 to 3 days after you come in contact with a cold virus. Symptoms vary from person to person. They may include:  Runny nose.  Sneezing.  Nasal congestion.  Sinus irritation.  Sore throat.  Loss of voice (laryngitis).  Cough.  Fatigue.  Muscle aches.  Loss of appetite.  Headache.  Low-grade fever. DIAGNOSIS  You might diagnose your own cold based on familiar symptoms, since most people get a cold 2 to 3 times a year. Your caregiver can confirm this based on your exam. Most importantly, your caregiver can check that your symptoms are not due to another  disease such as strep throat, sinusitis, pneumonia, asthma, or epiglottitis. Blood tests, throat tests, and X-rays are not necessary to diagnose a common cold, but they may sometimes be helpful in excluding other more serious diseases. Your caregiver will decide if any further tests are required. RISKS AND COMPLICATIONS  You may be at risk for a more severe case of the common cold if you smoke cigarettes, have chronic heart disease (such as heart failure) or lung disease (such as asthma), or if you have a weakened immune system. The very young and very old are also at risk for more serious infections. Bacterial sinusitis, middle ear infections, and bacterial pneumonia can complicate the common cold. The common cold can worsen asthma and chronic obstructive pulmonary disease (COPD). Sometimes, these complications can require emergency medical care and may be life-threatening. PREVENTION  The best way to protect against getting a cold is to practice good hygiene. Avoid oral or hand contact with people with cold symptoms. Wash your hands often if contact occurs. There is no clear evidence that vitamin C, vitamin E, echinacea, or exercise reduces the chance of developing a cold. However, it is always recommended to get plenty of rest and practice good nutrition. TREATMENT  Treatment is directed at relieving symptoms. There is no cure. Antibiotics are not effective, because the infection is caused by a virus, not by bacteria. Treatment may include:  Increased fluid intake. Sports drinks offer valuable electrolytes, sugars, and fluids.  Breathing heated mist or steam (vaporizer or shower).  Eating chicken soup or other clear broths, and maintaining  good nutrition.  Getting plenty of rest.  Using gargles or lozenges for comfort.  Controlling fevers with ibuprofen or acetaminophen as directed by your caregiver.  Increasing usage of your inhaler if you have asthma. Zinc gel and zinc lozenges, taken in  the first 24 hours of the common cold, can shorten the duration and lessen the severity of symptoms. Pain medicines may help with fever, muscle aches, and throat pain. A variety of non-prescription medicines are available to treat congestion and runny nose. Your caregiver can make recommendations and may suggest nasal or lung inhalers for other symptoms.  HOME CARE INSTRUCTIONS   Only take over-the-counter or prescription medicines for pain, discomfort, or fever as directed by your caregiver.  Use a warm mist humidifier or inhale steam from a shower to increase air moisture. This may keep secretions moist and make it easier to breathe.  Drink enough water and fluids to keep your urine clear or pale yellow.  Rest as needed.  Return to work when your temperature has returned to normal or as your caregiver advises. You may need to stay home longer to avoid infecting others. You can also use a face mask and careful hand washing to prevent spread of the virus. SEEK MEDICAL CARE IF:   After the first few days, you feel you are getting worse rather than better.  You need your caregiver's advice about medicines to control symptoms.  You develop chills, worsening shortness of breath, or brown or red sputum. These may be signs of pneumonia.  You develop yellow or brown nasal discharge or pain in the face, especially when you bend forward. These may be signs of sinusitis.  You develop a fever, swollen neck glands, pain with swallowing, or white areas in the back of your throat. These may be signs of strep throat. SEEK IMMEDIATE MEDICAL CARE IF:   You have a fever.  You develop severe or persistent headache, ear pain, sinus pain, or chest pain.  You develop wheezing, a prolonged cough, cough up blood, or have a change in your usual mucus (if you have chronic lung disease).  You develop sore muscles or a stiff neck. Document Released: 03/06/2001 Document Revised: 12/03/2011 Document Reviewed:  01/12/2011 [facility] Patient Information 2014 [place], [place].
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
## 2013-08-25 NOTE — Progress Notes (Signed)
Pre visit review using our clinic review tool, if applicable. No additional management support is needed unless otherwise documented below in the visit note. 

## 2013-09-15 ENCOUNTER — Other Ambulatory Visit: Payer: Self-pay | Admitting: Internal Medicine

## 2013-09-16 ENCOUNTER — Other Ambulatory Visit: Payer: Self-pay | Admitting: Internal Medicine

## 2013-09-18 ENCOUNTER — Telehealth: Payer: Self-pay | Admitting: Internal Medicine

## 2013-09-18 MED ORDER — METFORMIN HCL 500 MG PO TABS: ORAL_TABLET | ORAL | Status: DC

## 2013-09-18 NOTE — Telephone Encounter (Signed)
Notified pt metformin sent to [facility]...lmb

## 2013-09-18 NOTE — Telephone Encounter (Signed)
Pt called stated that she will be running out of her Metformin by 12.30.14 and request for Dr. Debby Bud to send a week supply in to [facility] until the mail order come in. Pt has an appt 1.6.15. Please advise.

## 2013-09-29 ENCOUNTER — Encounter: Payer: Self-pay | Admitting: Internal Medicine

## 2013-09-29 ENCOUNTER — Other Ambulatory Visit (INDEPENDENT_AMBULATORY_CARE_PROVIDER_SITE_OTHER): Payer: Managed Care, Other (non HMO)

## 2013-09-29 ENCOUNTER — Ambulatory Visit (INDEPENDENT_AMBULATORY_CARE_PROVIDER_SITE_OTHER): Payer: Managed Care, Other (non HMO) | Admitting: Internal Medicine

## 2013-09-29 VITALS — BP 116/80 | HR 86 | Temp 97.5°F | Wt 234.4 lb

## 2013-09-29 DIAGNOSIS — E119 Type 2 diabetes mellitus without complications: Secondary | ICD-10-CM

## 2013-09-29 DIAGNOSIS — E785 Hyperlipidemia, unspecified: Secondary | ICD-10-CM

## 2013-09-29 DIAGNOSIS — D509 Iron deficiency anemia, unspecified: Secondary | ICD-10-CM

## 2013-09-29 LAB — IBC PANEL
Iron: 34 ug/dL — ABNORMAL LOW (ref 42–145)
Saturation Ratios: 8.4 % — ABNORMAL LOW (ref 20.0–50.0)
Transferrin: 288.3 mg/dL (ref 212.0–360.0)

## 2013-09-29 LAB — BASIC METABOLIC PANEL
BUN: 24 mg/dL — ABNORMAL HIGH (ref 6–23)
CO2: 24 mEq/L (ref 19–32)
Calcium: 9.4 mg/dL (ref 8.4–10.5)
Chloride: 104 mEq/L (ref 96–112)
Creatinine, Ser: 0.7 mg/dL (ref 0.4–1.2)
GFR: 97.96 mL/min (ref >60.00)
Glucose, Bld: 95 mg/dL (ref 70–99)
Potassium: 4.4 mEq/L (ref 3.5–5.1)
Sodium: 136 mEq/L (ref 135–145)

## 2013-09-29 LAB — HEMOGLOBIN A1C: Hgb A1c MFr Bld: 6.1 % (ref 4.6–6.5)

## 2013-09-29 LAB — VITAMIN B12: Vitamin B-12: 471 pg/mL (ref 211–911)

## 2013-09-29 MED ORDER — FLUTICASONE PROPIONATE 50 MCG/ACT NA SUSP: 1.0000 | Freq: Every day | NASAL | Status: DC

## 2013-09-29 NOTE — Progress Notes (Signed)
Pre visit review using our clinic review tool, if applicable. No additional management support is needed unless otherwise documented below in the visit note. 

## 2013-09-29 NOTE — Patient Instructions (Signed)
1. Iron deficiency - your liver functions have been normal and that is reliable information. Your kidney function is normal, also reliable information and indicates you have plenty of erythropoetin - the hormone that stimulate bone marrow production of blood. GI evaluation has been negative.  Plan Recheck iron levels  Check  B12 level  For continued iron deficiency may need a hematology consult.  2. Allergic rhinitis  -  Your immune system is fighting some kind of enemy leading to collateral damage with mucosal and weeping which give runny, etc. Also can be the cause of a cough - post nasal drainage. There is no sign of infection at today's exam and we are good stewards of antibiotic effective.  Plan Short term - sudafed 30 mg AM and after lunch  Allegra  Long-term flonase 1 spray to each nostril once a day.   3. Health maintenance - well-woman exam , please schedule
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
## 2013-09-30 NOTE — Assessment & Plan Note (Signed)
Last lipid panel in July '14 - normal/good control  Plan Follow up lab July '15

## 2013-09-30 NOTE — Assessment & Plan Note (Signed)
A1C 6.4% - excellent control on single agent therapy.   Plan Continue diet and exercise  Continue metformin  F/u lab 6 months

## 2013-09-30 NOTE — Assessment & Plan Note (Signed)
No report or signs of blood loss. GI evaluation was normal. She has read about erythropoetin but she has normal renal function.  Follow up lab reveals continued low iron, very low iron saturation, normal B12 level  Plan Referral to hematology

## 2013-10-29 ENCOUNTER — Telehealth: Payer: Self-pay | Admitting: Hematology & Oncology

## 2013-10-29 NOTE — Telephone Encounter (Signed)
Left vm w NEW PATIENT today to remind them of their appointment with Dr. Ennever. Also, advised them to bring all meds and insurance information. ° °

## 2013-10-30 ENCOUNTER — Ambulatory Visit: Payer: Managed Care, Other (non HMO)

## 2013-10-30 ENCOUNTER — Ambulatory Visit (HOSPITAL_BASED_OUTPATIENT_CLINIC_OR_DEPARTMENT_OTHER): Payer: Managed Care, Other (non HMO) | Admitting: Hematology & Oncology

## 2013-10-30 ENCOUNTER — Other Ambulatory Visit (HOSPITAL_BASED_OUTPATIENT_CLINIC_OR_DEPARTMENT_OTHER): Payer: Managed Care, Other (non HMO) | Admitting: Lab

## 2013-10-30 ENCOUNTER — Encounter: Payer: Self-pay | Admitting: Hematology & Oncology

## 2013-10-30 ENCOUNTER — Other Ambulatory Visit: Payer: Managed Care, Other (non HMO) | Admitting: Lab

## 2013-10-30 ENCOUNTER — Ambulatory Visit: Payer: Managed Care, Other (non HMO) | Admitting: Hematology & Oncology

## 2013-10-30 VITALS — BP 133/69 | HR 81 | Temp 98.0°F | Resp 14 | Ht 63.0 in | Wt 231.0 lb

## 2013-10-30 DIAGNOSIS — E119 Type 2 diabetes mellitus without complications: Secondary | ICD-10-CM

## 2013-10-30 DIAGNOSIS — D509 Iron deficiency anemia, unspecified: Secondary | ICD-10-CM

## 2013-10-30 DIAGNOSIS — Z862 Personal history of diseases of the blood and blood-forming organs and certain disorders involving the immune mechanism: Secondary | ICD-10-CM

## 2013-10-30 DIAGNOSIS — N189 Chronic kidney disease, unspecified: Secondary | ICD-10-CM

## 2013-10-30 DIAGNOSIS — E039 Hypothyroidism, unspecified: Secondary | ICD-10-CM

## 2013-10-30 LAB — CBC WITH DIFFERENTIAL (CANCER CENTER ONLY)
BASO#: 0 10*3/uL (ref 0.0–0.2)
BASO%: 0.2 % (ref 0.0–2.0)
EOS%: 1 % (ref 0.0–7.0)
Eosinophils Absolute: 0.1 10*3/uL (ref 0.0–0.5)
HCT: 40.6 % (ref 34.8–46.6)
HGB: 13.1 g/dL (ref 11.6–15.9)
LYMPH#: 1.6 10*3/uL (ref 0.9–3.3)
LYMPH%: 15.7 % (ref 14.0–48.0)
MCH: 27.5 pg (ref 26.0–34.0)
MCHC: 32.3 g/dL (ref 32.0–36.0)
MCV: 85 fL (ref 81–101)
MONO#: 0.9 10*3/uL (ref 0.1–0.9)
MONO%: 8.7 % (ref 0.0–13.0)
NEUT#: 7.4 10*3/uL — ABNORMAL HIGH (ref 1.5–6.5)
NEUT%: 74.4 % (ref 39.6–80.0)
Platelets: 372 10*3/uL (ref 145–400)
RBC: 4.77 10*6/uL (ref 3.70–5.32)
RDW: 15.2 % (ref 11.1–15.7)
WBC: 10 10*3/uL (ref 3.9–10.0)

## 2013-10-30 LAB — CHCC SATELLITE - SMEAR
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
## 2013-11-02 LAB — IRON AND TIBC CHCC
%SAT: 19 % — ABNORMAL LOW (ref 21–57)
Iron: 73 ug/dL (ref 41–142)
TIBC: 388 ug/dL (ref 236–444)
UIBC: 314 ug/dL (ref 120–384)

## 2013-11-02 LAB — FERRITIN CHCC: Ferritin: 20 ng/ml (ref 9–269)

## 2013-11-03 ENCOUNTER — Telehealth: Payer: Self-pay | Admitting: Hematology & Oncology

## 2013-11-03 ENCOUNTER — Telehealth: Payer: Self-pay | Admitting: *Deleted

## 2013-11-03 ENCOUNTER — Ambulatory Visit: Payer: Managed Care, Other (non HMO) | Admitting: Internal Medicine

## 2013-11-03 LAB — HEMOGLOBINOPATHY EVALUATION
Hemoglobin Other: 0 %
Hgb A2 Quant: 2.3 % (ref 2.2–3.2)
Hgb A: 97.7 % (ref 96.8–97.8)
Hgb F Quant: 0 % (ref 0.0–2.0)
Hgb S Quant: 0 %

## 2013-11-03 LAB — RETICULOCYTES (CHCC)
ABS Retic: 57.2 10*3/uL (ref 19.0–186.0)
RBC.: 4.77 MIL/uL (ref 3.87–5.11)
Retic Ct Pct: 1.2 % (ref 0.4–2.3)

## 2013-11-03 LAB — ERYTHROPOIETIN: Erythropoietin: 15 m[IU]/mL (ref 2.6–18.5)

## 2013-11-03 NOTE — Telephone Encounter (Signed)
lmom for pt to call back. However, pt saw Dr Marin Olp at [facility] and the same labs were drawn. Dr Marin Olp mentions giving her IV Iron. Called back back and left a message not to call d.t we will use labs drawn at the [facility]. Dr Hilarie Fredrickson, please review labs and advise. Thanks.

## 2013-11-03 NOTE — Telephone Encounter (Signed)
Left message for pt to call and schedule several appointments

## 2013-11-03 NOTE — Telephone Encounter (Signed)
Message copied by Lance Morin on Tue Nov 03, 2013 10:04 AM ------      Message from: Lance Morin      Created: Mon Jul 27, 2013  4:48 PM       Repeat iron studies in 3 months after consistently taking iron x 3 months ------

## 2013-11-04 ENCOUNTER — Telehealth: Payer: Self-pay | Admitting: Hematology & Oncology

## 2013-11-04 NOTE — Telephone Encounter (Signed)
Left message to call for appointment, one of them I need to schedule soon.

## 2013-11-05 ENCOUNTER — Telehealth: Payer: Self-pay | Admitting: Hematology & Oncology

## 2013-11-05 NOTE — Telephone Encounter (Signed)
Noted and and routed to Dr Marin Olp.

## 2013-11-05 NOTE — Telephone Encounter (Signed)
Left message to please call me.

## 2013-11-05 NOTE — Telephone Encounter (Signed)
I will defer to Dr. Marin Olp for treatment recommendations for her anemia She can followup with me as needed

## 2013-11-06 ENCOUNTER — Encounter: Payer: Self-pay | Admitting: Nurse Practitioner

## 2013-11-06 ENCOUNTER — Telehealth: Payer: Self-pay | Admitting: Hematology & Oncology

## 2013-11-06 NOTE — Telephone Encounter (Signed)
Pt aware of 2-17 iron and 3-20 MD

## 2013-11-06 NOTE — Telephone Encounter (Signed)
RN aware I have left 3 messages for pt to call for appointments, iron and MD

## 2013-11-06 NOTE — Telephone Encounter (Signed)
Pt called talked to RN gave her new phone number. I called new number to schedule appointments no answer or voice mail set up. RN aware

## 2013-11-09 ENCOUNTER — Ambulatory Visit: Payer: Managed Care, Other (non HMO) | Admitting: Internal Medicine

## 2013-11-09 ENCOUNTER — Other Ambulatory Visit: Payer: Self-pay | Admitting: *Deleted

## 2013-11-09 ENCOUNTER — Telehealth: Payer: Self-pay | Admitting: *Deleted

## 2013-11-09 MED ORDER — ALPRAZOLAM 0.5 MG PO TABS: 0.5000 mg | ORAL_TABLET | Freq: Three times a day (TID) | ORAL | Status: DC | PRN

## 2013-11-09 NOTE — Telephone Encounter (Signed)
Notified patient of order and MD recommendation.  States her and spouse are already going to a Social worker.

## 2013-11-09 NOTE — Telephone Encounter (Signed)
[facility] for xanax 0.5 mg tid prn #90. Needs to call [facility] for an appointment for post traumatic stress syndrome [phone]
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
## 2013-11-10 ENCOUNTER — Ambulatory Visit: Payer: Managed Care, Other (non HMO)

## 2013-11-10 ENCOUNTER — Encounter: Payer: Self-pay | Admitting: Internal Medicine

## 2013-11-11 ENCOUNTER — Ambulatory Visit: Payer: Managed Care, Other (non HMO) | Admitting: Internal Medicine

## 2013-11-13 ENCOUNTER — Ambulatory Visit (HOSPITAL_BASED_OUTPATIENT_CLINIC_OR_DEPARTMENT_OTHER): Payer: Managed Care, Other (non HMO)

## 2013-11-13 VITALS — BP 110/73 | HR 98 | Temp 97.6°F | Resp 18

## 2013-11-13 DIAGNOSIS — D509 Iron deficiency anemia, unspecified: Secondary | ICD-10-CM

## 2013-11-13 MED ORDER — SODIUM CHLORIDE 0.9 % IV SOLN
Freq: Once | INTRAVENOUS | Status: AC
Start: 2013-11-13 — End: 2013-11-13
  Administered 2013-11-13: 10:00:00 via INTRAVENOUS

## 2013-11-13 MED ORDER — FERUMOXYTOL INJECTION 510 MG/17 ML
1020.0000 mg | Freq: Once | INTRAVENOUS | Status: AC
  Administered 2013-11-13: 1020 mg via INTRAVENOUS
  Filled 2013-11-13: qty 34

## 2013-11-13 NOTE — Patient Instructions (Signed)

## 2013-12-06 ENCOUNTER — Other Ambulatory Visit: Payer: Self-pay | Admitting: Internal Medicine

## 2013-12-10 ENCOUNTER — Telehealth: Payer: Self-pay | Admitting: Hematology & Oncology

## 2013-12-10 NOTE — Telephone Encounter (Signed)
Pt left message cx 3-20. I left her message to call and reschedule

## 2013-12-11 ENCOUNTER — Other Ambulatory Visit: Payer: Managed Care, Other (non HMO) | Admitting: Lab

## 2013-12-11 ENCOUNTER — Ambulatory Visit: Payer: Managed Care, Other (non HMO) | Admitting: Hematology & Oncology

## 2013-12-15 ENCOUNTER — Ambulatory Visit: Payer: Managed Care, Other (non HMO) | Admitting: Internal Medicine

## 2013-12-29 ENCOUNTER — Telehealth: Payer: Self-pay | Admitting: Hematology & Oncology

## 2013-12-29 ENCOUNTER — Telehealth: Payer: Self-pay | Admitting: *Deleted

## 2013-12-29 MED ORDER — ALPRAZOLAM 0.5 MG PO TABS: 0.5000 mg | ORAL_TABLET | Freq: Three times a day (TID) | ORAL | Status: DC | PRN

## 2013-12-29 NOTE — Telephone Encounter (Signed)
Pt made 4-30 appointment from 3-20 cx she needed afternoons

## 2013-12-29 NOTE — Telephone Encounter (Signed)
Rx written for #25

## 2013-12-29 NOTE — Telephone Encounter (Signed)
Spoke with pt, Rx faxed to [facility] on [address].

## 2013-12-29 NOTE — Telephone Encounter (Signed)
Pt called requesting Xanax refill.  Pt has NP appoint on 4.22.15.  Please advise

## 2014-01-13 ENCOUNTER — Encounter: Payer: Self-pay | Admitting: Internal Medicine

## 2014-01-13 ENCOUNTER — Ambulatory Visit (INDEPENDENT_AMBULATORY_CARE_PROVIDER_SITE_OTHER): Payer: Managed Care, Other (non HMO) | Admitting: Internal Medicine

## 2014-01-13 ENCOUNTER — Other Ambulatory Visit (INDEPENDENT_AMBULATORY_CARE_PROVIDER_SITE_OTHER): Payer: Managed Care, Other (non HMO)

## 2014-01-13 VITALS — BP 110/70 | HR 92 | Temp 97.5°F | Resp 16 | Ht 64.0 in | Wt 233.0 lb

## 2014-01-13 DIAGNOSIS — F411 Generalized anxiety disorder: Secondary | ICD-10-CM

## 2014-01-13 DIAGNOSIS — G4700 Insomnia, unspecified: Secondary | ICD-10-CM

## 2014-01-13 DIAGNOSIS — E039 Hypothyroidism, unspecified: Secondary | ICD-10-CM

## 2014-01-13 DIAGNOSIS — J019 Acute sinusitis, unspecified: Secondary | ICD-10-CM | POA: Insufficient documentation

## 2014-01-13 DIAGNOSIS — Z23 Encounter for immunization: Secondary | ICD-10-CM

## 2014-01-13 DIAGNOSIS — R7309 Other abnormal glucose: Secondary | ICD-10-CM

## 2014-01-13 DIAGNOSIS — F419 Anxiety disorder, unspecified: Secondary | ICD-10-CM

## 2014-01-13 LAB — BASIC METABOLIC PANEL
BUN: 17 mg/dL (ref 6–23)
CO2: 28 mEq/L (ref 19–32)
Calcium: 9.8 mg/dL (ref 8.4–10.5)
Chloride: 101 mEq/L (ref 96–112)
Creatinine, Ser: 0.6 mg/dL (ref 0.4–1.2)
GFR: 103.25 mL/min (ref >60.00)
Glucose, Bld: 97 mg/dL (ref 70–99)
Potassium: 4.2 mEq/L (ref 3.5–5.1)
Sodium: 137 mEq/L (ref 135–145)

## 2014-01-13 LAB — HEMOGLOBIN A1C: Hgb A1c MFr Bld: 5.6 % (ref 4.6–6.5)

## 2014-01-13 LAB — TSH: TSH: 1.79 u[IU]/mL (ref 0.35–5.50)

## 2014-01-13 MED ORDER — AMOXICILLIN 875 MG PO TABS: 875.0000 mg | ORAL_TABLET | Freq: Two times a day (BID) | ORAL | Status: DC

## 2014-01-13 MED ORDER — ALPRAZOLAM 0.5 MG PO TABS: 0.5000 mg | ORAL_TABLET | Freq: Three times a day (TID) | ORAL | Status: DC | PRN

## 2014-01-13 NOTE — Patient Instructions (Signed)

## 2014-01-13 NOTE — Progress Notes (Signed)
Pre visit review using our clinic review tool, if applicable. No additional management support is needed unless otherwise documented below in the visit note. 

## 2014-01-13 NOTE — Progress Notes (Signed)
Subjective:    Patient ID: Olivia Mosley, female    DOB: September 26, 1955, 58 y.o.   MRN: [id number]  Sinusitis This is a new problem. The current episode started 1 to 4 weeks ago. The problem has been gradually worsening since onset. There has been no fever. The fever has been present for less than 1 day. Her pain is at a severity of 0/10. She is experiencing no pain. Associated symptoms include sinus pressure, sneezing and a sore throat. Pertinent negatives include no chills, congestion, coughing, diaphoresis, ear pain, headaches, hoarse voice, neck pain, shortness of breath or swollen glands. Past treatments include nothing. The treatment provided no relief.      Review of Systems  Constitutional: Negative.  Negative for fever, chills, diaphoresis, appetite change and fatigue.  HENT: Positive for postnasal drip, rhinorrhea, sinus pressure, sneezing and sore throat. Negative for congestion, ear pain, hoarse voice and nosebleeds.   Eyes: Negative.   Respiratory: Negative.  Negative for cough, choking, shortness of breath, wheezing and stridor.   Cardiovascular: Negative.  Negative for chest pain, palpitations and leg swelling.  Gastrointestinal: Negative.  Negative for nausea, vomiting, abdominal pain, diarrhea, constipation and blood in stool.  Endocrine: Negative.   Genitourinary: Negative.   Musculoskeletal: Negative.  Negative for arthralgias, back pain, gait problem, joint swelling, myalgias, neck pain and neck stiffness.  Skin: Negative.   Allergic/Immunologic: Negative.   Neurological: Negative.  Negative for dizziness, tremors, weakness, light-headedness, numbness and headaches.  Hematological: Negative.  Negative for adenopathy. Does not bruise/bleed easily.  Psychiatric/Behavioral: Positive for sleep disturbance and dysphoric mood. Negative for suicidal ideas, hallucinations, behavioral problems, confusion, self-injury, decreased concentration and agitation. The patient is  nervous/anxious.        Objective:   Physical Exam  Vitals reviewed. Constitutional: She is oriented to person, place, and time. She appears well-developed and well-nourished. No distress.  HENT:  Head: Normocephalic and atraumatic.  Right Ear: Hearing, tympanic membrane, external ear and ear canal normal.  Left Ear: Hearing, tympanic membrane, external ear and ear canal normal.  Nose: Rhinorrhea present. No mucosal edema or sinus tenderness. Right sinus exhibits maxillary sinus tenderness. Right sinus exhibits no frontal sinus tenderness. Left sinus exhibits maxillary sinus tenderness. Left sinus exhibits no frontal sinus tenderness.  Mouth/Throat: Oropharynx is clear and moist and mucous membranes are normal. Mucous membranes are not pale, not dry and not cyanotic. No oral lesions. No trismus in the jaw. No uvula swelling. No oropharyngeal exudate, posterior oropharyngeal edema, posterior oropharyngeal erythema or tonsillar abscesses.  Eyes: Conjunctivae are normal. Right eye exhibits no discharge. Left eye exhibits no discharge. No scleral icterus.  Neck: Normal range of motion. Neck supple. No JVD present. No tracheal deviation present. No thyromegaly present.  Cardiovascular: Normal rate, regular rhythm, normal heart sounds and intact distal pulses.  Exam reveals no gallop and no friction rub.   No murmur heard. Pulmonary/Chest: Effort normal and breath sounds normal. No stridor. No respiratory distress. She has no wheezes. She has no rales. She exhibits no tenderness.  Abdominal: Soft. Bowel sounds are normal. She exhibits no distension and no mass. There is no tenderness. There is no rebound and no guarding.  Musculoskeletal: Normal range of motion. She exhibits no edema and no tenderness.  Lymphadenopathy:    She has no cervical adenopathy.  Neurological: She is oriented to person, place, and time.  Skin: Skin is warm and dry. No rash noted. She is not diaphoretic. No erythema. No  pallor.  Psychiatric:  She has a normal mood and affect. Her behavior is normal. Judgment and thought content normal.     Lab Results  Component Value Date   WBC 10.0 10/30/2013   HGB 13.1 10/30/2013   HCT 40.6 10/30/2013   PLT 372 10/30/2013   GLUCOSE 95 09/29/2013   CHOL 159 03/10/2013   TRIG 140.0 03/10/2013   HDL 53.30 03/10/2013   LDLCALC 78 03/10/2013   ALT 24 03/10/2013   ALT 24 03/10/2013   AST 19 03/10/2013   AST 19 03/10/2013   NA 136 09/29/2013   K 4.4 09/29/2013   CL 104 09/29/2013   CREATININE 0.7 09/29/2013   BUN 24* 09/29/2013   CO2 24 09/29/2013   TSH 2.45 03/10/2013   HGBA1C 6.1 09/29/2013       Assessment & Plan:

## 2014-01-14 NOTE — Assessment & Plan Note (Signed)
I will treat the infection with amoxil 

## 2014-01-14 NOTE — Assessment & Plan Note (Signed)
Cont xanax as needed 

## 2014-01-14 NOTE — Assessment & Plan Note (Signed)
She will stop metformin for now since her A1C was overcorrected

## 2014-01-14 NOTE — Assessment & Plan Note (Signed)
Her TSH is in the norma range so she will stay on the current dose

## 2014-01-19 ENCOUNTER — Telehealth: Payer: Self-pay | Admitting: Hematology & Oncology

## 2014-01-19 NOTE — Telephone Encounter (Signed)
Patient called and cx 01/21/14 apt and resch for 02/12/14

## 2014-01-21 ENCOUNTER — Ambulatory Visit: Payer: Managed Care, Other (non HMO) | Admitting: Hematology & Oncology

## 2014-01-21 ENCOUNTER — Other Ambulatory Visit: Payer: Managed Care, Other (non HMO) | Admitting: Lab

## 2014-01-26 ENCOUNTER — Ambulatory Visit: Payer: Managed Care, Other (non HMO) | Admitting: Physician Assistant

## 2014-02-12 ENCOUNTER — Ambulatory Visit: Payer: Managed Care, Other (non HMO) | Admitting: Hematology & Oncology

## 2014-02-12 ENCOUNTER — Other Ambulatory Visit: Payer: Managed Care, Other (non HMO) | Admitting: Lab

## 2014-02-12 ENCOUNTER — Telehealth: Payer: Self-pay | Admitting: Hematology & Oncology

## 2014-02-12 NOTE — Telephone Encounter (Signed)
Pt was late for appointment rescheduled for 6-26. She is aware she can leave message with RN if she doesn't feel well.

## 2014-03-19 ENCOUNTER — Ambulatory Visit (HOSPITAL_BASED_OUTPATIENT_CLINIC_OR_DEPARTMENT_OTHER): Payer: Managed Care, Other (non HMO) | Admitting: Lab

## 2014-03-19 ENCOUNTER — Ambulatory Visit (HOSPITAL_BASED_OUTPATIENT_CLINIC_OR_DEPARTMENT_OTHER): Payer: Managed Care, Other (non HMO) | Admitting: Hematology & Oncology

## 2014-03-19 VITALS — BP 129/79 | HR 74 | Temp 98.6°F | Resp 16 | Wt 232.0 lb

## 2014-03-19 DIAGNOSIS — E039 Hypothyroidism, unspecified: Secondary | ICD-10-CM

## 2014-03-19 DIAGNOSIS — D509 Iron deficiency anemia, unspecified: Secondary | ICD-10-CM

## 2014-03-19 LAB — CBC WITH DIFFERENTIAL (CANCER CENTER ONLY)
BASO#: 0 10*3/uL (ref 0.0–0.2)
BASO%: 0.4 % (ref 0.0–2.0)
EOS%: 3 % (ref 0.0–7.0)
Eosinophils Absolute: 0.2 10*3/uL (ref 0.0–0.5)
HCT: 40.4 % (ref 34.8–46.6)
HGB: 13.7 g/dL (ref 11.6–15.9)
LYMPH#: 1.6 10*3/uL (ref 0.9–3.3)
LYMPH%: 20.3 % (ref 14.0–48.0)
MCH: 30.7 pg (ref 26.0–34.0)
MCHC: 33.9 g/dL (ref 32.0–36.0)
MCV: 91 fL (ref 81–101)
MONO#: 0.5 10*3/uL (ref 0.1–0.9)
MONO%: 7.1 % (ref 0.0–13.0)
NEUT#: 5.3 10*3/uL (ref 1.5–6.5)
NEUT%: 69.2 % (ref 39.6–80.0)
Platelets: 286 10*3/uL (ref 145–400)
RBC: 4.46 10*6/uL (ref 3.70–5.32)
RDW: 13.2 % (ref 11.1–15.7)
WBC: 7.7 10*3/uL (ref 3.9–10.0)

## 2014-03-19 LAB — CHCC SATELLITE - SMEAR

## 2014-03-19 LAB — RETICULOCYTES (CHCC)
ABS Retic: 85.7 10*3/uL (ref 19.0–186.0)
RBC.: 4.51 MIL/uL (ref 3.87–5.11)
Retic Ct Pct: 1.9 % (ref 0.4–2.3)

## 2014-03-22 LAB — IRON AND TIBC CHCC
%SAT: 26 % (ref 21–57)
Iron: 78 ug/dL (ref 41–142)
TIBC: 294 ug/dL (ref 236–444)
UIBC: 216 ug/dL (ref 120–384)

## 2014-03-22 LAB — FERRITIN CHCC: Ferritin: 211 ng/ml (ref 9–269)

## 2014-03-22 LAB — TSH CHCC: TSH: 1.969 m(IU)/L (ref 0.308–3.960)

## 2014-03-22 NOTE — Progress Notes (Signed)
Hematology and Oncology Follow Up Visit  Olivia Mosley [id number] 1956/04/26 58 y.o. 03/22/2014   Principle Diagnosis:   Iron deficiency anemia  Current Therapy:    IV iron as indicated-given February 2015     Interim History:  Olivia Mosley is back for followup. Other we first saw her back in February. At that point in time, her ferritin was 120. Her iron saturations 90%. She really was not that anemic but she had symptoms of iron deficiency. As such, will give her a dose of IV iron. She does feels a little better. She's had no problems with bleeding. She's had no weight loss weight gain. There's been no change in medications. She's had no bony issues. She does have chronic back discomfort.  She is a diabetic. She does have low thyroid.  Medications: Current outpatient prescriptions:ALPRAZolam (XANAX) 0.5 MG tablet, Take 1 tablet (0.5 mg total) by mouth 3 (three) times daily as needed for anxiety., Disp: 35 tablet, Rfl: 1;  atorvastatin (LIPITOR) 40 MG tablet, TAKE 1 TABLET BY MOUTH EVERY DAY (NEED TO SEE DR BEFORE MORE REFILLS), Disp: 90 tablet, Rfl: 3;  carisoprodol (SOMA) 350 MG tablet, Take 350 mg by mouth 3 (three) times daily. , Disp: , Rfl:  clobetasol cream (TEMOVATE) 0.05 %, APPLY TOPICALLY AS NEEDED (NEED TO SEE DR BEFORE MORE REFILLS), Disp: 60 g, Rfl: 0;  escitalopram (LEXAPRO) 10 MG tablet, TAKE 1 TABLET BY MOUTH DAILY (NEED TO SEE DR BEFORE MORE REFILLS), Disp: 90 tablet, Rfl: 1;  FINACEA 15 % cream, APPLY 1/2 GRAM TWICE DAILY, Disp: 50 g, Rfl: 0;  fluticasone (FLONASE) 50 MCG/ACT nasal spray, Place 1 spray into both nostrils daily., Disp: 16 g, Rfl: 6 levothyroxine (SYNTHROID, LEVOTHROID) 75 MCG tablet, TAKE 1 TABLET BY MOUTH EVERY DAY (NEED TO SEE DR BEFORE MORE REFILLS), Disp: 90 tablet, Rfl: 3;  meloxicam (MOBIC) 15 MG tablet, , Disp: , Rfl: ;  NEXIUM 40 MG capsule, TAKE 1 CAPSULE BY MOUTH DAILY (NEED TO SEE DR BEFORE MORE REFILLS), Disp: 90 capsule, Rfl: 3;  traZODone (DESYREL) 50  MG tablet, TAKE 2 TABLETS (100MG  TOTAL) BY MOUTH AT BEDTIME AS NEEDED FOR SLEEP, Disp: 180 tablet, Rfl: 3 amoxicillin (AMOXIL) 875 MG tablet, Take 1 tablet (875 mg total) by mouth 2 (two) times daily., Disp: 20 tablet, Rfl: 0;  fexofenadine (ALLEGRA) 180 MG tablet, TAKE 1 TABLET BY MOUTH DAILY (NEED TO SEE DR BEFORE MORE REFILLS), Disp: 90 tablet, Rfl: 3;  tretinoin (RETIN-A) 0.025 % cream, APPLY TOPICALLY AT BEDTIME (NEED TO SEE DR BEFORE MORE REFILLS), Disp: 45 g, Rfl: 0  Allergies:  Allergies  Allergen Reactions  . Flagyl [Metronidazole Hcl] Hives    Past Medical History, Surgical history, Social history, and Family History were reviewed and updated.  Review of Systems: As above  Physical Exam:  weight is 232 lb (105.235 kg). Her oral temperature is 98.6 F (37 C). Her blood pressure is 129/79 and her pulse is 74. Her respiration is 16.   Obese white female in no obvious distress. Head and neck exam shows no ocular or oral lesion. There are no palpable cervical or supraclavicular lymph nodes. Lungs are clear bilaterally. Cardiac exam regular in rhythm with no murmurs rubs or bruits. Abdomen is soft. She is obese. There is no fluid wave. No palpable liver or spleen tip. Back exam no tenderness over the spine. Extremities shows no clubbing cyanosis or edema. Skin exam no rashes.  Lab Results  Component Value Date  WBC 7.7 03/19/2014   HGB 13.7 03/19/2014   HCT 40.4 03/19/2014   MCV 91 03/19/2014   PLT 286 03/19/2014     Chemistry      Component Value Date/Time   NA 137 01/13/2014 1412   K 4.2 01/13/2014 1412   CL 101 01/13/2014 1412   CO2 28 01/13/2014 1412   BUN 17 01/13/2014 1412   CREATININE 0.6 01/13/2014 1412      Component Value Date/Time   CALCIUM 9.8 01/13/2014 1412   ALKPHOS 69 03/10/2013 1040   ALKPHOS 69 03/10/2013 1040   AST 19 03/10/2013 1040   AST 19 03/10/2013 1040   ALT 24 03/10/2013 1040   ALT 24 03/10/2013 1040   BILITOT 0.3 03/10/2013 1040   BILITOT 0.3 03/10/2013 1040          Impression and Plan: Olivia Mosley is 58 year old female with iron deficiency. She received IV iron about 5 months ago. She does show a low better. Her hemoglobin is better. Her MCV is also a little higher.  We will see with the iron studies show.  I just don't see a need for any additional studies on her.  We will determine when to get her back depending on her iron studies.   Volanda Napoleon, MD 6/29/20157:30 AM

## 2014-03-23 ENCOUNTER — Telehealth: Payer: Self-pay | Admitting: *Deleted

## 2014-03-23 NOTE — Telephone Encounter (Addendum)
Message copied by Orlando Penner on Tue Mar 23, 2014  2:58 PM ------      Message from: Burney Gauze R      Created: Mon Mar 22, 2014  5:47 PM       calll - normal iron!!  Laurey Arrow ------This message left on pt's home answering machine.

## 2014-04-26 LAB — HM MAMMOGRAPHY: HM Mammogram: NORMAL

## 2014-06-22 ENCOUNTER — Telehealth: Payer: Self-pay | Admitting: Internal Medicine

## 2014-06-22 NOTE — Telephone Encounter (Signed)
Sounds like a CPX is needed. Thanks

## 2014-06-22 NOTE — Telephone Encounter (Signed)
Patient is requesting appointment through mychart for the following: Urine Microalbumin Pap Smear Foot Exam Hemoglobin A1c Is this something Dr. Ronnald Ramp can address at one time?

## 2014-06-28 ENCOUNTER — Telehealth: Payer: Self-pay

## 2014-06-28 MED ORDER — ESCITALOPRAM OXALATE 10 MG PO TABS: ORAL_TABLET | ORAL | Status: DC

## 2014-06-28 NOTE — Telephone Encounter (Signed)
Refill request for lexapro 10 mg, 1 tab po qd, qty 90 w/ 1 rf, done

## 2014-07-07 LAB — HM DIABETES EYE EXAM

## 2014-07-09 ENCOUNTER — Ambulatory Visit (INDEPENDENT_AMBULATORY_CARE_PROVIDER_SITE_OTHER): Payer: Managed Care, Other (non HMO) | Admitting: Internal Medicine

## 2014-07-09 ENCOUNTER — Other Ambulatory Visit (INDEPENDENT_AMBULATORY_CARE_PROVIDER_SITE_OTHER): Payer: Managed Care, Other (non HMO)

## 2014-07-09 ENCOUNTER — Encounter: Payer: Self-pay | Admitting: Internal Medicine

## 2014-07-09 ENCOUNTER — Other Ambulatory Visit (HOSPITAL_COMMUNITY)
Admission: RE | Admit: 2014-07-09 | Discharge: 2014-07-09 | Disposition: A | Discharge status: Home / Self Care | Payer: Managed Care, Other (non HMO) | Source: Ambulatory Visit | Attending: Internal Medicine | Admitting: Internal Medicine

## 2014-07-09 VITALS — BP 116/68 | HR 101 | Temp 98.3°F | Resp 16 | Ht 64.0 in | Wt 242.0 lb

## 2014-07-09 DIAGNOSIS — K219 Gastro-esophageal reflux disease without esophagitis: Secondary | ICD-10-CM

## 2014-07-09 DIAGNOSIS — E785 Hyperlipidemia, unspecified: Secondary | ICD-10-CM

## 2014-07-09 DIAGNOSIS — D509 Iron deficiency anemia, unspecified: Secondary | ICD-10-CM

## 2014-07-09 DIAGNOSIS — Z01419 Encounter for gynecological examination (general) (routine) without abnormal findings: Secondary | ICD-10-CM | POA: Insufficient documentation

## 2014-07-09 DIAGNOSIS — F419 Anxiety disorder, unspecified: Secondary | ICD-10-CM

## 2014-07-09 DIAGNOSIS — F418 Other specified anxiety disorders: Secondary | ICD-10-CM

## 2014-07-09 DIAGNOSIS — Z124 Encounter for screening for malignant neoplasm of cervix: Secondary | ICD-10-CM

## 2014-07-09 DIAGNOSIS — R739 Hyperglycemia, unspecified: Secondary | ICD-10-CM

## 2014-07-09 DIAGNOSIS — E038 Other specified hypothyroidism: Secondary | ICD-10-CM

## 2014-07-09 DIAGNOSIS — E559 Vitamin D deficiency, unspecified: Secondary | ICD-10-CM

## 2014-07-09 DIAGNOSIS — G4700 Insomnia, unspecified: Secondary | ICD-10-CM

## 2014-07-09 DIAGNOSIS — L309 Dermatitis, unspecified: Secondary | ICD-10-CM

## 2014-07-09 DIAGNOSIS — Z0000 Encounter for general adult medical examination without abnormal findings: Secondary | ICD-10-CM

## 2014-07-09 DIAGNOSIS — J0100 Acute maxillary sinusitis, unspecified: Secondary | ICD-10-CM

## 2014-07-09 LAB — COMPREHENSIVE METABOLIC PANEL
ALT: 25 U/L (ref 0–35)
AST: 21 U/L (ref 0–37)
Albumin: 3.5 g/dL (ref 3.5–5.2)
Alkaline Phosphatase: 77 U/L (ref 39–117)
BUN: 17 mg/dL (ref 6–23)
CO2: 28 mEq/L (ref 19–32)
Calcium: 9.7 mg/dL (ref 8.4–10.5)
Chloride: 104 mEq/L (ref 96–112)
Creatinine, Ser: 0.6 mg/dL (ref 0.4–1.2)
GFR: 105 mL/min (ref >60.00)
Glucose, Bld: 106 mg/dL — ABNORMAL HIGH (ref 70–99)
Potassium: 4 mEq/L (ref 3.5–5.1)
Sodium: 137 mEq/L (ref 135–145)
Total Bilirubin: 0.4 mg/dL (ref 0.2–1.2)
Total Protein: 7.3 g/dL (ref 6.0–8.3)

## 2014-07-09 LAB — CBC WITH DIFFERENTIAL/PLATELET
Basophils Absolute: 0 10*3/uL (ref 0.0–0.1)
Basophils Relative: 0.2 % (ref 0.0–3.0)
Eosinophils Absolute: 0.2 10*3/uL (ref 0.0–0.7)
Eosinophils Relative: 2.2 % (ref 0.0–5.0)
HCT: 40.8 % (ref 36.0–46.0)
Hemoglobin: 13.3 g/dL (ref 12.0–15.0)
Lymphocytes Relative: 19 % (ref 12.0–46.0)
Lymphs Abs: 1.5 10*3/uL (ref 0.7–4.0)
MCHC: 32.7 g/dL (ref 30.0–36.0)
MCV: 88.7 fl (ref 78.0–100.0)
Monocytes Absolute: 0.6 10*3/uL (ref 0.1–1.0)
Monocytes Relative: 7.9 % (ref 3.0–12.0)
Neutro Abs: 5.7 10*3/uL (ref 1.4–7.7)
Neutrophils Relative %: 70.7 % (ref 43.0–77.0)
Platelets: 285 10*3/uL (ref 150.0–400.0)
RBC: 4.6 Mil/uL (ref 3.87–5.11)
RDW: 13.9 % (ref 11.5–15.5)
WBC: 8.1 10*3/uL (ref 4.0–10.5)

## 2014-07-09 LAB — LIPID PANEL
Cholesterol: 182 mg/dL (ref 0–200)
HDL: 42.7 mg/dL (ref >39.00)
LDL Cholesterol: 105 mg/dL — ABNORMAL HIGH (ref 0–99)
NonHDL: 139.3
Total CHOL/HDL Ratio: 4
Triglycerides: 172 mg/dL — ABNORMAL HIGH (ref 0.0–149.0)
VLDL: 34.4 mg/dL (ref 0.0–40.0)

## 2014-07-09 LAB — TSH: TSH: 2.47 u[IU]/mL (ref 0.35–4.50)

## 2014-07-09 LAB — VITAMIN D 25 HYDROXY (VIT D DEFICIENCY, FRACTURES): VITD: 24.4 ng/mL — ABNORMAL LOW (ref 30.00–100.00)

## 2014-07-09 LAB — VITAMIN B12: Vitamin B-12: 544 pg/mL (ref 211–911)

## 2014-07-09 LAB — FOLATE: Folate: 16.9 ng/mL (ref >5.9)

## 2014-07-09 LAB — HM PAP SMEAR: HM Pap smear: NORMAL

## 2014-07-09 LAB — HEMOGLOBIN A1C: Hgb A1c MFr Bld: 6 % (ref 4.6–6.5)

## 2014-07-09 LAB — FECAL OCCULT BLOOD, GUAIAC: Fecal Occult Blood: NEGATIVE

## 2014-07-09 MED ORDER — ESCITALOPRAM OXALATE 10 MG PO TABS: ORAL_TABLET | ORAL | Status: DC

## 2014-07-09 MED ORDER — ATORVASTATIN CALCIUM 40 MG PO TABS: ORAL_TABLET | ORAL | Status: DC

## 2014-07-09 MED ORDER — ALPRAZOLAM 0.5 MG PO TABS: 0.5000 mg | ORAL_TABLET | Freq: Two times a day (BID) | ORAL | Status: DC | PRN

## 2014-07-09 MED ORDER — CLOBETASOL PROPIONATE 0.05 % EX CREA: TOPICAL_CREAM | Freq: Two times a day (BID) | CUTANEOUS | Status: DC

## 2014-07-09 MED ORDER — ESOMEPRAZOLE MAGNESIUM 40 MG PO CPDR: DELAYED_RELEASE_CAPSULE | ORAL | Status: DC

## 2014-07-09 MED ORDER — CHOLECALCIFEROL 50 MCG (2000 UT) PO TABS: 1.0000 | ORAL_TABLET | Freq: Every day | ORAL | Status: DC

## 2014-07-09 MED ORDER — TRAZODONE HCL 50 MG PO TABS: ORAL_TABLET | ORAL | Status: DC

## 2014-07-09 MED ORDER — AMOXICILLIN 875 MG PO TABS: 875.0000 mg | ORAL_TABLET | Freq: Two times a day (BID) | ORAL | Status: DC

## 2014-07-09 NOTE — Progress Notes (Signed)
Pre visit review using our clinic review tool, if applicable. No additional management support is needed unless otherwise documented below in the visit note. 

## 2014-07-09 NOTE — Patient Instructions (Signed)
Rash A rash is a change in the color or texture of your skin. There are many different types of rashes. You may have other problems that accompany your rash. CAUSES   Infections.  Allergic reactions. This can include allergies to pets or foods.  Certain medicines.  Exposure to certain chemicals, soaps, or cosmetics.  Heat.  Exposure to poisonous plants.  Tumors, both cancerous and noncancerous. SYMPTOMS   Redness.  Scaly skin.  Itchy skin.  Dry or cracked skin.  Bumps.  Blisters.  Pain. DIAGNOSIS  Your caregiver may do a physical exam to determine what type of rash you have. A skin sample (biopsy) may be taken and examined under a microscope. TREATMENT  Treatment depends on the type of rash you have. Your caregiver may prescribe certain medicines. For serious conditions, you may need to see a skin doctor (dermatologist). HOME CARE INSTRUCTIONS   Avoid the substance that caused your rash.  Do not scratch your rash. This can cause infection.  You may take cool baths to help stop itching.  Only take over-the-counter or prescription medicines as directed by your caregiver.  Keep all follow-up appointments as directed by your caregiver. SEEK IMMEDIATE MEDICAL CARE IF:  You have increasing pain, swelling, or redness.  You have a fever.  You have new or severe symptoms.  You have body aches, diarrhea, or vomiting.  Your rash is not better after 3 days. MAKE SURE YOU:  Understand these instructions.  Will watch your condition.  Will get help right away if you are not doing well or get worse. Document Released: 08/31/2002 Document Revised: 12/03/2011 Document Reviewed: 06/25/2011 [facility] Patient Information 2015 [place], [place]. This information is not intended to replace advice given to you by your health care provider. Make sure you discuss any questions you have with your health care provider. Abdominal Pain Many things can cause abdominal pain. Usually,  abdominal pain is not caused by a disease and will improve without treatment. It can often be observed and treated at home. Your health care provider will do a physical exam and possibly order blood tests and X-rays to help determine the seriousness of your pain. However, in many cases, more time must pass before a clear cause of the pain can be found. Before that point, your health care provider may not know if you need more testing or further treatment. HOME CARE INSTRUCTIONS  Monitor your abdominal pain for any changes. The following actions may help to alleviate any discomfort you are experiencing:  Only take over-the-counter or prescription medicines as directed by your health care provider.  Do not take laxatives unless directed to do so by your health care provider.  Try a clear liquid diet (broth, tea, or water) as directed by your health care provider. Slowly move to a bland diet as tolerated. SEEK MEDICAL CARE IF:  You have unexplained abdominal pain.  You have abdominal pain associated with nausea or diarrhea.  You have pain when you urinate or have a bowel movement.  You experience abdominal pain that wakes you in the night.  You have abdominal pain that is worsened or improved by eating food.  You have abdominal pain that is worsened with eating fatty foods.  You have a fever. SEEK IMMEDIATE MEDICAL CARE IF:   Your pain does not go away within 2 hours.  You keep throwing up (vomiting).  Your pain is felt only in portions of the abdomen, such as the right side or the left lower portion  of the abdomen.  You pass bloody or black tarry stools. MAKE SURE YOU:  Understand these instructions.   Will watch your condition.   Will get help right away if you are not doing well or get worse.  Document Released: 06/20/2005 Document Revised: 09/15/2013 Document Reviewed: 05/20/2013 [facility] Patient Information 2015 [place], [place]. This information is not intended to  replace advice given to you by your health care provider. Make sure you discuss any questions you have with your health care provider.

## 2014-07-09 NOTE — Progress Notes (Signed)
Subjective:    Patient ID: Olivia Mosley, female    DOB: [birth year], 58 y.o.   MRN: [id number]  Sinusitis This is a recurrent problem. The current episode started 1 to 4 weeks ago. The problem has been gradually worsening since onset. There has been no fever. The fever has been present for less than 1 day. Her pain is at a severity of 0/10. She is experiencing no pain. Associated symptoms include sinus pressure. Pertinent negatives include no chills, congestion, coughing, diaphoresis, ear pain, headaches, hoarse voice, neck pain, shortness of breath, sneezing, sore throat or swollen glands. Past treatments include spray decongestants. The treatment provided mild relief.      Review of Systems  Constitutional: Negative.  Negative for fever, chills, diaphoresis, appetite change and fatigue.  HENT: Positive for postnasal drip, rhinorrhea and sinus pressure. Negative for congestion, ear pain, hoarse voice, nosebleeds, sneezing, sore throat and trouble swallowing.   Eyes: Negative.   Respiratory: Negative.  Negative for cough, choking, chest tightness, shortness of breath, wheezing and stridor.   Cardiovascular: Negative.  Negative for chest pain, palpitations and leg swelling.  Gastrointestinal: Negative.  Negative for nausea, abdominal pain, diarrhea, constipation and blood in stool.  Endocrine: Negative.   Genitourinary: Negative.   Musculoskeletal: Negative.  Negative for arthralgias, back pain, myalgias and neck pain.  Skin: Negative.  Negative for rash.  Allergic/Immunologic: Negative.   Neurological: Negative.  Negative for headaches.  Hematological: Negative.  Negative for adenopathy. Does not bruise/bleed easily.  Psychiatric/Behavioral: Positive for sleep disturbance and dysphoric mood. Negative for suicidal ideas, hallucinations, behavioral problems, confusion, self-injury, decreased concentration and agitation. The patient is nervous/anxious. The patient is not hyperactive.       Objective:   Physical Exam  Vitals reviewed. Constitutional: She is oriented to person, place, and time. She appears well-developed and well-nourished. No distress.  HENT:  Head: Normocephalic and atraumatic.  Right Ear: Hearing, tympanic membrane, external ear and ear canal normal.  Left Ear: Hearing, tympanic membrane, external ear and ear canal normal.  Nose: Rhinorrhea present. No sinus tenderness. No epistaxis. Right sinus exhibits maxillary sinus tenderness. Right sinus exhibits no frontal sinus tenderness. Left sinus exhibits no maxillary sinus tenderness and no frontal sinus tenderness.  Mouth/Throat: Oropharynx is clear and moist and mucous membranes are normal. Mucous membranes are not pale, not dry and not cyanotic. No oral lesions. No trismus in the jaw. No uvula swelling. No oropharyngeal exudate, posterior oropharyngeal edema, posterior oropharyngeal erythema or tonsillar abscesses.  Eyes: Conjunctivae are normal. Right eye exhibits no discharge. Left eye exhibits no discharge. No scleral icterus.  Neck: Normal range of motion. Neck supple. No JVD present. No tracheal deviation present. No thyromegaly present.  Cardiovascular: Normal rate, regular rhythm, normal heart sounds and intact distal pulses.  Exam reveals no gallop and no friction rub.   No murmur heard. Pulmonary/Chest: Effort normal and breath sounds normal. No stridor. No respiratory distress. She has no wheezes. She has no rales. She exhibits no tenderness.  Abdominal: Soft. Bowel sounds are normal. She exhibits no distension and no mass. There is no tenderness. There is no rebound and no guarding. Hernia confirmed negative in the right inguinal area and confirmed negative in the left inguinal area.  Genitourinary: Rectum normal, vagina normal and uterus normal. Rectal exam shows no external hemorrhoid, no internal hemorrhoid, no fissure, no mass, no tenderness and anal tone normal. Guaiac negative stool. No breast  swelling, tenderness, discharge or bleeding. Pelvic exam was performed with  patient supine. No labial fusion. There is no rash, tenderness, lesion or injury on the right labia. There is no rash, tenderness, lesion or injury on the left labia. Uterus is not deviated, not enlarged, not fixed and not tender. Cervix exhibits no motion tenderness, no discharge and no friability. Right adnexum displays no mass, no tenderness and no fullness. Left adnexum displays no mass, no tenderness and no fullness. No erythema, tenderness or bleeding around the vagina. No foreign body around the vagina. No signs of injury around the vagina. No vaginal discharge found.  Musculoskeletal: Normal range of motion. She exhibits no edema and no tenderness.  Lymphadenopathy:    She has no cervical adenopathy.       Right: No inguinal adenopathy present.       Left: No inguinal adenopathy present.  Neurological: She is oriented to person, place, and time.  Skin: Skin is warm and dry. No rash noted. She is not diaphoretic. No erythema. No pallor.  Psychiatric: Her speech is normal and behavior is normal. Judgment and thought content normal. Her mood appears anxious. Her affect is not angry, not blunt, not labile and not inappropriate. Cognition and memory are normal. She does not exhibit a depressed mood. She expresses no homicidal and no suicidal ideation. She expresses no suicidal plans.     Lab Results  Component Value Date   WBC 7.7 03/19/2014   HGB 13.7 03/19/2014   HCT 40.4 03/19/2014   PLT 286 03/19/2014   GLUCOSE 97 01/13/2014   CHOL 159 03/10/2013   TRIG 140.0 03/10/2013   HDL 53.30 03/10/2013   LDLCALC 78 03/10/2013   ALT 24 03/10/2013   ALT 24 03/10/2013   AST 19 03/10/2013   AST 19 03/10/2013   NA 137 01/13/2014   K 4.2 01/13/2014   CL 101 01/13/2014   CREATININE 0.6 01/13/2014   BUN 17 01/13/2014   CO2 28 01/13/2014   TSH 1.969 03/19/2014   HGBA1C 5.6 01/13/2014       Assessment & Plan:

## 2014-07-11 NOTE — Assessment & Plan Note (Signed)
Her TSH is on the normal range She will stay on the current dose of T

## 2014-07-11 NOTE — Assessment & Plan Note (Signed)
Will treat the infection with amoxil 

## 2014-07-11 NOTE — Assessment & Plan Note (Signed)
She has pre-diabetes 

## 2014-07-11 NOTE — Assessment & Plan Note (Signed)
I have asked her to start a qd Vit D supplement

## 2014-07-11 NOTE — Assessment & Plan Note (Signed)
Cont lexapro and trazodone She will add xanax for the occasional panic

## 2014-07-11 NOTE — Assessment & Plan Note (Signed)
Exam done PAP collected and sent to pathology Vaccines were reviewed and updated Labs ordered Pt ed material was given

## 2014-07-13 ENCOUNTER — Encounter: Payer: Self-pay | Admitting: Internal Medicine

## 2014-07-13 LAB — CYTOLOGY - PAP

## 2014-07-19 ENCOUNTER — Encounter: Payer: Self-pay | Admitting: Internal Medicine

## 2014-07-23 ENCOUNTER — Other Ambulatory Visit (HOSPITAL_BASED_OUTPATIENT_CLINIC_OR_DEPARTMENT_OTHER): Payer: Managed Care, Other (non HMO) | Admitting: Lab

## 2014-07-23 ENCOUNTER — Encounter: Payer: Self-pay | Admitting: Family

## 2014-07-23 ENCOUNTER — Other Ambulatory Visit: Payer: Self-pay | Admitting: Family

## 2014-07-23 ENCOUNTER — Ambulatory Visit (HOSPITAL_BASED_OUTPATIENT_CLINIC_OR_DEPARTMENT_OTHER): Payer: Managed Care, Other (non HMO) | Admitting: Family

## 2014-07-23 VITALS — BP 121/61 | HR 68 | Temp 97.9°F | Resp 18 | Wt 245.0 lb

## 2014-07-23 DIAGNOSIS — D509 Iron deficiency anemia, unspecified: Secondary | ICD-10-CM

## 2014-07-23 LAB — CBC WITH DIFFERENTIAL (CANCER CENTER ONLY)
BASO#: 0 10*3/uL (ref 0.0–0.2)
BASO%: 0.3 % (ref 0.0–2.0)
EOS%: 2.7 % (ref 0.0–7.0)
Eosinophils Absolute: 0.2 10*3/uL (ref 0.0–0.5)
HCT: 37 % (ref 34.8–46.6)
HGB: 12.4 g/dL (ref 11.6–15.9)
LYMPH#: 1.7 10*3/uL (ref 0.9–3.3)
LYMPH%: 21.2 % (ref 14.0–48.0)
MCH: 30 pg (ref 26.0–34.0)
MCHC: 33.5 g/dL (ref 32.0–36.0)
MCV: 90 fL (ref 81–101)
MONO#: 0.6 10*3/uL (ref 0.1–0.9)
MONO%: 7.2 % (ref 0.0–13.0)
NEUT#: 5.4 10*3/uL (ref 1.5–6.5)
NEUT%: 68.6 % (ref 39.6–80.0)
Platelets: 284 10*3/uL (ref 145–400)
RBC: 4.13 10*6/uL (ref 3.70–5.32)
RDW: 13.8 % (ref 11.1–15.7)
WBC: 7.9 10*3/uL (ref 3.9–10.0)
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
## 2014-07-26 LAB — IRON AND TIBC CHCC
%SAT: 21 % (ref 21–57)
Iron: 57 ug/dL (ref 41–142)
TIBC: 279 ug/dL (ref 236–444)
UIBC: 222 ug/dL (ref 120–384)

## 2014-07-26 LAB — FERRITIN CHCC: Ferritin: 107 ng/ml (ref 9–269)

## 2014-07-27 LAB — HEMOGLOBINOPATHY EVALUATION
Hemoglobin Other: 0 %
Hgb A2 Quant: 2.4 % (ref 2.2–3.2)
Hgb A: 97.6 % (ref 96.8–97.8)
Hgb F Quant: 0 % (ref 0.0–2.0)
Hgb S Quant: 0 %

## 2014-07-27 LAB — ERYTHROPOIETIN: Erythropoietin: 17.9 m[IU]/mL (ref 2.6–18.5)

## 2014-07-27 LAB — RETICULOCYTES (CHCC)
ABS Retic: 68.3 10*3/uL (ref 19.0–186.0)
RBC.: 4.27 MIL/uL (ref 3.87–5.11)
Retic Ct Pct: 1.6 % (ref 0.4–2.3)

## 2014-08-27 ENCOUNTER — Telehealth: Payer: Self-pay | Admitting: Internal Medicine

## 2014-08-27 MED ORDER — BENZOCAINE-RESORCINOL 5-2 % VA CREA: TOPICAL_CREAM | Freq: Every day | VAGINAL | Status: DC

## 2014-08-27 NOTE — Telephone Encounter (Signed)
What am I supposed to be treating here?

## 2014-08-27 NOTE — Telephone Encounter (Signed)
Pt is requesting a vaginal itch cream, can something be called in for her? She states it is urgent.  [facility].

## 2014-08-27 NOTE — Telephone Encounter (Signed)
Try vagisil There is no record of her telling me about vaginal itching by the way She needs to f/up soon

## 2014-08-27 NOTE — Telephone Encounter (Signed)
Pt states she discussed at last OV and something was to be called in but never was. Pt states she is very itchy and just needs a cream to help with the itch.

## 2014-09-03 ENCOUNTER — Telehealth: Payer: Self-pay | Admitting: Internal Medicine

## 2014-09-03 NOTE — Telephone Encounter (Signed)
Pt's PCP is Dr Ronnald Ramp who is not the office today. Pt has chronic vaginal itch, Vagasil is not strong enough. Pt wants to know if there is anything else she can use OTC? Or possibly something else?

## 2014-09-03 NOTE — Telephone Encounter (Signed)
Unfortunately she needs to be evaluated. Per last phone note Dr. Ronnald Ramp doesn't know anything about this.

## 2014-09-13 ENCOUNTER — Ambulatory Visit: Payer: Managed Care, Other (non HMO) | Admitting: Internal Medicine

## 2014-09-14 NOTE — Telephone Encounter (Signed)
See other note

## 2014-11-10 ENCOUNTER — Other Ambulatory Visit (INDEPENDENT_AMBULATORY_CARE_PROVIDER_SITE_OTHER): Payer: Managed Care, Other (non HMO)

## 2014-11-10 ENCOUNTER — Ambulatory Visit (INDEPENDENT_AMBULATORY_CARE_PROVIDER_SITE_OTHER): Payer: Managed Care, Other (non HMO) | Admitting: Internal Medicine

## 2014-11-10 ENCOUNTER — Encounter: Payer: Self-pay | Admitting: Internal Medicine

## 2014-11-10 VITALS — BP 142/76 | HR 80 | Temp 98.2°F | Resp 16 | Wt 240.0 lb

## 2014-11-10 DIAGNOSIS — G5600 Carpal tunnel syndrome, unspecified upper limb: Secondary | ICD-10-CM | POA: Insufficient documentation

## 2014-11-10 DIAGNOSIS — N898 Other specified noninflammatory disorders of vagina: Secondary | ICD-10-CM

## 2014-11-10 DIAGNOSIS — E039 Hypothyroidism, unspecified: Secondary | ICD-10-CM

## 2014-11-10 DIAGNOSIS — R209 Unspecified disturbances of skin sensation: Secondary | ICD-10-CM

## 2014-11-10 DIAGNOSIS — D509 Iron deficiency anemia, unspecified: Secondary | ICD-10-CM

## 2014-11-10 DIAGNOSIS — IMO0001 Reserved for inherently not codable concepts without codable children: Secondary | ICD-10-CM

## 2014-11-10 DIAGNOSIS — L309 Dermatitis, unspecified: Secondary | ICD-10-CM

## 2014-11-10 LAB — CBC WITH DIFFERENTIAL/PLATELET
Basophils Absolute: 0 10*3/uL (ref 0.0–0.1)
Basophils Relative: 0.3 % (ref 0.0–3.0)
Eosinophils Absolute: 0.3 10*3/uL (ref 0.0–0.7)
Eosinophils Relative: 3.1 % (ref 0.0–5.0)
HCT: 41.5 % (ref 36.0–46.0)
Hemoglobin: 13.9 g/dL (ref 12.0–15.0)
Lymphocytes Relative: 18.6 % (ref 12.0–46.0)
Lymphs Abs: 1.6 10*3/uL (ref 0.7–4.0)
MCHC: 33.5 g/dL (ref 30.0–36.0)
MCV: 87.1 fl (ref 78.0–100.0)
Monocytes Absolute: 0.7 10*3/uL (ref 0.1–1.0)
Monocytes Relative: 8.1 % (ref 3.0–12.0)
Neutro Abs: 6 10*3/uL (ref 1.4–7.7)
Neutrophils Relative %: 69.9 % (ref 43.0–77.0)
Platelets: 299 10*3/uL (ref 150.0–400.0)
RBC: 4.76 Mil/uL (ref 3.87–5.11)
RDW: 13.5 % (ref 11.5–15.5)
WBC: 8.6 10*3/uL (ref 4.0–10.5)

## 2014-11-10 LAB — BASIC METABOLIC PANEL
BUN: 30 mg/dL — ABNORMAL HIGH (ref 6–23)
CO2: 25 mEq/L (ref 19–32)
Calcium: 9.9 mg/dL (ref 8.4–10.5)
Chloride: 107 mEq/L (ref 96–112)
Creatinine, Ser: 0.71 mg/dL (ref 0.40–1.20)
GFR: 89.69 mL/min (ref >60.00)
Glucose, Bld: 95 mg/dL (ref 70–99)
Potassium: 4.3 mEq/L (ref 3.5–5.1)
Sodium: 141 mEq/L (ref 135–145)

## 2014-11-10 LAB — TSH: TSH: 2.72 u[IU]/mL (ref 0.35–4.50)

## 2014-11-10 MED ORDER — TRIAMCINOLONE ACETONIDE 0.5 % EX CREA: 1.0000 "application " | TOPICAL_CREAM | Freq: Three times a day (TID) | CUTANEOUS | Status: DC

## 2014-11-10 NOTE — Addendum Note (Signed)
Addended by: Estell Harpin T on: 11/10/2014 04:28 PM   Modules accepted: Orders

## 2014-11-10 NOTE — Patient Instructions (Signed)
Eczema Eczema, also called atopic dermatitis, is a skin disorder that causes inflammation of the skin. It causes a red rash and dry, scaly skin. The skin becomes very itchy. Eczema is generally worse during the cooler winter months and often improves with the warmth of summer. Eczema usually starts showing signs in infancy. Some children outgrow eczema, but it may last through adulthood.  CAUSES  The exact cause of eczema is not known, but it appears to run in families. People with eczema often have a family history of eczema, allergies, asthma, or hay fever. Eczema is not contagious. Flare-ups of the condition may be caused by:   Contact with something you are sensitive or allergic to.   Stress. SIGNS AND SYMPTOMS  Dry, scaly skin.   Red, itchy rash.   Itchiness. This may occur before the skin rash and may be very intense.  DIAGNOSIS  The diagnosis of eczema is usually made based on symptoms and medical history. TREATMENT  Eczema cannot be cured, but symptoms usually can be controlled with treatment and other strategies. A treatment plan might include:  Controlling the itching and scratching.   Use over-the-counter antihistamines as directed for itching. This is especially useful at night when the itching tends to be worse.   Use over-the-counter steroid creams as directed for itching.   Avoid scratching. Scratching makes the rash and itching worse. It may also result in a skin infection (impetigo) due to a break in the skin caused by scratching.   Keeping the skin well moisturized with creams every day. This will seal in moisture and help prevent dryness. Lotions that contain alcohol and water should be avoided because they can dry the skin.   Limiting exposure to things that you are sensitive or allergic to (allergens).   Recognizing situations that cause stress.   Developing a plan to manage stress.  HOME CARE INSTRUCTIONS   Only take over-the-counter or  prescription medicines as directed by your health care provider.   Do not use anything on the skin without checking with your health care provider.   Keep baths or showers short (5 minutes) in warm (not hot) water. Use mild cleansers for bathing. These should be unscented. You may add nonperfumed bath oil to the bath water. It is best to avoid soap and bubble bath.   Immediately after a bath or shower, when the skin is still damp, apply a moisturizing ointment to the entire body. This ointment should be a petroleum ointment. This will seal in moisture and help prevent dryness. The thicker the ointment, the better. These should be unscented.   Keep fingernails cut short. Children with eczema may need to wear soft gloves or mittens at night after applying an ointment.   Dress in clothes made of cotton or cotton blends. Dress lightly, because heat increases itching.   A child with eczema should stay away from anyone with fever blisters or cold sores. The virus that causes fever blisters (herpes simplex) can cause a serious skin infection in children with eczema. SEEK MEDICAL CARE IF:   Your itching interferes with sleep.   Your rash gets worse or is not better within 1 week after starting treatment.   You see pus or soft yellow scabs in the rash area.   You have a fever.   You have a rash flare-up after contact with someone who has fever blisters.  Document Released: 09/07/2000 Document Revised: 07/01/2013 Document Reviewed: 04/13/2013 ExitCare Patient Information 2015 [employer]. This information   is not intended to replace advice given to you by your health care provider. Make sure you discuss any questions you have with your health care provider.  

## 2014-11-10 NOTE — Progress Notes (Signed)
Pre visit review using our clinic review tool, if applicable. No additional management support is needed unless otherwise documented below in the visit note. 

## 2014-11-10 NOTE — Progress Notes (Signed)
Subjective:    Patient ID: Olivia Mosley, female    DOB: May 27, 1956, 59 y.o.   MRN: [id number]  Vaginal Discharge The patient's primary symptoms include genital itching, a genital rash and vaginal discharge (scant, wite). The patient's pertinent negatives include no genital lesions, genital odor, missed menses, pelvic pain or vaginal bleeding. This is a recurrent problem. The current episode started more than 1 month ago. The problem occurs intermittently. The problem has been unchanged. The patient is experiencing no pain. The problem affects both sides. Associated symptoms include back pain and rash. Pertinent negatives include no abdominal pain, anorexia, chills, constipation, diarrhea, discolored urine, dysuria, fever, flank pain, frequency, headaches, hematuria, joint pain, joint swelling, nausea, painful intercourse, sore throat, urgency or vomiting. The vaginal discharge was white. There has been no bleeding. She has not been passing clots. She has not been passing tissue. Nothing aggravates the symptoms. Treatments tried: OTC vagifem , etc. She is not sexually active.      Review of Systems  Constitutional: Negative.  Negative for fever, chills, diaphoresis, appetite change and fatigue.  HENT: Negative.  Negative for sore throat.   Eyes: Negative.   Respiratory: Negative.  Negative for cough, choking, chest tightness and stridor.   Cardiovascular: Negative.  Negative for chest pain, palpitations and leg swelling.  Gastrointestinal: Negative.  Negative for nausea, vomiting, abdominal pain, diarrhea, constipation and anorexia.  Endocrine: Negative.   Genitourinary: Positive for vaginal discharge (scant, wite). Negative for dysuria, urgency, frequency, hematuria, flank pain, decreased urine volume, vaginal bleeding, menstrual problem, pelvic pain and missed menses.  Musculoskeletal: Positive for back pain. Negative for myalgias, joint pain, joint swelling, arthralgias, gait problem, neck  pain and neck stiffness.  Skin: Positive for rash. Negative for color change, pallor and wound.  Allergic/Immunologic: Negative.   Neurological: Positive for numbness. Negative for dizziness, tremors, seizures, syncope, speech difficulty, weakness, light-headedness and headaches.       For one year she has had numbness and tingling in both arms and in her LLE  Hematological: Negative.  Negative for adenopathy. Does not bruise/bleed easily.  Psychiatric/Behavioral: Negative.        Objective:   Physical Exam  Constitutional: She appears well-developed and well-nourished. No distress.  HENT:  Head: Normocephalic and atraumatic.  Mouth/Throat: Oropharynx is clear and moist. No oropharyngeal exudate.  Eyes: Conjunctivae are normal. Right eye exhibits no discharge. Left eye exhibits no discharge. No scleral icterus.  Neck: Normal range of motion. Neck supple. No JVD present. No tracheal deviation present. No thyromegaly present.  Cardiovascular: Normal rate, regular rhythm, normal heart sounds and intact distal pulses.  Exam reveals no gallop and no friction rub.   No murmur heard. Pulmonary/Chest: Effort normal and breath sounds normal. No stridor. No respiratory distress. She has no wheezes. She has no rales. She exhibits no tenderness.  Abdominal: Soft. Bowel sounds are normal. She exhibits no distension and no mass. There is no tenderness. There is no rebound and no guarding. Hernia confirmed negative in the right inguinal area and confirmed negative in the left inguinal area.  Genitourinary: Vagina normal.    No breast swelling, tenderness, discharge or bleeding. Pelvic exam was performed with patient supine. No labial fusion. There is rash on the right labia. There is no tenderness, lesion or injury on the right labia. There is rash on the left labia. There is no tenderness, lesion or injury on the left labia. No erythema, tenderness or bleeding in the vagina. No foreign body around  the  vagina. No signs of injury around the vagina. No vaginal discharge found.  Musculoskeletal: Normal range of motion. She exhibits no edema or tenderness.  Lymphadenopathy:    She has no cervical adenopathy.       Right: No inguinal adenopathy present.       Left: No inguinal adenopathy present.  Neurological: She is alert. She has normal strength. She displays no atrophy, no tremor and normal reflexes. No cranial nerve deficit or sensory deficit. She exhibits normal muscle tone. She displays a negative Romberg sign. She displays no seizure activity. Coordination and gait normal.  Reflex Scores:      Tricep reflexes are 1+ on the right side and 1+ on the left side.      Bicep reflexes are 1+ on the right side and 1+ on the left side.      Brachioradialis reflexes are 1+ on the right side and 1+ on the left side.      Patellar reflexes are 2+ on the right side and 2+ on the left side.      Achilles reflexes are 1+ on the right side and 1+ on the left side. Neg SLR in BLE  Skin: She is not diaphoretic.  Vitals reviewed.    Lab Results  Component Value Date   WBC 7.9 07/23/2014   HGB 12.4 07/23/2014   HCT 37.0 07/23/2014   PLT 284 07/23/2014   GLUCOSE 106* 07/09/2014   CHOL 182 07/09/2014   TRIG 172.0* 07/09/2014   HDL 42.70 07/09/2014   LDLCALC 105* 07/09/2014   ALT 25 07/09/2014   AST 21 07/09/2014   NA 137 07/09/2014   K 4.0 07/09/2014   CL 104 07/09/2014   CREATININE 0.6 07/09/2014   BUN 17 07/09/2014   CO2 28 07/09/2014   TSH 2.47 07/09/2014   HGBA1C 6.0 07/09/2014       Assessment & Plan:

## 2014-11-10 NOTE — Assessment & Plan Note (Signed)
I will recheck her TSH and will adjust her dose if needed 

## 2014-11-10 NOTE — Assessment & Plan Note (Signed)
Her exam is WNL, she does a lot of repetitive activity at work and has a complicated low back history, I have asked her to have a NCS/EMG done to help find the cause for her symptoms

## 2014-11-10 NOTE — Assessment & Plan Note (Signed)
She appears to have eczema around the vagina and perineum Will check a wet prep for yeast She has topical steroids at home but she has not used them, instead she has been using an OTC product that may be making this worse and causing a contact dermatitis Will restart topical steroids

## 2014-11-11 ENCOUNTER — Encounter: Payer: Self-pay | Admitting: Internal Medicine

## 2014-11-11 ENCOUNTER — Other Ambulatory Visit: Payer: Managed Care, Other (non HMO)

## 2014-11-11 DIAGNOSIS — N898 Other specified noninflammatory disorders of vagina: Secondary | ICD-10-CM

## 2014-11-11 LAB — WET PREP, GENITAL
Bacteria: NONE SEEN — AB
Clue Cells Wet Prep HPF POC: NONE SEEN — AB
Trich, Wet Prep: NONE SEEN — AB
Yeast Wet Prep HPF POC: NONE SEEN — AB

## 2014-12-03 ENCOUNTER — Telehealth: Payer: Self-pay

## 2014-12-03 DIAGNOSIS — G4700 Insomnia, unspecified: Secondary | ICD-10-CM

## 2014-12-03 DIAGNOSIS — F418 Other specified anxiety disorders: Secondary | ICD-10-CM

## 2014-12-03 MED ORDER — ALPRAZOLAM 0.5 MG PO TABS: 0.5000 mg | ORAL_TABLET | Freq: Two times a day (BID) | ORAL | Status: DC | PRN

## 2014-12-03 NOTE — Telephone Encounter (Signed)
done

## 2014-12-03 NOTE — Telephone Encounter (Signed)
Received refill request from pharmacy requesting refills for alprazolam 0.5 mg . Pt last seen 10/2014  . Please advise Thanks

## 2014-12-08 ENCOUNTER — Other Ambulatory Visit: Payer: Self-pay | Admitting: *Deleted

## 2014-12-08 DIAGNOSIS — R200 Anesthesia of skin: Secondary | ICD-10-CM

## 2014-12-08 NOTE — Telephone Encounter (Signed)
[facility] called stated that they faxed over refill request for Xanax and haven't heard anything from our office. She will try faxing it again

## 2014-12-14 ENCOUNTER — Other Ambulatory Visit: Payer: Self-pay

## 2014-12-14 MED ORDER — LEVOTHYROXINE SODIUM 75 MCG PO TABS: ORAL_TABLET | ORAL | Status: DC

## 2014-12-16 ENCOUNTER — Telehealth: Payer: Self-pay | Admitting: Hematology & Oncology

## 2014-12-16 ENCOUNTER — Other Ambulatory Visit: Payer: Self-pay | Admitting: Family

## 2014-12-16 DIAGNOSIS — D509 Iron deficiency anemia, unspecified: Secondary | ICD-10-CM

## 2014-12-16 NOTE — Telephone Encounter (Signed)
Left pt message with 4-20 appointment

## 2014-12-30 ENCOUNTER — Ambulatory Visit (INDEPENDENT_AMBULATORY_CARE_PROVIDER_SITE_OTHER): Payer: Managed Care, Other (non HMO) | Admitting: Neurology

## 2014-12-30 DIAGNOSIS — R200 Anesthesia of skin: Secondary | ICD-10-CM

## 2014-12-30 DIAGNOSIS — R202 Paresthesia of skin: Secondary | ICD-10-CM

## 2014-12-30 DIAGNOSIS — G5603 Carpal tunnel syndrome, bilateral upper limbs: Secondary | ICD-10-CM

## 2014-12-30 NOTE — Procedures (Signed)
[facility]  [address]  [place], [place] [address] Tel: [phone] Fax:  [phone] Test Date:  12/30/2014  Patient: Olivia Mosley DOB: 06-26-1956 Physician: Narda Amber, DO  Sex: Female Height: 5\' 4"  Ref Phys: Scarlette Calico  ID#: [id number] Temp: 32.0C Technician: Laureen Ochs R. NCS T.   Patient Complaints: Patient is a 59 year female her for evaluation of bilateral hand paresthesias and left thigh numbness.  NCV & EMG Findings: Extensive electrodiagnostic testing of the left upper extremity, left lower extremity and additional studies of the right upper extremity shows:  1. Left median sensory response is prolonged with preserved amplitude. Right median sensory response is within normal limits, however Palmer sensory responses are abnormal. Left ulnar sensory response is within normal limits. 2. Bilateral median and left ulnar motor responses are within normal limits.  3. Left sural and superficial peroneal sensory responses are within normal limits.  4. Left tibial and peroneal motor responses are within normal limits.  5. There is no evidence of active or chronic motor axon loss changes affecting any of the tested muscles. Motor unit configuration and recruitment pattern is within normal limits.   Impression: 1. Bilateral median neuropathy at or distal to the wrist, consistent with the clinical diagnosis of carpal tunnel syndrome. Overall, these findings are mild in degree electrically and worse on the left. 2. There is no evidence of a cervical or lumbosacral radiculopathy affecting the left side.   ___________________________ Narda Amber, DO    Nerve Conduction Studies Anti Sensory Summary Table   Site NR Peak (ms) Norm Peak (ms) P-T Amp (V) Norm P-T Amp  Left Median Anti Sensory (2nd Digit)  32C  Wrist    4.2 <3.6 16.8 >15  Right Median Anti Sensory (2nd Digit)  32C  Wrist    3.6 <3.6 31.7 >15  Left Sup Peroneal Anti Sensory (Ant Lat  Mall)  33C  12 cm    2.5 <4.6 17.9 >4  Left Sural Anti Sensory (Lat Mall)  Calf    3.5 <4.6 11.0 >4  Left Ulnar Anti Sensory (5th Digit)  32C  Wrist    2.6 <3.1 26.8 >10   Motor Summary Table   Site NR Onset (ms) Norm Onset (ms) O-P Amp (mV) Norm O-P Amp Site1 Site2 Delta-0 (ms) Dist (cm) Vel (m/s) Norm Vel (m/s)  Left Median Motor (Abd Poll Brev)  32C  Wrist    3.6 <4.0 11.6 >6 Elbow Wrist 4.7 24.0 51 >50  Elbow    8.3  11.0         Right Median Motor (Abd Poll Brev)  32C  Wrist    3.2 <4.0 6.5 >6 Elbow Wrist 4.8 25.0 52 >50  Elbow    8.0  6.4         Left Peroneal Motor (Ext Dig Brev)  33C  Ankle    4.5 <6.0 3.9 >2.5 B Fib Ankle 6.7 33.0 49 >40  B Fib    11.2  3.4  Poplt B Fib 1.7 10.0 59 >40  Poplt    12.9  3.2         Left Peroneal TA Motor (Tib Ant)  33C  Fib Head    2.3 <4.5 5.1 >3 Poplit Fib Head 1.5 8.5 57 >40  Poplit    3.8  4.7         Left Tibial Motor (Abd Hall Brev)  33C  Ankle    3.4 <6.0 16.7 >4 Knee  Ankle 8.4 39.0 46 >40  Knee    11.8  13.0         Left Ulnar Motor (Abd Dig Minimi)  32C  Wrist    1.8 <3.1 8.4 >7 B Elbow Wrist 3.7 21.5 58 >50  B Elbow    5.5  7.5  A Elbow B Elbow 1.9 10.0 53 >50  A Elbow    7.4  7.3          Comparison Summary Table   Site NR Peak (ms) Norm Peak (ms) P-T Amp (V) Site1 Site2 Delta-P (ms) Norm Delta (ms)  Right Median/Ulnar Palm Comparison (Wrist - 8cm)  32C  Median Palm    2.3 <2.2 53.8 Median Palm Ulnar Palm 0.8   Ulnar Palm    1.5 <2.2 26.9       H Reflex Studies   NR H-Lat (ms) Lat Norm (ms) L-R H-Lat (ms)  Left Tibial (Gastroc)  33C     34.15 <35 0.00   EMG  Side Muscle Ins Act Fibs Psw Fasc Number Recrt Dur Dur. Amp Amp. Poly Poly. Comment  Left 1stDorInt Nml Nml Nml Nml Nml Nml Nml Nml Nml Nml Nml Nml N/A  Left Abd Poll Brev Nml Nml Nml Nml Nml Nml Nml Nml Nml Nml Nml Nml N/A  Left Ext Indicis Nml Nml Nml Nml Nml Nml Nml Nml Nml Nml Nml Nml N/A  Left PronatorTeres Nml Nml Nml Nml Nml Nml Nml Nml Nml Nml  Nml Nml N/A  Left Biceps Nml Nml Nml Nml Nml Nml Nml Nml Nml Nml Nml Nml N/A  Left Triceps Nml Nml Nml Nml Nml Nml Nml Nml Nml Nml Nml Nml N/A  Left Deltoid Nml Nml Nml Nml Nml Nml Nml Nml Nml Nml Nml Nml N/A  Left AntTibialis Nml Nml Nml Nml Nml Nml Nml Nml Nml Nml Nml Nml N/A  Left Gastroc Nml Nml Nml Nml Nml Nml Nml Nml Nml Nml Nml Nml N/A  Left Flex Dig Long Nml Nml Nml Nml Nml Nml Nml Nml Nml Nml Nml Nml N/A  Left RectFemoris Nml Nml Nml Nml Nml Nml Nml Nml Nml Nml Nml Nml N/A  Right Abd Poll Brev Nml Nml Nml Nml Nml Nml Nml Nml Nml Nml Nml Nml N/A  Right PronatorTeres Nml Nml Nml Nml Nml Nml Nml Nml Nml Nml Nml Nml N/A  Left GluteusMed Nml Nml Nml Nml Nml Nml Nml Nml Nml Nml Nml Nml N/A      Waveforms:

## 2015-01-11 ENCOUNTER — Telehealth: Payer: Self-pay | Admitting: Hematology & Oncology

## 2015-01-11 NOTE — Telephone Encounter (Signed)
Pt moved 4-20 to 5-27

## 2015-01-12 ENCOUNTER — Ambulatory Visit: Payer: Managed Care, Other (non HMO) | Admitting: Hematology & Oncology

## 2015-01-12 ENCOUNTER — Other Ambulatory Visit: Payer: Managed Care, Other (non HMO)

## 2015-01-27 ENCOUNTER — Other Ambulatory Visit: Payer: Self-pay | Admitting: Internal Medicine

## 2015-01-27 ENCOUNTER — Telehealth: Payer: Self-pay | Admitting: Internal Medicine

## 2015-01-27 MED ORDER — AZITHROMYCIN 500 MG PO TABS: 500.0000 mg | ORAL_TABLET | Freq: Every day | ORAL | Status: DC

## 2015-01-27 NOTE — Telephone Encounter (Signed)
Patient states she is on vacation.  She believes that she has a sinus infection.  She states she is coughing up flem and has ear popping.  She is requesting him to send an antibiotic to [facility] in [place].

## 2015-01-27 NOTE — Telephone Encounter (Signed)
done

## 2015-01-27 NOTE — Telephone Encounter (Signed)
Pt.notified

## 2015-02-17 ENCOUNTER — Telehealth: Payer: Self-pay | Admitting: Hematology & Oncology

## 2015-02-17 NOTE — Telephone Encounter (Signed)
Pt canceled 5/27 and rescheduled to 6/23.

## 2015-02-18 ENCOUNTER — Other Ambulatory Visit: Payer: Managed Care, Other (non HMO)

## 2015-02-18 ENCOUNTER — Ambulatory Visit: Payer: Managed Care, Other (non HMO) | Admitting: Hematology & Oncology

## 2015-03-17 ENCOUNTER — Encounter: Payer: Self-pay | Admitting: Family

## 2015-03-17 ENCOUNTER — Other Ambulatory Visit (HOSPITAL_BASED_OUTPATIENT_CLINIC_OR_DEPARTMENT_OTHER): Payer: Managed Care, Other (non HMO)

## 2015-03-17 ENCOUNTER — Ambulatory Visit (HOSPITAL_BASED_OUTPATIENT_CLINIC_OR_DEPARTMENT_OTHER): Payer: Managed Care, Other (non HMO) | Admitting: Family

## 2015-03-17 VITALS — BP 129/68 | HR 85 | Temp 97.8°F | Resp 18

## 2015-03-17 DIAGNOSIS — D509 Iron deficiency anemia, unspecified: Secondary | ICD-10-CM

## 2015-03-17 LAB — CBC WITH DIFFERENTIAL (CANCER CENTER ONLY)
BASO#: 0 10*3/uL (ref 0.0–0.2)
BASO%: 0.3 % (ref 0.0–2.0)
EOS%: 3.7 % (ref 0.0–7.0)
Eosinophils Absolute: 0.3 10*3/uL (ref 0.0–0.5)
HCT: 41.1 % (ref 34.8–46.6)
HGB: 13.8 g/dL (ref 11.6–15.9)
LYMPH#: 1.5 10*3/uL (ref 0.9–3.3)
LYMPH%: 20.5 % (ref 14.0–48.0)
MCH: 29.8 pg (ref 26.0–34.0)
MCHC: 33.6 g/dL (ref 32.0–36.0)
MCV: 89 fL (ref 81–101)
MONO#: 0.5 10*3/uL (ref 0.1–0.9)
MONO%: 7.2 % (ref 0.0–13.0)
NEUT#: 5 10*3/uL (ref 1.5–6.5)
NEUT%: 68.3 % (ref 39.6–80.0)
Platelets: 282 10*3/uL (ref 145–400)
RBC: 4.63 10*6/uL (ref 3.70–5.32)
RDW: 13.5 % (ref 11.1–15.7)
WBC: 7.3 10*3/uL (ref 3.9–10.0)

## 2015-03-17 NOTE — Progress Notes (Signed)
Hematology and Oncology Follow Up Visit  Olivia Mosley [id number] [birth year] 59 y.o. 03/17/2015   Principle Diagnosis:  Iron deficiency anemia  Current Therapy:   IV iron as indicated    Interim History: Olivia Mosley is here today for a follow-up. She is feeling a little fatigued today.  No fever, chills, n/v, cough, rash, headache, dizziness, SOB, chest pain, palpitations, abdominal pain, constipation, diarrhea, blood in urine or stool.  She stopped taking her synthroid with her Nexium and her hair quit falling out. She is very happy about this.  No episodes of bleeding.  She has had no swelling, tenderness, numbness or tingling in her extremities. No new aches or pains.  Her appetite is good and she is staying hydrated.   Medications:    Medication List       This list is accurate as of: 03/17/15  3:30 PM.  Always use your most recent med list.               acetaminophen-codeine 300-30 MG per tablet  Commonly known as:  TYLENOL #3     ALPRAZolam 0.5 MG tablet  Commonly known as:  XANAX  Take 1 tablet (0.5 mg total) by mouth 2 (two) times daily as needed for anxiety.     atorvastatin 40 MG tablet  Commonly known as:  LIPITOR  TAKE 1 TABLET BY MOUTH EVERY DAY (NEED TO SEE DR BEFORE MORE REFILLS)     azithromycin 500 MG tablet  Commonly known as:  ZITHROMAX  Take 1 tablet (500 mg total) by mouth daily.     carisoprodol 350 MG tablet  Commonly known as:  SOMA  Take 350 mg by mouth 3 (three) times daily.     Cholecalciferol 2000 UNITS Tabs  Take 1 tablet (2,000 Units total) by mouth daily.     clobetasol cream 0.05 %  Commonly known as:  TEMOVATE     escitalopram 10 MG tablet  Commonly known as:  LEXAPRO  TAKE 1 TABLET BY MOUTH DAILY     esomeprazole 40 MG capsule  Commonly known as:  NEXIUM  TAKE 1 CAPSULE BY MOUTH DAILY (NEED TO SEE DR BEFORE MORE REFILLS)     fexofenadine 180 MG tablet  Commonly known as:  ALLEGRA  TAKE 1 TABLET BY MOUTH DAILY (NEED TO  SEE DR BEFORE MORE REFILLS)     FINACEA 15 % cream  Generic drug:  Azelaic Acid  APPLY 1/2 GRAM TWICE DAILY     fluticasone 50 MCG/ACT nasal spray  Commonly known as:  FLONASE  Place 1 spray into both nostrils daily.     levothyroxine 75 MCG tablet  Commonly known as:  SYNTHROID, LEVOTHROID  TAKE 1 TABLET BY MOUTH EVERY DAY     meloxicam 15 MG tablet  Commonly known as:  MOBIC     traZODone 50 MG tablet  Commonly known as:  DESYREL  TAKE 2 TABLETS (100MG  TOTAL) BY MOUTH AT BEDTIME AS NEEDED FOR SLEEP     tretinoin 0.025 % cream  Commonly known as:  RETIN-A  APPLY TOPICALLY AT BEDTIME (NEED TO SEE DR BEFORE MORE REFILLS)     triamcinolone cream 0.5 %  Commonly known as:  KENALOG  Apply 1 application topically 3 (three) times daily.        Allergies:  Allergies  Allergen Reactions  . Flagyl [Metronidazole Hcl] Hives    Past Medical History, Surgical history, Social history, and Family History were reviewed and updated.  Review of  Systems: All other 10 point review of systems is negative.   Physical Exam:  oral temperature is 97.8 F (36.6 C). Her blood pressure is 129/68 and her pulse is 85. Her respiration is 18.   Wt Readings from Last 3 Encounters:  11/10/14 240 lb (108.863 kg)  07/23/14 245 lb (111.131 kg)  07/09/14 242 lb (109.77 kg)    Ocular: Sclerae unicteric, pupils equal, round and reactive to light Ear-nose-throat: Oropharynx clear, dentition fair Lymphatic: No cervical or supraclavicular adenopathy Lungs no rales or rhonchi, good excursion bilaterally Heart regular rate and rhythm, no murmur appreciated Abd soft, nontender, positive bowel sounds MSK no focal spinal tenderness, no joint edema Neuro: non-focal, well-oriented, appropriate affect Breasts: Deferred  Lab Results  Component Value Date   WBC 7.3 03/17/2015   HGB 13.8 03/17/2015   HCT 41.1 03/17/2015   MCV 89 03/17/2015   PLT 282 03/17/2015   Lab Results  Component Value Date     FERRITIN 107 07/23/2014   IRON 57 07/23/2014   TIBC 279 07/23/2014   UIBC 222 07/23/2014   IRONPCTSAT 21 07/23/2014   Lab Results  Component Value Date   RETICCTPCT 1.6 07/23/2014   RBC 4.63 03/17/2015   RETICCTABS 68.3 07/23/2014   No results found for: KPAFRELGTCHN, LAMBDASER, KAPLAMBRATIO No results found for: IGGSERUM, IGA, IGMSERUM No results found for: Odetta Pink, SPEI   Chemistry      Component Value Date/Time   NA 141 11/10/2014 1153   K 4.3 11/10/2014 1153   CL 107 11/10/2014 1153   CO2 25 11/10/2014 1153   BUN 30* 11/10/2014 1153   CREATININE 0.71 11/10/2014 1153      Component Value Date/Time   CALCIUM 9.9 11/10/2014 1153   ALKPHOS 77 07/09/2014 1424   AST 21 07/09/2014 1424   ALT 25 07/09/2014 1424   BILITOT 0.4 07/09/2014 1424     Impression and Plan: Olivia Mosley is 59 year old female with iron deficiency. She is feeling a bit fatigued today. Her last dose of Feraheme was in February of last year. Her CBC looks good. We will see what her iron studies show.   We will see her back in 6 months for labs ad follow-up.  She knows to contact us with any questions or concerns. We can certainly see her sooner if need be.   Eliezer Bottom, NP 6/23/20163:30 PM

## 2015-03-18 LAB — IRON AND TIBC CHCC
%SAT: 25 % (ref 21–57)
Iron: 77 ug/dL (ref 41–142)
TIBC: 304 ug/dL (ref 236–444)
UIBC: 227 ug/dL (ref 120–384)

## 2015-03-18 LAB — FERRITIN CHCC: Ferritin: 82 ng/ml (ref 9–269)

## 2015-03-21 LAB — RETICULOCYTES (CHCC)
ABS Retic: 79.7 10*3/uL (ref 19.0–186.0)
RBC.: 4.69 MIL/uL (ref 3.87–5.11)
Retic Ct Pct: 1.7 % (ref 0.4–2.3)

## 2015-03-21 LAB — HEMOGLOBINOPATHY EVALUATION
Hemoglobin Other: 0 %
Hgb A2 Quant: 2.3 % (ref 2.2–3.2)
Hgb A: 97.7 % (ref 96.8–97.8)
Hgb F Quant: 0 % (ref 0.0–2.0)
Hgb S Quant: 0 %

## 2015-03-21 LAB — ERYTHROPOIETIN: Erythropoietin: 18.8 m[IU]/mL — ABNORMAL HIGH (ref 2.6–18.5)

## 2015-04-01 ENCOUNTER — Telehealth: Payer: Self-pay | Admitting: Internal Medicine

## 2015-04-01 NOTE — Telephone Encounter (Signed)
Patient has left a mychart message with [place] regarding whether or not she is due for an appointment. I do see that she was supposed to come in 3 weeks after her appointment in February. She also thinks she needs to come in for urinalysis and lab work. I don't see any orders from [place]. I will let her know that she is overdue for an appointment and that we will let her know next week about lab work.

## 2015-04-01 NOTE — Telephone Encounter (Signed)
Noted  

## 2015-04-13 ENCOUNTER — Ambulatory Visit: Payer: Managed Care, Other (non HMO) | Admitting: Internal Medicine

## 2015-04-14 ENCOUNTER — Ambulatory Visit: Payer: Managed Care, Other (non HMO) | Admitting: Internal Medicine

## 2015-04-20 ENCOUNTER — Ambulatory Visit (INDEPENDENT_AMBULATORY_CARE_PROVIDER_SITE_OTHER): Payer: Managed Care, Other (non HMO) | Admitting: Internal Medicine

## 2015-04-20 ENCOUNTER — Encounter: Payer: Self-pay | Admitting: Internal Medicine

## 2015-04-20 VITALS — BP 120/80 | HR 84 | Temp 98.2°F | Resp 16 | Ht 64.0 in | Wt 244.8 lb

## 2015-04-20 DIAGNOSIS — R739 Hyperglycemia, unspecified: Secondary | ICD-10-CM | POA: Diagnosis not present

## 2015-04-20 DIAGNOSIS — E785 Hyperlipidemia, unspecified: Secondary | ICD-10-CM

## 2015-04-20 DIAGNOSIS — E109 Type 1 diabetes mellitus without complications: Secondary | ICD-10-CM | POA: Diagnosis not present

## 2015-04-20 DIAGNOSIS — E038 Other specified hypothyroidism: Secondary | ICD-10-CM | POA: Diagnosis not present

## 2015-04-20 DIAGNOSIS — D509 Iron deficiency anemia, unspecified: Secondary | ICD-10-CM

## 2015-04-20 DIAGNOSIS — J3081 Allergic rhinitis due to animal (cat) (dog) hair and dander: Secondary | ICD-10-CM

## 2015-04-20 MED ORDER — AZELASTINE HCL 0.1 % NA SOLN: 2.0000 | Freq: Two times a day (BID) | NASAL | Status: DC

## 2015-04-20 NOTE — Progress Notes (Signed)
Subjective:  Patient ID: Olivia Mosley, female    DOB: 1955-11-14  Age: 59 y.o. MRN: [id number]  CC: Hypothyroidism   HPI Olivia Mosley presents for follow-up on hypothyroidism. She complains of a 4 pound weight gain over the last 5 months. She recently saw hematology and had a normal CBC. She complains of persistent runny nose, nasal congestion, sneezing, and popping in her ears. She is taking Sudafed and using Flonase nasal spray with some moderate relief in her symptoms.  Outpatient Prescriptions Prior to Visit  Medication Sig Dispense Refill  . ALPRAZolam (XANAX) 0.5 MG tablet Take 1 tablet (0.5 mg total) by mouth 2 (two) times daily as needed for anxiety. 60 tablet 3  . atorvastatin (LIPITOR) 40 MG tablet TAKE 1 TABLET BY MOUTH EVERY DAY (NEED TO SEE DR BEFORE MORE REFILLS) 90 tablet 3  . Cholecalciferol 2000 UNITS TABS Take 1 tablet (2,000 Units total) by mouth daily. 90 tablet 3  . clobetasol cream (TEMOVATE) 0.05 %   0  . escitalopram (LEXAPRO) 10 MG tablet TAKE 1 TABLET BY MOUTH DAILY 90 tablet 3  . esomeprazole (NEXIUM) 40 MG capsule TAKE 1 CAPSULE BY MOUTH DAILY (NEED TO SEE DR BEFORE MORE REFILLS) 90 capsule 3  . fexofenadine (ALLEGRA) 180 MG tablet TAKE 1 TABLET BY MOUTH DAILY (NEED TO SEE DR BEFORE MORE REFILLS) 90 tablet 3  . FINACEA 15 % cream APPLY 1/2 GRAM TWICE DAILY 50 g 0  . fluticasone (FLONASE) 50 MCG/ACT nasal spray Place 1 spray into both nostrils daily. 16 g 6  . levothyroxine (SYNTHROID, LEVOTHROID) 75 MCG tablet TAKE 1 TABLET BY MOUTH EVERY DAY 90 tablet 1  . meloxicam (MOBIC) 15 MG tablet     . traZODone (DESYREL) 50 MG tablet TAKE 2 TABLETS (100MG  TOTAL) BY MOUTH AT BEDTIME AS NEEDED FOR SLEEP 180 tablet 3  . tretinoin (RETIN-A) 0.025 % cream APPLY TOPICALLY AT BEDTIME (NEED TO SEE DR BEFORE MORE REFILLS) 45 g 0  . triamcinolone cream (KENALOG) 0.5 % Apply 1 application topically 3 (three) times daily. 30 g 1  . carisoprodol (SOMA) 350 MG tablet Take 350 mg by  mouth 3 (three) times daily.     Marland Kitchen acetaminophen-codeine (TYLENOL #3) 300-30 MG per tablet   0  . azithromycin (ZITHROMAX) 500 MG tablet Take 1 tablet (500 mg total) by mouth daily. (Patient not taking: Reported on 04/20/2015) 3 tablet 0   No facility-administered medications prior to visit.    ROS Review of Systems  Constitutional: Positive for unexpected weight change. Negative for fever, chills, diaphoresis, appetite change and fatigue.  HENT: Positive for congestion, postnasal drip, rhinorrhea and sneezing. Negative for facial swelling, sinus pressure, trouble swallowing and voice change.   Eyes: Negative.   Respiratory: Negative.  Negative for cough, choking, chest tightness, shortness of breath and stridor.   Cardiovascular: Negative.  Negative for chest pain, palpitations and leg swelling.  Gastrointestinal: Negative.  Negative for nausea, vomiting, abdominal pain, diarrhea, constipation and blood in stool.  Endocrine: Negative.   Genitourinary: Negative.   Musculoskeletal: Negative.   Skin: Negative.   Allergic/Immunologic: Negative.   Neurological: Negative.  Negative for dizziness, tremors, syncope, light-headedness, numbness and headaches.  Hematological: Negative.   Psychiatric/Behavioral: Negative.     Objective:  BP 120/80 mmHg  Pulse 84  Temp(Src) 98.2 F (36.8 C) (Oral)  Resp 16  Ht 5\' 4"  (1.626 m)  Wt 244 lb 12 oz (111.018 kg)  BMI 41.99 kg/m2  SpO2 98%  BP Readings from Last 3 Encounters:  04/20/15 120/80  03/17/15 129/68  11/10/14 142/76    Wt Readings from Last 3 Encounters:  04/20/15 244 lb 12 oz (111.018 kg)  11/10/14 240 lb (108.863 kg)  07/23/14 245 lb (111.131 kg)    Physical Exam  Constitutional: She is oriented to person, place, and time.  Non-toxic appearance. She does not have a sickly appearance. She does not appear ill. No distress.  HENT:  Head: Normocephalic and atraumatic.  Right Ear: Hearing, tympanic membrane, external ear and ear  canal normal.  Left Ear: Hearing, tympanic membrane, external ear and ear canal normal.  Nose: Mucosal edema present. No rhinorrhea, sinus tenderness or nasal deformity. Right sinus exhibits no maxillary sinus tenderness and no frontal sinus tenderness. Left sinus exhibits no maxillary sinus tenderness and no frontal sinus tenderness.  Mouth/Throat: Oropharynx is clear and moist and mucous membranes are normal. Mucous membranes are not pale, not dry and not cyanotic. No oral lesions. No trismus in the jaw. No uvula swelling. No oropharyngeal exudate, posterior oropharyngeal edema, posterior oropharyngeal erythema or tonsillar abscesses.  Eyes: Conjunctivae are normal. Right eye exhibits no discharge. Left eye exhibits no discharge. No scleral icterus.  Neck: Normal range of motion. Neck supple. No JVD present. No tracheal deviation present. No thyromegaly present.  Cardiovascular: Normal rate, regular rhythm, normal heart sounds and intact distal pulses.  Exam reveals no gallop and no friction rub.   No murmur heard. Pulmonary/Chest: Effort normal and breath sounds normal. No stridor. No respiratory distress. She has no wheezes. She has no rales. She exhibits no tenderness.  Abdominal: Soft. Bowel sounds are normal. She exhibits no distension and no mass. There is no tenderness. There is no rebound and no guarding.  Musculoskeletal: Normal range of motion. She exhibits no edema or tenderness.  Lymphadenopathy:    She has no cervical adenopathy.  Neurological: She is oriented to person, place, and time.  Skin: Skin is warm and dry. No rash noted. She is not diaphoretic. No erythema. No pallor.  Vitals reviewed.   Lab Results  Component Value Date   WBC 7.3 03/17/2015   HGB 13.8 03/17/2015   HCT 41.1 03/17/2015   PLT 282 03/17/2015   GLUCOSE 95 11/10/2014   CHOL 182 07/09/2014   TRIG 172.0* 07/09/2014   HDL 42.70 07/09/2014   LDLCALC 105* 07/09/2014   ALT 25 07/09/2014   AST 21 07/09/2014    NA 141 11/10/2014   K 4.3 11/10/2014   CL 107 11/10/2014   CREATININE 0.71 11/10/2014   BUN 30* 11/10/2014   CO2 25 11/10/2014   TSH 2.72 11/10/2014   HGBA1C 6.0 07/09/2014    No results found.  Assessment & Plan:   Adaleen was seen today for hypothyroidism.  Diagnoses and all orders for this visit:  Type 1 diabetes mellitus without complication- I will recheck her A1c and will treat if indicated. Will also monitor her lipids and renal function. Orders: -     Lipid panel; Future -     Basic metabolic panel; Future -     Hemoglobin A1c; Future -     Microalbumin / creatinine urine ratio; Future -     Urinalysis, Routine w reflex microscopic (not at [facility]); Future  Other specified hypothyroidism- I will recheck her TSH and adjust her dose if needed. Orders: -     TSH; Future  Anemia, iron deficiency  Hyperglycemia Orders: -     Basic metabolic panel; Future  Hyperlipidemia with target LDL less than 130 Orders: -     Lipid panel; Future  Allergic rhinitis due to animal hair and dander- we'll add Astelin nasal spray for additional symptom relief. Orders: -     azelastine (ASTELIN) 0.1 % nasal spray; Place 2 sprays into both nostrils 2 (two) times daily. Use in each nostril as directed   I have discontinued Ms. Kees carisoprodol and azithromycin. I am also having her start on azelastine. Additionally, I am having her maintain her meloxicam, FINACEA, tretinoin, fexofenadine, fluticasone, atorvastatin, escitalopram, esomeprazole, traZODone, Cholecalciferol, triamcinolone cream, ALPRAZolam, acetaminophen-codeine, clobetasol cream, and levothyroxine.  Meds ordered this encounter  Medications  . azelastine (ASTELIN) 0.1 % nasal spray    Sig: Place 2 sprays into both nostrils 2 (two) times daily. Use in each nostril as directed    Dispense:  30 mL    Refill:  11     Follow-up: Return in about 4 months (around 08/21/2015).  Scarlette Calico, MD

## 2015-04-20 NOTE — Patient Instructions (Signed)
Hypothyroidism The thyroid is a large gland located in the lower front of your neck. The thyroid gland helps control metabolism. Metabolism is how your body handles food. It controls metabolism with the hormone thyroxine. When this gland is underactive (hypothyroid), it produces too little hormone.  CAUSES These include:   Absence or destruction of thyroid tissue.  Goiter due to iodine deficiency.  Goiter due to medications.  Congenital defects (since birth).  Problems with the pituitary. This causes a lack of TSH (thyroid stimulating hormone). This hormone tells the thyroid to turn out more hormone. SYMPTOMS  Lethargy (feeling as though you have no energy)  Cold intolerance  Weight gain (in spite of normal food intake)  Dry skin  Coarse hair  Menstrual irregularity (if severe, may lead to infertility)  Slowing of thought processes Cardiac problems are also caused by insufficient amounts of thyroid hormone. Hypothyroidism in the newborn is cretinism, and is an extreme form. It is important that this form be treated adequately and immediately or it will lead rapidly to retarded physical and mental development. DIAGNOSIS  To prove hypothyroidism, your caregiver may do blood tests and ultrasound tests. Sometimes the signs are hidden. It may be necessary for your caregiver to watch this illness with blood tests either before or after diagnosis and treatment. TREATMENT  Low levels of thyroid hormone are increased by using synthetic thyroid hormone. This is a safe, effective treatment. It usually takes about four weeks to gain the full effects of the medication. After you have the full effect of the medication, it will generally take another four weeks for problems to leave. Your caregiver may start you on low doses. If you have had heart problems the dose may be gradually increased. It is generally not an emergency to get rapidly to normal. HOME CARE INSTRUCTIONS   Take your  medications as your caregiver suggests. Let your caregiver know of any medications you are taking or start taking. Your caregiver will help you with dosage schedules.  As your condition improves, your dosage needs may increase. It will be necessary to have continuing blood tests as suggested by your caregiver.  Report all suspected medication side effects to your caregiver. SEEK MEDICAL CARE IF: Seek medical care if you develop:  Sweating.  Tremulousness (tremors).  Anxiety.  Rapid weight loss.  Heat intolerance.  Emotional swings.  Diarrhea.  Weakness. SEEK IMMEDIATE MEDICAL CARE IF:  You develop chest pain, an irregular heart beat (palpitations), or a rapid heart beat. MAKE SURE YOU:   Understand these instructions.  Will watch your condition.  Will get help right away if you are not doing well or get worse. Document Released: 09/10/2005 Document Revised: 12/03/2011 Document Reviewed: 04/30/2008 Exit[employer] Patient Information 2015 [employer]. This information is not intended to replace advice given to you by your health care provider. Make sure you discuss any questions you have with your health care provider.  

## 2015-04-20 NOTE — Progress Notes (Signed)
Pre visit review using our clinic review tool, if applicable. No additional management support is needed unless otherwise documented below in the visit note. 

## 2015-04-28 ENCOUNTER — Telehealth: Payer: Self-pay | Admitting: Internal Medicine

## 2015-04-28 DIAGNOSIS — J3081 Allergic rhinitis due to animal (cat) (dog) hair and dander: Secondary | ICD-10-CM

## 2015-04-28 MED ORDER — AZELASTINE HCL 0.1 % NA SOLN: 2.0000 | Freq: Two times a day (BID) | NASAL | Status: DC

## 2015-04-28 NOTE — Telephone Encounter (Signed)
Patient called and the prescription for azelastine (ASTELIN) 0.1 % nasal spray [[id number] needs to go to the [facility] on [address]

## 2015-04-28 NOTE — Telephone Encounter (Signed)
Rx approved and sent script to [facility] as requested per patient.

## 2015-05-05 ENCOUNTER — Other Ambulatory Visit (INDEPENDENT_AMBULATORY_CARE_PROVIDER_SITE_OTHER): Payer: Managed Care, Other (non HMO)

## 2015-05-05 DIAGNOSIS — E785 Hyperlipidemia, unspecified: Secondary | ICD-10-CM | POA: Diagnosis not present

## 2015-05-05 DIAGNOSIS — E109 Type 1 diabetes mellitus without complications: Secondary | ICD-10-CM

## 2015-05-05 DIAGNOSIS — E038 Other specified hypothyroidism: Secondary | ICD-10-CM | POA: Diagnosis not present

## 2015-05-05 DIAGNOSIS — R739 Hyperglycemia, unspecified: Secondary | ICD-10-CM | POA: Diagnosis not present

## 2015-05-05 LAB — HEMOGLOBIN A1C: Hgb A1c MFr Bld: 5.9 % (ref 4.6–6.5)

## 2015-05-05 LAB — BASIC METABOLIC PANEL
BUN: 30 mg/dL — ABNORMAL HIGH (ref 6–23)
CO2: 25 mEq/L (ref 19–32)
Calcium: 9 mg/dL (ref 8.4–10.5)
Chloride: 107 mEq/L (ref 96–112)
Creatinine, Ser: 0.95 mg/dL (ref 0.40–1.20)
GFR: 63.99 mL/min (ref >60.00)
Glucose, Bld: 92 mg/dL (ref 70–99)
Potassium: 4.5 mEq/L (ref 3.5–5.1)
Sodium: 140 mEq/L (ref 135–145)

## 2015-05-05 LAB — LIPID PANEL
Cholesterol: 164 mg/dL (ref 0–200)
HDL: 51.1 mg/dL (ref >39.00)
LDL Cholesterol: 89 mg/dL (ref 0–99)
NonHDL: 113.26
Total CHOL/HDL Ratio: 3
Triglycerides: 123 mg/dL (ref 0.0–149.0)
VLDL: 24.6 mg/dL (ref 0.0–40.0)

## 2015-05-05 LAB — URINALYSIS, ROUTINE W REFLEX MICROSCOPIC
Bilirubin Urine: NEGATIVE
Ketones, ur: NEGATIVE
Leukocytes, UA: NEGATIVE
Nitrite: NEGATIVE
Specific Gravity, Urine: >=1.03 — AB (ref 1.000–1.030)
Urine Glucose: NEGATIVE
Urobilinogen, UA: 0.2 (ref 0.0–1.0)
pH: 6 (ref 5.0–8.0)

## 2015-05-05 LAB — TSH: TSH: 1.55 u[IU]/mL (ref 0.35–4.50)

## 2015-05-06 ENCOUNTER — Other Ambulatory Visit: Payer: Self-pay

## 2015-05-06 DIAGNOSIS — F418 Other specified anxiety disorders: Secondary | ICD-10-CM

## 2015-05-06 DIAGNOSIS — G4700 Insomnia, unspecified: Secondary | ICD-10-CM

## 2015-05-06 NOTE — Telephone Encounter (Signed)
OK to RF? Last OV 04/20/15

## 2015-05-07 MED ORDER — ALPRAZOLAM 0.5 MG PO TABS: 0.5000 mg | ORAL_TABLET | Freq: Two times a day (BID) | ORAL | Status: DC | PRN

## 2015-05-08 ENCOUNTER — Encounter: Payer: Self-pay | Admitting: Internal Medicine

## 2015-07-14 ENCOUNTER — Other Ambulatory Visit: Payer: Self-pay | Admitting: Internal Medicine

## 2015-08-31 ENCOUNTER — Other Ambulatory Visit: Payer: Managed Care, Other (non HMO)

## 2015-08-31 ENCOUNTER — Ambulatory Visit: Payer: Managed Care, Other (non HMO) | Admitting: Family

## 2015-09-01 ENCOUNTER — Ambulatory Visit (HOSPITAL_BASED_OUTPATIENT_CLINIC_OR_DEPARTMENT_OTHER): Payer: Managed Care, Other (non HMO) | Admitting: Family

## 2015-09-01 ENCOUNTER — Encounter: Payer: Self-pay | Admitting: Family

## 2015-09-01 ENCOUNTER — Other Ambulatory Visit (HOSPITAL_BASED_OUTPATIENT_CLINIC_OR_DEPARTMENT_OTHER): Payer: Managed Care, Other (non HMO)

## 2015-09-01 VITALS — BP 138/71 | HR 92 | Temp 97.4°F | Resp 18 | Ht 64.0 in | Wt 244.0 lb

## 2015-09-01 DIAGNOSIS — D509 Iron deficiency anemia, unspecified: Secondary | ICD-10-CM

## 2015-09-01 LAB — CBC WITH DIFFERENTIAL (CANCER CENTER ONLY)
BASO#: 0 10*3/uL (ref 0.0–0.2)
BASO%: 0.2 % (ref 0.0–2.0)
EOS%: 1.2 % (ref 0.0–7.0)
Eosinophils Absolute: 0.1 10*3/uL (ref 0.0–0.5)
HCT: 42.1 % (ref 34.8–46.6)
HGB: 14 g/dL (ref 11.6–15.9)
LYMPH#: 1.7 10*3/uL (ref 0.9–3.3)
LYMPH%: 21.1 % (ref 14.0–48.0)
MCH: 29.2 pg (ref 26.0–34.0)
MCHC: 33.3 g/dL (ref 32.0–36.0)
MCV: 88 fL (ref 81–101)
MONO#: 0.4 10*3/uL (ref 0.1–0.9)
MONO%: 5.2 % (ref 0.0–13.0)
NEUT#: 5.8 10*3/uL (ref 1.5–6.5)
NEUT%: 72.3 % (ref 39.6–80.0)
Platelets: 360 10*3/uL (ref 145–400)
RBC: 4.79 10*6/uL (ref 3.70–5.32)
RDW: 13.4 % (ref 11.1–15.7)
WBC: 8 10*3/uL (ref 3.9–10.0)

## 2015-09-01 LAB — COMPREHENSIVE METABOLIC PANEL
ALT: 15 U/L (ref 0–55)
AST: 13 U/L (ref 5–34)
Albumin: 4.1 g/dL (ref 3.5–5.0)
Alkaline Phosphatase: 90 U/L (ref 40–150)
Anion Gap: 13 mEq/L — ABNORMAL HIGH (ref 3–11)
BUN: 29.4 mg/dL — ABNORMAL HIGH (ref 7.0–26.0)
CO2: 18 mEq/L — ABNORMAL LOW (ref 22–29)
Calcium: 10.1 mg/dL (ref 8.4–10.4)
Chloride: 107 mEq/L (ref 98–109)
Creatinine: 0.9 mg/dL (ref 0.6–1.1)
EGFR: 74 mL/min/{1.73_m2} — ABNORMAL LOW (ref >90)
Glucose: 143 mg/dl — ABNORMAL HIGH (ref 70–140)
Potassium: 4.4 mEq/L (ref 3.5–5.1)
Sodium: 138 mEq/L (ref 136–145)
Total Bilirubin: 0.33 mg/dL (ref 0.20–1.20)
Total Protein: 7.5 g/dL (ref 6.4–8.3)

## 2015-09-01 LAB — IRON AND TIBC
%SAT: 25 % (ref 21–57)
Iron: 84 ug/dL (ref 41–142)
TIBC: 335 ug/dL (ref 236–444)
UIBC: 251 ug/dL (ref 120–384)

## 2015-09-01 LAB — FERRITIN: Ferritin: 78 ng/ml (ref 9–269)

## 2015-09-01 LAB — RETICULOCYTES
ABS Retic: 77.8 10*3/uL (ref 19.0–186.0)
RBC.: 4.86 MIL/uL (ref 3.87–5.11)
Retic Ct Pct: 1.6 % (ref 0.4–2.3)

## 2015-09-01 NOTE — Progress Notes (Signed)
Hematology and Oncology Follow Up Visit  Olivia Mosley [id number] [birth year] 59 y.o. 09/01/2015   Principle Diagnosis:  Iron deficiency anemia  Current Therapy:   IV iron as indicated    Interim History: Olivia Mosley is here today for a follow-up. She is having some mild fatigue. She has chronic back pain and bursitis and recent;y received steroid injections. This has given her a little boost of energy but she can now tell its beginning to wear off.  She has started swimming a couple times during the week and is counting her calories. She is eating healthier and staying well hydrated with water. Her weight is stable.  No fever, chills, n/v, cough, rash, headache, dizziness, SOB, chest pain, palpitations, abdominal pain or changes in bowel or bladder habits.  She has had no swelling, tenderness, numbness or tingling in her extremities.   Medications:    Medication List       This list is accurate as of: 09/01/15 11:34 AM.  Always use your most recent med list.               acetaminophen-codeine 300-30 MG tablet  Commonly known as:  TYLENOL #3     ALPRAZolam 0.5 MG tablet  Commonly known as:  XANAX  Take 1 tablet (0.5 mg total) by mouth 2 (two) times daily as needed for anxiety.     atorvastatin 40 MG tablet  Commonly known as:  LIPITOR  TAKE 1 TABLET BY MOUTH EVERY DAY (NEED TO SEE DR BEFORE MORE REFILLS)     azelastine 0.1 % nasal spray  Commonly known as:  ASTELIN  Place 2 sprays into both nostrils 2 (two) times daily. Use in each nostril as directed     Cholecalciferol 2000 UNITS Tabs  Take 1 tablet (2,000 Units total) by mouth daily.     clobetasol cream 0.05 %  Commonly known as:  TEMOVATE     escitalopram 10 MG tablet  Commonly known as:  LEXAPRO  TAKE 1 TABLET BY MOUTH DAILY     esomeprazole 40 MG capsule  Commonly known as:  NEXIUM  TAKE 1 CAPSULE BY MOUTH DAILY (NEED TO SEE DR BEFORE MORE REFILLS)     fexofenadine 180 MG tablet  Commonly known as:   ALLEGRA  TAKE 1 TABLET BY MOUTH DAILY (NEED TO SEE DR BEFORE MORE REFILLS)     FINACEA 15 % cream  Generic drug:  Azelaic Acid  APPLY 1/2 GRAM TWICE DAILY     fluticasone 50 MCG/ACT nasal spray  Commonly known as:  FLONASE  Place 1 spray into both nostrils daily.     levothyroxine 75 MCG tablet  Commonly known as:  SYNTHROID, LEVOTHROID  TAKE 1 TABLET BY MOUTH EVERY DAY     meloxicam 15 MG tablet  Commonly known as:  MOBIC     traZODone 50 MG tablet  Commonly known as:  DESYREL  TAKE 2 TABLETS BY MOUTH AT BEDTIME AS NEEDED FOR SLEEP     tretinoin 0.025 % cream  Commonly known as:  RETIN-A  APPLY TOPICALLY AT BEDTIME (NEED TO SEE DR BEFORE MORE REFILLS)     triamcinolone cream 0.5 %  Commonly known as:  KENALOG  Apply 1 application topically 3 (three) times daily.        Allergies:  Allergies  Allergen Reactions  . Flagyl [Metronidazole Hcl] Hives    Past Medical History, Surgical history, Social history, and Family History were reviewed and updated.  Review of Systems:  All other 10 point review of systems is negative.   Physical Exam:  vitals were not taken for this visit.  Wt Readings from Last 3 Encounters:  04/20/15 244 lb 12 oz (111.018 kg)  11/10/14 240 lb (108.863 kg)  07/23/14 245 lb (111.131 kg)    Ocular: Sclerae unicteric, pupils equal, round and reactive to light Ear-nose-throat: Oropharynx clear, dentition fair Lymphatic: No cervical or supraclavicular adenopathy Lungs no rales or rhonchi, good excursion bilaterally Heart regular rate and rhythm, no murmur appreciated Abd soft, nontender, positive bowel sounds MSK no focal spinal tenderness, no joint edema Neuro: non-focal, well-oriented, appropriate affect Breasts: Deferred  Lab Results  Component Value Date   WBC 7.3 03/17/2015   HGB 13.8 03/17/2015   HCT 41.1 03/17/2015   MCV 89 03/17/2015   PLT 282 03/17/2015   Lab Results  Component Value Date   FERRITIN 82 03/17/2015   IRON  77 03/17/2015   TIBC 304 03/17/2015   UIBC 227 03/17/2015   IRONPCTSAT 25 03/17/2015   Lab Results  Component Value Date   RETICCTPCT 1.7 03/17/2015   RBC 4.69 03/17/2015   RETICCTABS 79.7 03/17/2015   No results found for: KPAFRELGTCHN, LAMBDASER, KAPLAMBRATIO No results found for: IGGSERUM, IGA, IGMSERUM No results found for: Ronnald Ramp, A1GS, Gilford Silvius, MSPIKE, SPEI   Chemistry      Component Value Date/Time   NA 140 05/05/2015 1219   K 4.5 05/05/2015 1219   CL 107 05/05/2015 1219   CO2 25 05/05/2015 1219   BUN 30* 05/05/2015 1219   CREATININE 0.95 05/05/2015 1219      Component Value Date/Time   CALCIUM 9.0 05/05/2015 1219   ALKPHOS 77 07/09/2014 1424   AST 21 07/09/2014 1424   ALT 25 07/09/2014 1424   BILITOT 0.4 07/09/2014 1424     Impression and Plan: Olivia Mosley is 59 year old female with iron deficiency. She is symptomatic with mild fatigue. We will see what her iron studies show.  He Hgb today is 14 and MCV is 88. We will go ahead and schedule her for a dose of Feraheme next week.  We will see her back in 6 months for labs ad follow-up.  She knows to contact us with any questions or concerns. We can certainly see her sooner if need be.   Eliezer Bottom, NP 12/8/201611:34 AM

## 2015-09-07 ENCOUNTER — Other Ambulatory Visit: Payer: Self-pay | Admitting: Family

## 2015-09-07 ENCOUNTER — Ambulatory Visit (HOSPITAL_BASED_OUTPATIENT_CLINIC_OR_DEPARTMENT_OTHER): Payer: Managed Care, Other (non HMO)

## 2015-09-07 VITALS — BP 119/68 | HR 73 | Temp 97.7°F | Resp 20

## 2015-09-07 DIAGNOSIS — D509 Iron deficiency anemia, unspecified: Secondary | ICD-10-CM | POA: Diagnosis not present

## 2015-09-07 MED ORDER — SODIUM CHLORIDE 0.9 % IV SOLN
510.0000 mg | Freq: Once | INTRAVENOUS | Status: AC
  Administered 2015-09-07: 510 mg via INTRAVENOUS
  Filled 2015-09-07: qty 17

## 2015-09-07 MED ORDER — SODIUM CHLORIDE 0.9 % IV SOLN
INTRAVENOUS | Status: DC
  Administered 2015-09-07: 16:00:00 via INTRAVENOUS

## 2015-09-07 NOTE — Patient Instructions (Signed)

## 2015-09-27 ENCOUNTER — Telehealth: Payer: Self-pay | Admitting: Internal Medicine

## 2015-09-27 NOTE — Telephone Encounter (Signed)
Patient has a CPE next week. Requests that you cvall in an antibiotic for sinus infection. She states that you have done this for her before.  Pharmacy is [facility] on [address]

## 2015-09-28 MED ORDER — AMOXICILLIN 875 MG PO TABS: 875.0000 mg | ORAL_TABLET | Freq: Two times a day (BID) | ORAL | Status: DC

## 2015-09-28 NOTE — Telephone Encounter (Signed)
done

## 2015-09-29 NOTE — Telephone Encounter (Signed)
Called left vm ..

## 2015-10-05 ENCOUNTER — Ambulatory Visit (INDEPENDENT_AMBULATORY_CARE_PROVIDER_SITE_OTHER): Payer: Managed Care, Other (non HMO) | Admitting: Internal Medicine

## 2015-10-05 ENCOUNTER — Encounter: Payer: Self-pay | Admitting: Internal Medicine

## 2015-10-05 VITALS — BP 100/70 | HR 91 | Temp 97.1°F | Ht 64.0 in | Wt 248.0 lb

## 2015-10-05 DIAGNOSIS — M21611 Bunion of right foot: Secondary | ICD-10-CM | POA: Diagnosis not present

## 2015-10-05 DIAGNOSIS — E038 Other specified hypothyroidism: Secondary | ICD-10-CM | POA: Diagnosis not present

## 2015-10-05 DIAGNOSIS — Z0000 Encounter for general adult medical examination without abnormal findings: Secondary | ICD-10-CM | POA: Diagnosis not present

## 2015-10-05 DIAGNOSIS — Z1231 Encounter for screening mammogram for malignant neoplasm of breast: Secondary | ICD-10-CM

## 2015-10-05 DIAGNOSIS — R739 Hyperglycemia, unspecified: Secondary | ICD-10-CM

## 2015-10-05 DIAGNOSIS — R238 Other skin changes: Secondary | ICD-10-CM

## 2015-10-05 DIAGNOSIS — G5603 Carpal tunnel syndrome, bilateral upper limbs: Secondary | ICD-10-CM

## 2015-10-05 DIAGNOSIS — R233 Spontaneous ecchymoses: Secondary | ICD-10-CM | POA: Insufficient documentation

## 2015-10-05 MED ORDER — AZELAIC ACID 15 % EX GEL: CUTANEOUS | Status: DC

## 2015-10-05 NOTE — Progress Notes (Signed)
Pre visit review using our clinic review tool, if applicable. No additional management support is needed unless otherwise documented below in the visit note. 

## 2015-10-05 NOTE — Patient Instructions (Signed)
Preventive Care for Adults, Female A healthy lifestyle and preventive care can promote health and wellness. Preventive health guidelines for women include the following key practices.  A routine yearly physical is a good way to check with your health care provider about your health and preventive screening. It is a chance to share any concerns and updates on your health and to receive a thorough exam.  Visit your dentist for a routine exam and preventive care every 6 months. Brush your teeth twice a day and floss once a day. Good oral hygiene prevents tooth decay and gum disease.  The frequency of eye exams is based on your age, health, family medical history, use of contact lenses, and other factors. Follow your health care provider's recommendations for frequency of eye exams.  Eat a healthy diet. Foods like vegetables, fruits, whole grains, low-fat dairy products, and lean protein foods contain the nutrients you need without too many calories. Decrease your intake of foods high in solid fats, added sugars, and salt. Eat the right amount of calories for you.Get information about a proper diet from your health care provider, if necessary.  Regular physical exercise is one of the most important things you can do for your health. Most adults should get at least 150 minutes of moderate-intensity exercise (any activity that increases your heart rate and causes you to sweat) each week. In addition, most adults need muscle-strengthening exercises on 2 or more days a week.  Maintain a healthy weight. The body mass index (BMI) is a screening tool to identify possible weight problems. It provides an estimate of body fat based on height and weight. Your health care provider can find your BMI and can help you achieve or maintain a healthy weight.For adults 20 years and older:  A BMI below 18.5 is considered underweight.  A BMI of 18.5 to 24.9 is normal.  A BMI of 25 to 29.9 is considered overweight.  A  BMI of 30 and above is considered obese.  Maintain normal blood lipids and cholesterol levels by exercising and minimizing your intake of saturated fat. Eat a balanced diet with plenty of fruit and vegetables. Blood tests for lipids and cholesterol should begin at age 45 and be repeated every 5 years. If your lipid or cholesterol levels are high, you are over 50, or you are at high risk for heart disease, you may need your cholesterol levels checked more frequently.Ongoing high lipid and cholesterol levels should be treated with medicines if diet and exercise are not working.  If you smoke, find out from your health care provider how to quit. If you do not use tobacco, do not start.  Lung cancer screening is recommended for adults aged 45-80 years who are at high risk for developing lung cancer because of a history of smoking. A yearly low-dose CT scan of the lungs is recommended for people who have at least a 30-pack-year history of smoking and are a current smoker or have quit within the past 15 years. A pack year of smoking is smoking an average of 1 pack of cigarettes a day for 1 year (for example: 1 pack a day for 30 years or 2 packs a day for 15 years). Yearly screening should continue until the smoker has stopped smoking for at least 15 years. Yearly screening should be stopped for people who develop a health problem that would prevent them from having lung cancer treatment.  If you are pregnant, do not drink alcohol. If you are  breastfeeding, be very cautious about drinking alcohol. If you are not pregnant and choose to drink alcohol, do not have more than 1 drink per day. One drink is considered to be 12 ounces (355 mL) of beer, 5 ounces (148 mL) of wine, or 1.5 ounces (44 mL) of liquor.  Avoid use of street drugs. Do not share needles with anyone. Ask for help if you need support or instructions about stopping the use of drugs.  High blood pressure causes heart disease and increases the risk  of stroke. Your blood pressure should be checked at least every 1 to 2 years. Ongoing high blood pressure should be treated with medicines if weight loss and exercise do not work.  If you are 55-79 years old, ask your health care provider if you should take aspirin to prevent strokes.  Diabetes screening is done by taking a blood sample to check your blood glucose level after you have not eaten for a certain period of time (fasting). If you are not overweight and you do not have risk factors for diabetes, you should be screened once every 3 years starting at age 45. If you are overweight or obese and you are 40-70 years of age, you should be screened for diabetes every year as part of your cardiovascular risk assessment.  Breast cancer screening is essential preventive care for women. You should practice "breast self-awareness." This means understanding the normal appearance and feel of your breasts and may include breast self-examination. Any changes detected, no matter how small, should be reported to a health care provider. Women in their 20s and 30s should have a clinical breast exam (CBE) by a health care provider as part of a regular health exam every 1 to 3 years. After age 40, women should have a CBE every year. Starting at age 40, women should consider having a mammogram (breast X-ray test) every year. Women who have a family history of breast cancer should talk to their health care provider about genetic screening. Women at a high risk of breast cancer should talk to their health care providers about having an MRI and a mammogram every year.  Breast cancer gene (BRCA)-related cancer risk assessment is recommended for women who have family members with BRCA-related cancers. BRCA-related cancers include breast, ovarian, tubal, and peritoneal cancers. Having family members with these cancers may be associated with an increased risk for harmful changes (mutations) in the breast cancer genes BRCA1 and  BRCA2. Results of the assessment will determine the need for genetic counseling and BRCA1 and BRCA2 testing.  Your health care provider may recommend that you be screened regularly for cancer of the pelvic organs (ovaries, uterus, and vagina). This screening involves a pelvic examination, including checking for microscopic changes to the surface of your cervix (Pap test). You may be encouraged to have this screening done every 3 years, beginning at age 21.  For women ages 30-65, health care providers may recommend pelvic exams and Pap testing every 3 years, or they may recommend the Pap and pelvic exam, combined with testing for human papilloma virus (HPV), every 5 years. Some types of HPV increase your risk of cervical cancer. Testing for HPV may also be done on women of any age with unclear Pap test results.  Other health care providers may not recommend any screening for nonpregnant women who are considered low risk for pelvic cancer and who do not have symptoms. Ask your health care provider if a screening pelvic exam is right for   you.  If you have had past treatment for cervical cancer or a condition that could lead to cancer, you need Pap tests and screening for cancer for at least 20 years after your treatment. If Pap tests have been discontinued, your risk factors (such as having a new sexual partner) need to be reassessed to determine if screening should resume. Some women have medical problems that increase the chance of getting cervical cancer. In these cases, your health care provider may recommend more frequent screening and Pap tests.  Colorectal cancer can be detected and often prevented. Most routine colorectal cancer screening begins at the age of 50 years and continues through age 75 years. However, your health care provider may recommend screening at an earlier age if you have risk factors for colon cancer. On a yearly basis, your health care provider may provide home test kits to check  for hidden blood in the stool. Use of a small camera at the end of a tube, to directly examine the colon (sigmoidoscopy or colonoscopy), can detect the earliest forms of colorectal cancer. Talk to your health care provider about this at age 50, when routine screening begins. Direct exam of the colon should be repeated every 5-10 years through age 75 years, unless early forms of precancerous polyps or small growths are found.  People who are at an increased risk for hepatitis B should be screened for this virus. You are considered at high risk for hepatitis B if:  You were born in a country where hepatitis B occurs often. Talk with your health care provider about which countries are considered high risk.  Your parents were born in a high-risk country and you have not received a shot to protect against hepatitis B (hepatitis B vaccine).  You have HIV or AIDS.  You use needles to inject street drugs.  You live with, or have sex with, someone who has hepatitis B.  You get hemodialysis treatment.  You take certain medicines for conditions like cancer, organ transplantation, and autoimmune conditions.  Hepatitis C blood testing is recommended for all people born from 1945 through 1965 and any individual with known risks for hepatitis C.  Practice safe sex. Use condoms and avoid high-risk sexual practices to reduce the spread of sexually transmitted infections (STIs). STIs include gonorrhea, chlamydia, syphilis, trichomonas, herpes, HPV, and human immunodeficiency virus (HIV). Herpes, HIV, and HPV are viral illnesses that have no cure. They can result in disability, cancer, and death.  You should be screened for sexually transmitted illnesses (STIs) including gonorrhea and chlamydia if:  You are sexually active and are younger than 24 years.  You are older than 24 years and your health care provider tells you that you are at risk for this type of infection.  Your sexual activity has changed  since you were last screened and you are at an increased risk for chlamydia or gonorrhea. Ask your health care provider if you are at risk.  If you are at risk of being infected with HIV, it is recommended that you take a prescription medicine daily to prevent HIV infection. This is called preexposure prophylaxis (PrEP). You are considered at risk if:  You are sexually active and do not regularly use condoms or know the HIV status of your partner(s).  You take drugs by injection.  You are sexually active with a partner who has HIV.  Talk with your health care provider about whether you are at high risk of being infected with HIV. If   you choose to begin PrEP, you should first be tested for HIV. You should then be tested every 3 months for as long as you are taking PrEP.  Osteoporosis is a disease in which the bones lose minerals and strength with aging. This can result in serious bone fractures or breaks. The risk of osteoporosis can be identified using a bone density scan. Women ages 67 years and over and women at risk for fractures or osteoporosis should discuss screening with their health care providers. Ask your health care provider whether you should take a calcium supplement or vitamin D to reduce the rate of osteoporosis.  Menopause can be associated with physical symptoms and risks. Hormone replacement therapy is available to decrease symptoms and risks. You should talk to your health care provider about whether hormone replacement therapy is right for you.  Use sunscreen. Apply sunscreen liberally and repeatedly throughout the day. You should seek shade when your shadow is shorter than you. Protect yourself by wearing long sleeves, pants, a wide-brimmed hat, and sunglasses year round, whenever you are outdoors.  Once a month, do a whole body skin exam, using a mirror to look at the skin on your back. Tell your health care provider of new moles, moles that have irregular borders, moles that  are larger than a pencil eraser, or moles that have changed in shape or color.  Stay current with required vaccines (immunizations).  Influenza vaccine. All adults should be immunized every year.  Tetanus, diphtheria, and acellular pertussis (Td, Tdap) vaccine. Pregnant women should receive 1 dose of Tdap vaccine during each pregnancy. The dose should be obtained regardless of the length of time since the last dose. Immunization is preferred during the 27th-36th week of gestation. An adult who has not previously received Tdap or who does not know her vaccine status should receive 1 dose of Tdap. This initial dose should be followed by tetanus and diphtheria toxoids (Td) booster doses every 10 years. Adults with an unknown or incomplete history of completing a 3-dose immunization series with Td-containing vaccines should begin or complete a primary immunization series including a Tdap dose. Adults should receive a Td booster every 10 years.  Varicella vaccine. An adult without evidence of immunity to varicella should receive 2 doses or a second dose if she has previously received 1 dose. Pregnant females who do not have evidence of immunity should receive the first dose after pregnancy. This first dose should be obtained before leaving the health care facility. The second dose should be obtained 4-8 weeks after the first dose.  Human papillomavirus (HPV) vaccine. Females aged 13-26 years who have not received the vaccine previously should obtain the 3-dose series. The vaccine is not recommended for use in pregnant females. However, pregnancy testing is not needed before receiving a dose. If a female is found to be pregnant after receiving a dose, no treatment is needed. In that case, the remaining doses should be delayed until after the pregnancy. Immunization is recommended for any person with an immunocompromised condition through the age of 61 years if she did not get any or all doses earlier. During the  3-dose series, the second dose should be obtained 4-8 weeks after the first dose. The third dose should be obtained 24 weeks after the first dose and 16 weeks after the second dose.  Zoster vaccine. One dose is recommended for adults aged 30 years or older unless certain conditions are present.  Measles, mumps, and rubella (MMR) vaccine. Adults born  before 1957 generally are considered immune to measles and mumps. Adults born in 1957 or later should have 1 or more doses of MMR vaccine unless there is a contraindication to the vaccine or there is laboratory evidence of immunity to each of the three diseases. A routine second dose of MMR vaccine should be obtained at least 28 days after the first dose for students attending postsecondary schools, health care workers, or international travelers. People who received inactivated measles vaccine or an unknown type of measles vaccine during 1963-1967 should receive 2 doses of MMR vaccine. People who received inactivated mumps vaccine or an unknown type of mumps vaccine before 1979 and are at high risk for mumps infection should consider immunization with 2 doses of MMR vaccine. For females of childbearing age, rubella immunity should be determined. If there is no evidence of immunity, females who are not pregnant should be vaccinated. If there is no evidence of immunity, females who are pregnant should delay immunization until after pregnancy. Unvaccinated health care workers born before 1957 who lack laboratory evidence of measles, mumps, or rubella immunity or laboratory confirmation of disease should consider measles and mumps immunization with 2 doses of MMR vaccine or rubella immunization with 1 dose of MMR vaccine.  Pneumococcal 13-valent conjugate (PCV13) vaccine. When indicated, a person who is uncertain of his immunization history and has no record of immunization should receive the PCV13 vaccine. All adults 65 years of age and older should receive this  vaccine. An adult aged 19 years or older who has certain medical conditions and has not been previously immunized should receive 1 dose of PCV13 vaccine. This PCV13 should be followed with a dose of pneumococcal polysaccharide (PPSV23) vaccine. Adults who are at high risk for pneumococcal disease should obtain the PPSV23 vaccine at least 8 weeks after the dose of PCV13 vaccine. Adults older than 60 years of age who have normal immune system function should obtain the PPSV23 vaccine dose at least 1 year after the dose of PCV13 vaccine.  Pneumococcal polysaccharide (PPSV23) vaccine. When PCV13 is also indicated, PCV13 should be obtained first. All adults aged 65 years and older should be immunized. An adult younger than age 65 years who has certain medical conditions should be immunized. Any person who resides in a nursing home or long-term care facility should be immunized. An adult smoker should be immunized. People with an immunocompromised condition and certain other conditions should receive both PCV13 and PPSV23 vaccines. People with human immunodeficiency virus (HIV) infection should be immunized as soon as possible after diagnosis. Immunization during chemotherapy or radiation therapy should be avoided. Routine use of PPSV23 vaccine is not recommended for American Indians, [place] Natives, or people younger than 65 years unless there are medical conditions that require PPSV23 vaccine. When indicated, people who have unknown immunization and have no record of immunization should receive PPSV23 vaccine. One-time revaccination 5 years after the first dose of PPSV23 is recommended for people aged 19-64 years who have chronic kidney failure, nephrotic syndrome, asplenia, or immunocompromised conditions. People who received 1-2 doses of PPSV23 before age 65 years should receive another dose of PPSV23 vaccine at age 65 years or later if at least 5 years have passed since the previous dose. Doses of PPSV23 are not  needed for people immunized with PPSV23 at or after age 65 years.  Meningococcal vaccine. Adults with asplenia or persistent complement component deficiencies should receive 2 doses of quadrivalent meningococcal conjugate (MenACWY-D) vaccine. The doses should be obtained   at least 2 months apart. Microbiologists working with certain meningococcal bacteria, [place] recruits, people at risk during an outbreak, and people who travel to or live in countries with a high rate of meningitis should be immunized. A first-year college student up through age 34 years who is living in a residence hall should receive a dose if she did not receive a dose on or after her 16th birthday. Adults who have certain high-risk conditions should receive one or more doses of vaccine.  Hepatitis A vaccine. Adults who wish to be protected from this disease, have certain high-risk conditions, work with hepatitis A-infected animals, work in hepatitis A research labs, or travel to or work in countries with a high rate of hepatitis A should be immunized. Adults who were previously unvaccinated and who anticipate close contact with an international adoptee during the first 60 days after arrival in the [place] from a country with a high rate of hepatitis A should be immunized.  Hepatitis B vaccine. Adults who wish to be protected from this disease, have certain high-risk conditions, may be exposed to blood or other infectious body fluids, are household contacts or sex partners of hepatitis B positive people, are clients or workers in certain care facilities, or travel to or work in countries with a high rate of hepatitis B should be immunized.  Haemophilus influenzae type b (Hib) vaccine. A previously unvaccinated person with asplenia or sickle cell disease or having a scheduled splenectomy should receive 1 dose of Hib vaccine. Regardless of previous immunization, a recipient of a hematopoietic stem cell transplant should receive a  3-dose series 6-12 months after her successful transplant. Hib vaccine is not recommended for adults with HIV infection. Preventive Services / Frequency Ages 35 to 4 years  Blood pressure check.** / Every 3-5 years.  Lipid and cholesterol check.** / Every 5 years beginning at age 60.  Clinical breast exam.** / Every 3 years for women in their 71s and 10s.  BRCA-related cancer risk assessment.** / For women who have family members with a BRCA-related cancer (breast, ovarian, tubal, or peritoneal cancers).  Pap test.** / Every 2 years from ages 76 through 26. Every 3 years starting at age 61 through age 76 or 93 with a history of 3 consecutive normal Pap tests.  HPV screening.** / Every 3 years from ages 37 through ages 60 to 51 with a history of 3 consecutive normal Pap tests.  Hepatitis C blood test.** / For any individual with known risks for hepatitis C.  Skin self-exam. / Monthly.  Influenza vaccine. / Every year.  Tetanus, diphtheria, and acellular pertussis (Tdap, Td) vaccine.** / Consult your health care provider. Pregnant women should receive 1 dose of Tdap vaccine during each pregnancy. 1 dose of Td every 10 years.  Varicella vaccine.** / Consult your health care provider. Pregnant females who do not have evidence of immunity should receive the first dose after pregnancy.  HPV vaccine. / 3 doses over 6 months, if 93 and younger. The vaccine is not recommended for use in pregnant females. However, pregnancy testing is not needed before receiving a dose.  Measles, mumps, rubella (MMR) vaccine.** / You need at least 1 dose of MMR if you were born in 1957 or later. You may also need a 2nd dose. For females of childbearing age, rubella immunity should be determined. If there is no evidence of immunity, females who are not pregnant should be vaccinated. If there is no evidence of immunity, females who are  pregnant should delay immunization until after pregnancy.  Pneumococcal  13-valent conjugate (PCV13) vaccine.** / Consult your health care provider.  Pneumococcal polysaccharide (PPSV23) vaccine.** / 1 to 2 doses if you smoke cigarettes or if you have certain conditions.  Meningococcal vaccine.** / 1 dose if you are age 68 to 8 years and a Market researcher living in a residence hall, or have one of several medical conditions, you need to get vaccinated against meningococcal disease. You may also need additional booster doses.  Hepatitis A vaccine.** / Consult your health care provider.  Hepatitis B vaccine.** / Consult your health care provider.  Haemophilus influenzae type b (Hib) vaccine.** / Consult your health care provider. Ages 7 to 53 years  Blood pressure check.** / Every year.  Lipid and cholesterol check.** / Every 5 years beginning at age 25 years.  Lung cancer screening. / Every year if you are aged 11-80 years and have a 30-pack-year history of smoking and currently smoke or have quit within the past 15 years. Yearly screening is stopped once you have quit smoking for at least 15 years or develop a health problem that would prevent you from having lung cancer treatment.  Clinical breast exam.** / Every year after age 48 years.  BRCA-related cancer risk assessment.** / For women who have family members with a BRCA-related cancer (breast, ovarian, tubal, or peritoneal cancers).  Mammogram.** / Every year beginning at age 41 years and continuing for as long as you are in good health. Consult with your health care provider.  Pap test.** / Every 3 years starting at age 65 years through age 37 or 70 years with a history of 3 consecutive normal Pap tests.  HPV screening.** / Every 3 years from ages 72 years through ages 60 to 40 years with a history of 3 consecutive normal Pap tests.  Fecal occult blood test (FOBT) of stool. / Every year beginning at age 21 years and continuing until age 5 years. You may not need to do this test if you get  a colonoscopy every 10 years.  Flexible sigmoidoscopy or colonoscopy.** / Every 5 years for a flexible sigmoidoscopy or every 10 years for a colonoscopy beginning at age 35 years and continuing until age 48 years.  Hepatitis C blood test.** / For all people born from [birth year] through 1965 and any individual with known risks for hepatitis C.  Skin self-exam. / Monthly.  Influenza vaccine. / Every year.  Tetanus, diphtheria, and acellular pertussis (Tdap/Td) vaccine.** / Consult your health care provider. Pregnant women should receive 1 dose of Tdap vaccine during each pregnancy. 1 dose of Td every 10 years.  Varicella vaccine.** / Consult your health care provider. Pregnant females who do not have evidence of immunity should receive the first dose after pregnancy.  Zoster vaccine.** / 1 dose for adults aged 30 years or older.  Measles, mumps, rubella (MMR) vaccine.** / You need at least 1 dose of MMR if you were born in 1957 or later. You may also need a second dose. For females of childbearing age, rubella immunity should be determined. If there is no evidence of immunity, females who are not pregnant should be vaccinated. If there is no evidence of immunity, females who are pregnant should delay immunization until after pregnancy.  Pneumococcal 13-valent conjugate (PCV13) vaccine.** / Consult your health care provider.  Pneumococcal polysaccharide (PPSV23) vaccine.** / 1 to 2 doses if you smoke cigarettes or if you have certain conditions.  Meningococcal vaccine.** /  Consult your health care provider.  Hepatitis A vaccine.** / Consult your health care provider.  Hepatitis B vaccine.** / Consult your health care provider.  Haemophilus influenzae type b (Hib) vaccine.** / Consult your health care provider. Ages 64 years and over  Blood pressure check.** / Every year.  Lipid and cholesterol check.** / Every 5 years beginning at age 23 years.  Lung cancer screening. / Every year if you  are aged 16-80 years and have a 30-pack-year history of smoking and currently smoke or have quit within the past 15 years. Yearly screening is stopped once you have quit smoking for at least 15 years or develop a health problem that would prevent you from having lung cancer treatment.  Clinical breast exam.** / Every year after age 74 years.  BRCA-related cancer risk assessment.** / For women who have family members with a BRCA-related cancer (breast, ovarian, tubal, or peritoneal cancers).  Mammogram.** / Every year beginning at age 44 years and continuing for as long as you are in good health. Consult with your health care provider.  Pap test.** / Every 3 years starting at age 58 years through age 22 or 39 years with 3 consecutive normal Pap tests. Testing can be stopped between 65 and 70 years with 3 consecutive normal Pap tests and no abnormal Pap or HPV tests in the past 10 years.  HPV screening.** / Every 3 years from ages 64 years through ages 70 or 61 years with a history of 3 consecutive normal Pap tests. Testing can be stopped between 65 and 70 years with 3 consecutive normal Pap tests and no abnormal Pap or HPV tests in the past 10 years.  Fecal occult blood test (FOBT) of stool. / Every year beginning at age 40 years and continuing until age 27 years. You may not need to do this test if you get a colonoscopy every 10 years.  Flexible sigmoidoscopy or colonoscopy.** / Every 5 years for a flexible sigmoidoscopy or every 10 years for a colonoscopy beginning at age 7 years and continuing until age 32 years.  Hepatitis C blood test.** / For all people born from [birth year] through 1965 and any individual with known risks for hepatitis C.  Osteoporosis screening.** / A one-time screening for women ages 30 years and over and women at risk for fractures or osteoporosis.  Skin self-exam. / Monthly.  Influenza vaccine. / Every year.  Tetanus, diphtheria, and acellular pertussis (Tdap/Td)  vaccine.** / 1 dose of Td every 10 years.  Varicella vaccine.** / Consult your health care provider.  Zoster vaccine.** / 1 dose for adults aged 35 years or older.  Pneumococcal 13-valent conjugate (PCV13) vaccine.** / Consult your health care provider.  Pneumococcal polysaccharide (PPSV23) vaccine.** / 1 dose for all adults aged 46 years and older.  Meningococcal vaccine.** / Consult your health care provider.  Hepatitis A vaccine.** / Consult your health care provider.  Hepatitis B vaccine.** / Consult your health care provider.  Haemophilus influenzae type b (Hib) vaccine.** / Consult your health care provider. ** Family history and personal history of risk and conditions may change your health care provider's recommendations.   This information is not intended to replace advice given to you by your health care provider. Make sure you discuss any questions you have with your health care provider.   Document Released: 11/06/2001 Document Revised: 10/01/2014 Document Reviewed: 02/05/2011 Elsevier Interactive Patient Education [employer].

## 2015-10-05 NOTE — Progress Notes (Signed)
Subjective:  Patient ID: Olivia Mosley, female    DOB: December 15, 1955  Age: 60 y.o. MRN: [id number]  CC: Annual Exam   HPI Olivia Mosley presents for a complete physical but she has complaints. She complains of a painful bunion in her right foot and wants to see a podiatrist. She has a couple small bruises on her forearm that she is concerned about. She does not report any bleeding from anywhere else. She doesn't recall any trauma or injury. She complains of persistent discomfort in her wrist and hands associated with carpal tunnel syndrome. She wants to see a therapist about this.  Outpatient Prescriptions Prior to Visit  Medication Sig Dispense Refill  . ALPRAZolam (XANAX) 0.5 MG tablet Take 1 tablet (0.5 mg total) by mouth 2 (two) times daily as needed for anxiety. 60 tablet 3  . amoxicillin (AMOXIL) 875 MG tablet Take 1 tablet (875 mg total) by mouth 2 (two) times daily. 20 tablet 0  . atorvastatin (LIPITOR) 40 MG tablet TAKE 1 TABLET BY MOUTH EVERY DAY (NEED TO SEE DR BEFORE MORE REFILLS) 90 tablet 2  . azelastine (ASTELIN) 0.1 % nasal spray Place 2 sprays into both nostrils 2 (two) times daily. Use in each nostril as directed 30 mL 11  . Cholecalciferol 2000 UNITS TABS Take 1 tablet (2,000 Units total) by mouth daily. 90 tablet 3  . clobetasol cream (TEMOVATE) 0.05 %   0  . escitalopram (LEXAPRO) 10 MG tablet TAKE 1 TABLET BY MOUTH DAILY 90 tablet 2  . esomeprazole (NEXIUM) 40 MG capsule TAKE 1 CAPSULE BY MOUTH DAILY (NEED TO SEE DR BEFORE MORE REFILLS) 90 capsule 3  . fexofenadine (ALLEGRA) 180 MG tablet TAKE 1 TABLET BY MOUTH DAILY (NEED TO SEE DR BEFORE MORE REFILLS) 90 tablet 3  . levothyroxine (SYNTHROID, LEVOTHROID) 75 MCG tablet TAKE 1 TABLET BY MOUTH EVERY DAY 90 tablet 0  . meloxicam (MOBIC) 15 MG tablet     . traZODone (DESYREL) 50 MG tablet TAKE 2 TABLETS BY MOUTH AT BEDTIME AS NEEDED FOR SLEEP 180 tablet 2  . tretinoin (RETIN-A) 0.025 % cream APPLY TOPICALLY AT BEDTIME (NEED TO  SEE DR BEFORE MORE REFILLS) 45 g 0  . triamcinolone cream (KENALOG) 0.5 % Apply 1 application topically 3 (three) times daily. 30 g 1  . acetaminophen-codeine (TYLENOL #3) 300-30 MG per tablet   0  . FINACEA 15 % cream APPLY 1/2 GRAM TWICE DAILY 50 g 0  . fluticasone (FLONASE) 50 MCG/ACT nasal spray Place 1 spray into both nostrils daily. 16 g 6   No facility-administered medications prior to visit.    ROS Review of Systems  Constitutional: Negative.  Negative for fever, chills, diaphoresis, appetite change and fatigue.  HENT: Negative.  Negative for facial swelling, nosebleeds, postnasal drip, sinus pressure, sore throat and trouble swallowing.   Eyes: Negative.  Negative for visual disturbance.  Respiratory: Negative.  Negative for cough, choking, chest tightness, shortness of breath and stridor.   Cardiovascular: Negative.  Negative for chest pain, palpitations and leg swelling.  Gastrointestinal: Negative.  Negative for nausea, vomiting, abdominal pain, diarrhea, constipation and anal bleeding.  Endocrine: Negative.   Genitourinary: Negative.  Negative for urgency, flank pain, decreased urine volume and difficulty urinating.  Musculoskeletal: Positive for arthralgias. Negative for myalgias, back pain, gait problem and neck pain.  Skin: Negative.  Negative for color change and rash.  Allergic/Immunologic: Negative.   Neurological: Positive for dizziness. Negative for tremors, weakness, numbness and headaches.  Hematological: Bruises/bleeds easily.  Psychiatric/Behavioral: Negative.  Negative for confusion, sleep disturbance and decreased concentration. The patient is not nervous/anxious.     Objective:  BP 100/70 mmHg  Pulse 91  Temp(Src) 97.1 F (36.2 C) (Oral)  Ht 5\' 4"  (1.626 m)  Wt 248 lb (112.492 kg)  BMI 42.55 kg/m2  SpO2 97%  BP Readings from Last 3 Encounters:  10/05/15 100/70  09/07/15 119/68  09/01/15 138/71    Wt Readings from Last 3 Encounters:  10/05/15 248  lb (112.492 kg)  09/01/15 244 lb (110.678 kg)  04/20/15 244 lb 12 oz (111.018 kg)    Physical Exam  Constitutional: She is oriented to person, place, and time. She appears well-developed and well-nourished. No distress.  HENT:  Head: Normocephalic and atraumatic.  Mouth/Throat: Oropharynx is clear and moist. No oropharyngeal exudate.  Eyes: Conjunctivae are normal. Right eye exhibits no discharge. Left eye exhibits no discharge. No scleral icterus.  Neck: Normal range of motion. Neck supple. No JVD present. No tracheal deviation present. No thyromegaly present.  Cardiovascular: Normal rate, regular rhythm, normal heart sounds and intact distal pulses.  Exam reveals no gallop and no friction rub.   No murmur heard. Pulmonary/Chest: Effort normal and breath sounds normal. No stridor. No respiratory distress. She has no wheezes. She has no rales. She exhibits no tenderness.  Abdominal: Soft. Bowel sounds are normal. She exhibits no distension and no mass. There is no tenderness. There is no rebound and no guarding.  Musculoskeletal: Normal range of motion. She exhibits no edema or tenderness.  Lymphadenopathy:    She has no cervical adenopathy.  Neurological: She is oriented to person, place, and time.  Skin: Skin is warm and dry. No rash noted. She is not diaphoretic. No erythema. No pallor.  There are several small purpuric lesions over the dorsum of both forearms. These all measure less than 1 cm. There are 2 on the left and 3 on the right.  Psychiatric: She has a normal mood and affect. Her behavior is normal. Judgment and thought content normal.  Vitals reviewed.   Lab Results  Component Value Date   WBC 8.0 09/01/2015   HGB 14.0 09/01/2015   HCT 42.1 09/01/2015   PLT 360 09/01/2015   GLUCOSE 143* 09/01/2015   CHOL 164 05/05/2015   TRIG 123.0 05/05/2015   HDL 51.10 05/05/2015   LDLCALC 89 05/05/2015   ALT 15 09/01/2015   AST 13 09/01/2015   NA 138 09/01/2015   K 4.4  09/01/2015   CL 107 05/05/2015   CREATININE 0.9 09/01/2015   BUN 29.4* 09/01/2015   CO2 18* 09/01/2015   TSH 1.55 05/05/2015   HGBA1C 5.9 05/05/2015    No results found.  Assessment & Plan:   Olivia Mosley was seen today for annual exam.  Diagnoses and all orders for this visit:  Other specified hypothyroidism- I will recheck her TSH and adjust her Synthroid as indicated.  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 There are no other signs of coagulopathy. -     CBC with Differential/Platelet; Future -     Protime-INR; Future -     APTT; Future  Bunion of great toe of right  foot -     Ambulatory referral to Podiatry  Bilateral carpal tunnel syndrome -     Ambulatory referral to Occupational Therapy  Routine general medical examination at a health care facility- her vaccines were reviewed and updated, she was referred for mammogram, labs ordered, exam done, she was given patient education material, her colonoscopy is up-to-date. -     Lipid panel; Future -     TSH; Future -     Comprehensive metabolic panel; Future -     CBC with Differential/Platelet; Future -     Hepatitis C antibody; Future  Visit for screening mammogram -     MM DIGITAL SCREENING BILATERAL; Future  Other orders -     Azelaic Acid (FINACEA) 15 % cream; APPLY 1/2 GRAM TWICE DAILY  I have discontinued Olivia Mosley fluticasone and acetaminophen-codeine. I have also changed her FINACEA to Azelaic Acid. Additionally, I am having her maintain her meloxicam, tretinoin, fexofenadine, esomeprazole, Cholecalciferol, triamcinolone cream, clobetasol cream, azelastine, ALPRAZolam, atorvastatin, escitalopram, traZODone, levothyroxine, and  amoxicillin.  Meds ordered this encounter  Medications  . Azelaic Acid (FINACEA) 15 % cream    Sig: APPLY 1/2 GRAM TWICE DAILY    Dispense:  50 g    Refill:  11     Follow-up: Return in about 6 months (around 04/03/2016).  Scarlette Calico, MD

## 2015-10-19 ENCOUNTER — Encounter: Payer: Self-pay | Admitting: Podiatry

## 2015-10-19 ENCOUNTER — Ambulatory Visit (INDEPENDENT_AMBULATORY_CARE_PROVIDER_SITE_OTHER): Payer: Managed Care, Other (non HMO) | Admitting: Podiatry

## 2015-10-19 ENCOUNTER — Ambulatory Visit (INDEPENDENT_AMBULATORY_CARE_PROVIDER_SITE_OTHER): Payer: Managed Care, Other (non HMO)

## 2015-10-19 ENCOUNTER — Other Ambulatory Visit (INDEPENDENT_AMBULATORY_CARE_PROVIDER_SITE_OTHER): Payer: Managed Care, Other (non HMO)

## 2015-10-19 VITALS — Resp 16 | Ht 63.5 in | Wt 250.0 lb

## 2015-10-19 DIAGNOSIS — M21619 Bunion of unspecified foot: Secondary | ICD-10-CM

## 2015-10-19 DIAGNOSIS — R739 Hyperglycemia, unspecified: Secondary | ICD-10-CM

## 2015-10-19 DIAGNOSIS — R238 Other skin changes: Secondary | ICD-10-CM | POA: Diagnosis not present

## 2015-10-19 DIAGNOSIS — M205X1 Other deformities of toe(s) (acquired), right foot: Secondary | ICD-10-CM

## 2015-10-19 DIAGNOSIS — Z0000 Encounter for general adult medical examination without abnormal findings: Secondary | ICD-10-CM

## 2015-10-19 DIAGNOSIS — R233 Spontaneous ecchymoses: Secondary | ICD-10-CM

## 2015-10-19 LAB — LIPID PANEL
Cholesterol: 130 mg/dL (ref 0–200)
HDL: 53.4 mg/dL (ref >39.00)
LDL Cholesterol: 45 mg/dL (ref 0–99)
NonHDL: 76.95
Total CHOL/HDL Ratio: 2
Triglycerides: 158 mg/dL — ABNORMAL HIGH (ref 0.0–149.0)
VLDL: 31.6 mg/dL (ref 0.0–40.0)

## 2015-10-19 LAB — COMPREHENSIVE METABOLIC PANEL
ALT: 27 U/L (ref 0–35)
AST: 19 U/L (ref 0–37)
Albumin: 4.2 g/dL (ref 3.5–5.2)
Alkaline Phosphatase: 75 U/L (ref 39–117)
BUN: 35 mg/dL — ABNORMAL HIGH (ref 6–23)
CO2: 25 mEq/L (ref 19–32)
Calcium: 9.2 mg/dL (ref 8.4–10.5)
Chloride: 104 mEq/L (ref 96–112)
Creatinine, Ser: 0.88 mg/dL (ref 0.40–1.20)
GFR: 69.79 mL/min (ref >60.00)
Glucose, Bld: 87 mg/dL (ref 70–99)
Potassium: 4.5 mEq/L (ref 3.5–5.1)
Sodium: 136 mEq/L (ref 135–145)
Total Bilirubin: 0.2 mg/dL (ref 0.2–1.2)
Total Protein: 6.9 g/dL (ref 6.0–8.3)

## 2015-10-19 LAB — CBC WITH DIFFERENTIAL/PLATELET
Basophils Absolute: 0.1 10*3/uL (ref 0.0–0.1)
Basophils Relative: 0.5 % (ref 0.0–3.0)
Eosinophils Absolute: 0.1 10*3/uL (ref 0.0–0.7)
Eosinophils Relative: 1.3 % (ref 0.0–5.0)
HCT: 41.8 % (ref 36.0–46.0)
Hemoglobin: 13.8 g/dL (ref 12.0–15.0)
Lymphocytes Relative: 17.4 % (ref 12.0–46.0)
Lymphs Abs: 1.8 10*3/uL (ref 0.7–4.0)
MCHC: 32.9 g/dL (ref 30.0–36.0)
MCV: 88.8 fl (ref 78.0–100.0)
Monocytes Absolute: 0.9 10*3/uL (ref 0.1–1.0)
Monocytes Relative: 8.6 % (ref 3.0–12.0)
Neutro Abs: 7.4 10*3/uL (ref 1.4–7.7)
Neutrophils Relative %: 72.2 % (ref 43.0–77.0)
Platelets: 316 10*3/uL (ref 150.0–400.0)
RBC: 4.72 Mil/uL (ref 3.87–5.11)
RDW: 16 % — ABNORMAL HIGH (ref 11.5–15.5)
WBC: 10.2 10*3/uL (ref 4.0–10.5)

## 2015-10-19 LAB — TSH: TSH: 1.65 u[IU]/mL (ref 0.35–4.50)

## 2015-10-19 LAB — HEMOGLOBIN A1C: Hgb A1c MFr Bld: 5.8 % (ref 4.6–6.5)

## 2015-10-19 LAB — PROTIME-INR
INR: 1 ratio (ref 0.8–1.0)
Prothrombin Time: 10.4 s (ref 9.6–13.1)

## 2015-10-19 LAB — APTT: aPTT: 30.2 s (ref 23.4–32.7)

## 2015-10-19 NOTE — Progress Notes (Signed)
Subjective:     Patient ID: Olivia Mosley, female   DOB: 23-Jan-1956, 60 y.o.   MRN: [id number]  HPI patient presents with painful bunion deformity right over left stating that this is been present for several years and worse over the last 6 months. States that she's tried wider shoes she's tried padding oral anti-inflammatory soaks without relief of symptoms and it's gradually making it more difficult for her to be active or wear shoe gear comfortably   Review of Systems  All other systems reviewed and are negative.      Objective:   Physical Exam  Constitutional: She is oriented to person, place, and time.  Cardiovascular: Intact distal pulses.   Musculoskeletal: Normal range of motion.  Neurological: She is oriented to person, place, and time.  Skin: Skin is warm.  Nursing note and vitals reviewed.  neurovascular status found to be intact with muscle strength adequate range of motion within normal limits. Patient's noted to have large dorsal and medial spur first metatarsal head right over left with redness and pain when palpated with diminished range of motion noted of the joint and discomfort with palpation. 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 At this time due to long-standing nature and failure to respond to numerous conservative treatments and referral from family physician I've recommended surgical intervention for the right foot to consist of osteotomy with removal of dorsal medial spur formation. Patient wants procedure and scheduled in several weeks and will reappoint for consult in 1 week   X-ray report indicates that there is large spurring of the dorsal medial aspect first metatarsal head right over left with narrowing of the joint surface and elevation of the 1-2 metatarsal  angle right of approximate 15 with deviation of the hallux.

## 2015-10-19 NOTE — Progress Notes (Signed)
   Subjective:    Patient ID: Olivia Mosley, female    DOB: 07/27/1956, 60 y.o.   MRN: [id number]  HPI Patient presents with bilateral foot pain; bunions. Pt stated, "wants to discuss options on how to help foot pain; Right foot hurts more than left".   Review of Systems  All other systems reviewed and are negative.      Objective:   Physical Exam        Assessment & Plan:

## 2015-10-20 ENCOUNTER — Telehealth: Payer: Self-pay | Admitting: *Deleted

## 2015-10-20 LAB — HEPATITIS C ANTIBODY: HCV Ab: NEGATIVE

## 2015-10-20 NOTE — Telephone Encounter (Signed)
"  I was just there yesterday and was diagnosed.  I'm having surgery on 02/07.  I need to reschedule the follow-up and the surgery, push it back a week.  So follow-up on February 14 and then the next week would be good for surgery.  Please call, thank you."

## 2015-10-21 ENCOUNTER — Encounter: Payer: Self-pay | Admitting: Internal Medicine

## 2015-10-24 ENCOUNTER — Encounter: Payer: Self-pay | Admitting: Internal Medicine

## 2015-10-25 NOTE — Telephone Encounter (Signed)
I called and left patient a message that on October 21, 2015 we decided we were going to leave consultation appointment for 10/26/2015 and move surgery to 11/15/2015.  Please call to verify.

## 2015-10-25 NOTE — Telephone Encounter (Signed)
"  I got your message I would like to know if I can change the follow-up before my surgery.  I need to take my mother somewhere."  He can see you on Thursday, February 9th at 1:45pm.  "Okay, that date and time is fine.  Also, my husband said he has been receiving calls from you all.  Can you take his number off as well as the old work number?"  I'll make those changes.

## 2015-10-26 ENCOUNTER — Ambulatory Visit: Payer: Managed Care, Other (non HMO) | Admitting: Podiatry

## 2015-10-26 ENCOUNTER — Other Ambulatory Visit: Payer: Self-pay | Admitting: Internal Medicine

## 2015-10-28 ENCOUNTER — Other Ambulatory Visit: Payer: Self-pay | Admitting: Internal Medicine

## 2015-10-28 ENCOUNTER — Encounter: Payer: Self-pay | Admitting: Internal Medicine

## 2015-10-31 ENCOUNTER — Other Ambulatory Visit: Payer: Self-pay | Admitting: Internal Medicine

## 2015-10-31 DIAGNOSIS — Z1231 Encounter for screening mammogram for malignant neoplasm of breast: Secondary | ICD-10-CM | POA: Insufficient documentation

## 2015-10-31 MED ORDER — TRETINOIN 0.025 % EX CREA: TOPICAL_CREAM | Freq: Every day | CUTANEOUS | Status: DC

## 2015-10-31 NOTE — Addendum Note (Signed)
Addended by: Janith Lima on: 10/31/2015 10:49 AM   Modules accepted: Orders

## 2015-11-02 ENCOUNTER — Telehealth: Payer: Self-pay | Admitting: Internal Medicine

## 2015-11-02 NOTE — Telephone Encounter (Signed)
Please advise 

## 2015-11-02 NOTE — Telephone Encounter (Signed)
Patient states she has moved to [facility] and needs to transfer her care. She states she hates to leave Dr. Ronnald Ramp, but she is taking care of her mother in law and it is hard for her to get to [place]. She is wondering if Dr. Ronnald Ramp has any recommendations for a PCP in [place]. Please advise pt.

## 2015-11-02 NOTE — Telephone Encounter (Signed)
Dr Jenna Luo is very good

## 2015-11-03 ENCOUNTER — Ambulatory Visit (INDEPENDENT_AMBULATORY_CARE_PROVIDER_SITE_OTHER): Payer: Managed Care, Other (non HMO) | Admitting: Podiatry

## 2015-11-03 ENCOUNTER — Encounter: Payer: Self-pay | Admitting: Podiatry

## 2015-11-03 VITALS — BP 142/86 | HR 69 | Resp 12

## 2015-11-03 DIAGNOSIS — M205X1 Other deformities of toe(s) (acquired), right foot: Secondary | ICD-10-CM

## 2015-11-03 DIAGNOSIS — M21619 Bunion of unspecified foot: Secondary | ICD-10-CM | POA: Diagnosis not present

## 2015-11-03 NOTE — Progress Notes (Signed)
Subjective:     Patient ID: Olivia Mosley, female   DOB: [birth year], 60 y.o.   MRN: [id number]  HPI patient presents with pain in the big toe joint right and states that she has trouble walking or wearing shoe gear   Review of Systems     Objective:   Physical Exam Neurovascular status intact with hyperostosis dorsal medial aspect right first metatarsal head limitation of motion and pain when the joint is pressed    Assessment:     Inflammatory hallux limitus condition right    Plan:     Reviewed condition at great length and at this time allow patient to review consent form going over alternative treatments and complications associated with this procedure. Patient wants surgery understanding all risk associated with any type of surgery and signs consent form after extensive review and is scheduled for outpatient surgery [facility] specialty surgical center. Patient understands total recovery. Can take 6 months to one year and I did dispense air fracture walker today with all instructions on usage

## 2015-11-03 NOTE — Patient Instructions (Signed)
Pre-Operative Instructions  Congratulations, you have decided to take an important step to improving your quality of life.  You can be assured that the doctors of [facility] will be with you every step of the way.  1. Plan to be at the surgery center/hospital at least 1 (one) hour prior to your scheduled time unless otherwise directed by the surgical center/hospital staff.  You must have a responsible adult accompany you, remain during the surgery and drive you home.  Make sure you have directions to the surgical center/hospital and know how to get there on time. 2. For hospital based surgery you will need to obtain a history and physical form from your family physician within 1 month prior to the date of surgery- we will give you a form for you primary physician.  3. We make every effort to accommodate the date you request for surgery.  There are however, times where surgery dates or times have to be moved.  We will contact you as soon as possible if a change in schedule is required.   4. No Aspirin/Ibuprofen for one week before surgery.  If you are on aspirin, any non-steroidal anti-inflammatory medications (Mobic, Aleve, Ibuprofen) you should stop taking it 7 days prior to your surgery.  You make take Tylenol  For pain prior to surgery.  5. Medications- If you are taking daily heart and blood pressure medications, seizure, reflux, allergy, asthma, anxiety, pain or diabetes medications, make sure the surgery center/hospital is aware before the day of surgery so they may notify you which medications to take or avoid the day of surgery. 6. No food or drink after midnight the night before surgery unless directed otherwise by surgical center/hospital staff. 7. No alcoholic beverages 24 hours prior to surgery.  No smoking 24 hours prior to or 24 hours after surgery. 8. Wear loose pants or shorts- loose enough to fit over bandages, boots, and casts. 9. No slip on shoes, sneakers are best. 10. Bring  your boot with you to the surgery center/hospital.  Also bring crutches or a walker if your physician has prescribed it for you.  If you do not have this equipment, it will be provided for you after surgery. 11. If you have not been contracted by the surgery center/hospital by the day before your surgery, call to confirm the date and time of your surgery. 12. Leave-time from work may vary depending on the type of surgery you have.  Appropriate arrangements should be made prior to surgery with your employer. 13. Prescriptions will be provided immediately following surgery by your doctor.  Have these filled as soon as possible after surgery and take the medication as directed. 14. Remove nail polish on the operative foot. 15. Wash the night before surgery.  The night before surgery wash the foot and leg well with the antibacterial soap provided and water paying special attention to beneath the toenails and in between the toes.  Rinse thoroughly with water and dry well with a towel.  Perform this wash unless told not to do so by your physician.  Enclosed: 1 Ice pack (please put in freezer the night before surgery)   1 Hibiclens skin cleaner   Pre-op Instructions  If you have any questions regarding the instructions, do not hesitate to call our office.  [place]: [address] [place], [place] [address] [phone]  [place]: [address] [place], [place] [address] [phone]  [place]: [address]  [place], [place] [address] [phone]  Dr. Richard   Tuchman DPM, Dr. Norman Regal DPM Dr. Richard Sikora DPM, Dr. M. Todd Hyatt DPM, Dr. Kathryn Egerton DPM 

## 2015-11-04 NOTE — Telephone Encounter (Signed)
Pt informed

## 2015-11-07 ENCOUNTER — Telehealth: Payer: Self-pay | Admitting: Internal Medicine

## 2015-11-07 NOTE — Telephone Encounter (Signed)
Pt called back to check on the mammogram order. Advise by our [facility] that we need to change the order for the Mammogram from MM Digital Screen to Diagnostic Test. Please help, pt is really need to get this look at.

## 2015-11-08 ENCOUNTER — Other Ambulatory Visit: Payer: Self-pay | Admitting: Internal Medicine

## 2015-11-08 DIAGNOSIS — N644 Mastodynia: Secondary | ICD-10-CM

## 2015-11-08 NOTE — Telephone Encounter (Signed)
PCC please help.  

## 2015-11-08 NOTE — Telephone Encounter (Signed)
Ordered changed.

## 2015-11-10 NOTE — Telephone Encounter (Signed)
Referral faxed to [place] [place].

## 2015-11-12 ENCOUNTER — Encounter: Payer: Self-pay | Admitting: Internal Medicine

## 2015-11-15 DIAGNOSIS — M2021 Hallux rigidus, right foot: Secondary | ICD-10-CM | POA: Diagnosis not present

## 2015-11-17 ENCOUNTER — Telehealth: Payer: Self-pay

## 2015-11-17 NOTE — Telephone Encounter (Signed)
Pt called complaining of increased swelling in post op foot, she did admit to being on her foot more than 5 min every hour. She denied fever chills or nausea, denied calf pain or tenderness. She did admit that pain has improved since Tuesday. Advised pt to watch for s/s of infection, and that she needed to rest, ice and elevate foot. She is to call if any acute symptoms occur.

## 2015-11-17 NOTE — Telephone Encounter (Signed)
Left vm for pt to return call for assistance phone number to call [phone]

## 2015-11-18 ENCOUNTER — Telehealth: Payer: Self-pay | Admitting: *Deleted

## 2015-11-18 NOTE — Telephone Encounter (Signed)
Pt states she felt like she hurt her bunion last night and it is bleeding, and wants advise.  I spoke with pt and she said she spoke with Janett Billow, and has better knowledge about how to care for her foot, like she needed to sleep in the boot, and that it wasn't unusual to have old blood on the dressing right after surgery.  Pt states she's okay waiting til her office visit.

## 2015-11-21 ENCOUNTER — Telehealth: Payer: Self-pay

## 2015-11-21 NOTE — Telephone Encounter (Signed)
Spoke with pt on 11/18/15 regarding post operative bleeding. She stated that she saw blood on the dressing on her foot. She stated that the blood was brownish red. Informed pt that bleeding after this operation is normal, and brownish red blood indicates that the bleeding is stopping. Advised to elevate and rest, call if the bleeding changes to bright red or if the spot on the bandage increases in size. She denied increase in pain and denied fever, chills or nausea. Call office or seek medical care if acute symptoms change

## 2015-11-25 ENCOUNTER — Encounter: Payer: Self-pay | Admitting: Podiatry

## 2015-11-25 ENCOUNTER — Ambulatory Visit (INDEPENDENT_AMBULATORY_CARE_PROVIDER_SITE_OTHER): Payer: Managed Care, Other (non HMO)

## 2015-11-25 ENCOUNTER — Ambulatory Visit (INDEPENDENT_AMBULATORY_CARE_PROVIDER_SITE_OTHER): Payer: Managed Care, Other (non HMO) | Admitting: Podiatry

## 2015-11-25 VITALS — BP 116/84 | HR 90 | Resp 16

## 2015-11-25 DIAGNOSIS — Z9889 Other specified postprocedural states: Secondary | ICD-10-CM

## 2015-11-25 MED ORDER — HYDROCODONE-ACETAMINOPHEN 10-325 MG PO TABS: 1.0000 | ORAL_TABLET | ORAL | Status: DC | PRN

## 2015-11-25 NOTE — Progress Notes (Signed)
Subjective:     Patient ID: Olivia Mosley, female   DOB: 1956-01-25, 60 y.o.   MRN: [id number]  HPI th. She presents the office for her first postoperative visit today is patient returns to the office 1 week after surgery for the correction of her bunion big toe, right foot. She presents the office wearing a Cam Walker. She also presents the office wearing the bandage that was applied at the surgical center. She does admit that the bandage was tight and after 4 days was told to loosen the bandage. She says she has taken off her pain meds except for one. She presents to the office for her first postoperative visit today   Review of Systems     Objective:   Physical Exam neurovascular status is intact. Good wound coaptation, and sutures are intact over the first metatarsal of the right foot. No signs of redness or infection noted. No drainage noted from the surgical site. Mild discoloration from both ecchymosis and Betadine that was applied prior to the surgery. Patient has normal range of motion of the first MPJ with no K wire interference     Assessment:     S/p foot surgery right foot     Plan:     ROV.  Xrays taken reveal good positioning of the capital fragment with wires intact.  Healing at surgical site .  Betadine redressing.  Patient was told to keep bandage dry.  Walk in cam walker.  Prescription of norco was dispensed.  RTC 1 week.    Gardiner Barefoot DPM

## 2015-12-05 ENCOUNTER — Ambulatory Visit (INDEPENDENT_AMBULATORY_CARE_PROVIDER_SITE_OTHER): Payer: Managed Care, Other (non HMO)

## 2015-12-05 ENCOUNTER — Ambulatory Visit (INDEPENDENT_AMBULATORY_CARE_PROVIDER_SITE_OTHER): Payer: Managed Care, Other (non HMO) | Admitting: Podiatry

## 2015-12-05 DIAGNOSIS — M21619 Bunion of unspecified foot: Secondary | ICD-10-CM

## 2015-12-05 DIAGNOSIS — Z9889 Other specified postprocedural states: Secondary | ICD-10-CM | POA: Diagnosis not present

## 2015-12-05 DIAGNOSIS — M205X1 Other deformities of toe(s) (acquired), right foot: Secondary | ICD-10-CM

## 2015-12-07 NOTE — Progress Notes (Signed)
Subjective:     Patient ID: Olivia Mosley, female   DOB: 15-Apr-1956, 60 y.o.   MRN: [id number]  HPI patient presents stating I'm doing well and walking with minimal discomfort   Review of Systems     Objective:   Physical Exam Neurovascular status intact good range of motion first MPJ right with no crepitus of the joint or indications of pathology with hallux in rectus position    Assessment:     Doing well post osteotomy right first metatarsal    Plan:     Advised on physical therapy anti-inflammatories and continued compression immobilization. Instructed on range of motion exercises and dispensed surgical shoe to start wearing at this time  X-ray report indicated pins are in place joint congruence with good alignment noted and no indication of movement

## 2015-12-22 ENCOUNTER — Telehealth: Payer: Self-pay

## 2015-12-22 DIAGNOSIS — G4700 Insomnia, unspecified: Secondary | ICD-10-CM

## 2015-12-22 DIAGNOSIS — F418 Other specified anxiety disorders: Secondary | ICD-10-CM

## 2015-12-22 NOTE — Telephone Encounter (Signed)
Recd faxed rx refill request for xanax 0.5mg  tab from [facility] ([place])----please advise, thanks

## 2015-12-23 NOTE — Telephone Encounter (Signed)
She has changed doctors, hasn't she?

## 2015-12-26 MED ORDER — ALPRAZOLAM 0.5 MG PO TABS: 0.5000 mg | ORAL_TABLET | Freq: Two times a day (BID) | ORAL | Status: DC | PRN

## 2015-12-26 NOTE — Telephone Encounter (Signed)
Left message advising patient to call back to let us know if she has switched providers---let Omar Orrego know what patient has decided

## 2015-12-26 NOTE — Telephone Encounter (Signed)
Spoke with patient.  Patient has decided to wait until later in the year to switch providers because she has had surgeries.

## 2015-12-26 NOTE — Addendum Note (Signed)
Addended by: Janith Lima on: 12/26/2015 09:57 AM   Modules accepted: Orders

## 2015-12-26 NOTE — Telephone Encounter (Signed)
fxd

## 2015-12-26 NOTE — Telephone Encounter (Signed)
Rx written.

## 2016-01-02 ENCOUNTER — Ambulatory Visit (INDEPENDENT_AMBULATORY_CARE_PROVIDER_SITE_OTHER): Payer: Managed Care, Other (non HMO) | Admitting: Podiatry

## 2016-01-02 ENCOUNTER — Ambulatory Visit: Payer: Self-pay

## 2016-01-02 ENCOUNTER — Encounter: Payer: Self-pay | Admitting: Podiatry

## 2016-01-02 DIAGNOSIS — M21619 Bunion of unspecified foot: Secondary | ICD-10-CM

## 2016-01-02 DIAGNOSIS — Z9889 Other specified postprocedural states: Secondary | ICD-10-CM

## 2016-01-02 DIAGNOSIS — M205X1 Other deformities of toe(s) (acquired), right foot: Secondary | ICD-10-CM

## 2016-01-02 DIAGNOSIS — M779 Enthesopathy, unspecified: Secondary | ICD-10-CM | POA: Diagnosis not present

## 2016-01-02 MED ORDER — TRIAMCINOLONE ACETONIDE 10 MG/ML IJ SUSP
10.0000 mg | Freq: Once | INTRAMUSCULAR | Status: AC
  Administered 2016-01-02: 10 mg

## 2016-01-03 NOTE — Progress Notes (Signed)
Subjective:     Patient ID: Olivia Mosley, female   DOB: 1956/08/05, 60 y.o.   MRN: [id number]  HPI patient states I'm doing pretty well but I gets some pain in my forefoot if I do a lot of walking but I am back in 2 some forms issues   Review of Systems     Objective:   Physical Exam Neurovascular status intact muscle strength adequate no change in health history with patient noted to have surgical site right first MPJ with excellent range of motion of approximate 3035 and 25 plantarflexion with no crepitus in the joint surface    Assessment:     Doing well overall forefoot surgery right    Plan:     Advised on anti-inflammatory at this time soak therapy and wearing supportive cushioned shoes and not going barefoot. Reappoint one month for x-ray and earlier if any other issues should occur

## 2016-01-04 ENCOUNTER — Other Ambulatory Visit: Payer: Self-pay

## 2016-01-04 MED ORDER — CLOBETASOL PROPIONATE 0.05 % EX CREA: TOPICAL_CREAM | Freq: Two times a day (BID) | CUTANEOUS | Status: DC

## 2016-01-17 ENCOUNTER — Telehealth: Payer: Self-pay

## 2016-01-17 MED ORDER — MELOXICAM 15 MG PO TABS: 15.0000 mg | ORAL_TABLET | Freq: Every day | ORAL | Status: DC

## 2016-01-17 MED ORDER — ESCITALOPRAM OXALATE 10 MG PO TABS: 10.0000 mg | ORAL_TABLET | Freq: Every day | ORAL | Status: DC

## 2016-01-17 MED ORDER — LEVOTHYROXINE SODIUM 75 MCG PO TABS: 75.0000 ug | ORAL_TABLET | Freq: Every day | ORAL | Status: DC

## 2016-01-17 NOTE — Telephone Encounter (Signed)
Done-pt informed 

## 2016-01-17 NOTE — Telephone Encounter (Signed)
Patient states she left her meds at home and [place] the [place]. She needs 5 pills each of these ... meloxicam / levothyroxine / escitalopram ... She states if you will send it to the [facility] at [place] , [place] [phone] and phone # is [phone]. Please advise.

## 2016-02-02 ENCOUNTER — Other Ambulatory Visit: Payer: Managed Care, Other (non HMO)

## 2016-02-16 ENCOUNTER — Ambulatory Visit (INDEPENDENT_AMBULATORY_CARE_PROVIDER_SITE_OTHER): Payer: Managed Care, Other (non HMO) | Admitting: Podiatry

## 2016-02-16 ENCOUNTER — Ambulatory Visit (INDEPENDENT_AMBULATORY_CARE_PROVIDER_SITE_OTHER): Payer: Managed Care, Other (non HMO)

## 2016-02-16 ENCOUNTER — Encounter: Payer: Self-pay | Admitting: Podiatry

## 2016-02-16 VITALS — BP 143/85 | HR 89 | Resp 16

## 2016-02-16 DIAGNOSIS — M21621 Bunionette of right foot: Secondary | ICD-10-CM

## 2016-02-16 DIAGNOSIS — M21619 Bunion of unspecified foot: Secondary | ICD-10-CM

## 2016-02-16 DIAGNOSIS — M779 Enthesopathy, unspecified: Secondary | ICD-10-CM | POA: Diagnosis not present

## 2016-02-16 DIAGNOSIS — M205X1 Other deformities of toe(s) (acquired), right foot: Secondary | ICD-10-CM

## 2016-02-16 DIAGNOSIS — Z9889 Other specified postprocedural states: Secondary | ICD-10-CM

## 2016-02-16 MED ORDER — TRIAMCINOLONE ACETONIDE 10 MG/ML IJ SUSP
10.0000 mg | Freq: Once | INTRAMUSCULAR | Status: AC
  Administered 2016-02-16: 10 mg

## 2016-02-16 NOTE — Progress Notes (Signed)
Subjective:     Patient ID: Olivia Mosley, female   DOB: 08-06-1956, 60 y.o.   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 She will be seen back as needed  X-ray report indicates that the first MPJ has healed well with pins in place and joint open with inflammation around the fifth MPJ right

## 2016-02-21 ENCOUNTER — Other Ambulatory Visit: Payer: Self-pay | Admitting: *Deleted

## 2016-02-21 MED ORDER — LEVOTHYROXINE SODIUM 75 MCG PO TABS: 75.0000 ug | ORAL_TABLET | Freq: Every day | ORAL | Status: DC

## 2016-03-01 ENCOUNTER — Other Ambulatory Visit (HOSPITAL_BASED_OUTPATIENT_CLINIC_OR_DEPARTMENT_OTHER): Payer: Managed Care, Other (non HMO)

## 2016-03-01 ENCOUNTER — Ambulatory Visit: Payer: Managed Care, Other (non HMO)

## 2016-03-01 ENCOUNTER — Encounter: Payer: Self-pay | Admitting: Family

## 2016-03-01 ENCOUNTER — Ambulatory Visit (HOSPITAL_BASED_OUTPATIENT_CLINIC_OR_DEPARTMENT_OTHER): Payer: Managed Care, Other (non HMO) | Admitting: Family

## 2016-03-01 VITALS — BP 135/72 | HR 73 | Temp 98.3°F | Resp 18 | Ht 63.5 in | Wt 248.0 lb

## 2016-03-01 DIAGNOSIS — D509 Iron deficiency anemia, unspecified: Secondary | ICD-10-CM

## 2016-03-01 LAB — CBC WITH DIFFERENTIAL (CANCER CENTER ONLY)
BASO#: 0 10*3/uL (ref 0.0–0.2)
BASO%: 0.2 % (ref 0.0–2.0)
EOS%: 2.3 % (ref 0.0–7.0)
Eosinophils Absolute: 0.2 10*3/uL (ref 0.0–0.5)
HCT: 42.6 % (ref 34.8–46.6)
HGB: 14.4 g/dL (ref 11.6–15.9)
LYMPH#: 1.5 10*3/uL (ref 0.9–3.3)
LYMPH%: 17.2 % (ref 14.0–48.0)
MCH: 30.3 pg (ref 26.0–34.0)
MCHC: 33.8 g/dL (ref 32.0–36.0)
MCV: 90 fL (ref 81–101)
MONO#: 0.8 10*3/uL (ref 0.1–0.9)
MONO%: 8.6 % (ref 0.0–13.0)
NEUT#: 6.3 10*3/uL (ref 1.5–6.5)
NEUT%: 71.7 % (ref 39.6–80.0)
Platelets: 309 10*3/uL (ref 145–400)
RBC: 4.76 10*6/uL (ref 3.70–5.32)
RDW: 14 % (ref 11.1–15.7)
WBC: 8.8 10*3/uL (ref 3.9–10.0)

## 2016-03-01 NOTE — Progress Notes (Signed)
Hematology and Oncology Follow Up Visit  RASHAWNA CANEZ [id number] 1956-07-13 60 y.o. 03/01/2016   Principle Diagnosis:  Iron deficiency anemia  Current Therapy:   IV iron as indicated    Interim History: Ms. Solis is here today for a follow-up. She is doing well but still has some mild fatigue at times.  She last received Feraheme in December 2016. Her iron saturation at that time was 25% with a ferritin of 78.  No fever, chills, n/v, cough, rash, headache, dizziness, SOB, chest pain, palpitations, abdominal pain or changes in bowel or bladder habits.  She has chronic back pain due to degenerative disc disease. She has occasional numbness and tingling in her hands due to carpal tunnel syndrome. She has had no swelling or tenderness in her extremities.  She has maintained a good appetite and is staying well hydrated. Her weight is stable.  She is now swimming once a week and plans to start walking on the treadmill soon. She would really like to start losing weight.   Medications:    Medication List       This list is accurate as of: 03/01/16  2:39 PM.  Always use your most recent med list.               ALPRAZolam 0.5 MG tablet  Commonly known as:  XANAX  Take 1 tablet (0.5 mg total) by mouth 2 (two) times daily as needed for anxiety.     atorvastatin 40 MG tablet  Commonly known as:  LIPITOR  TAKE 1 TABLET BY MOUTH EVERY DAY (NEED TO SEE DR BEFORE MORE REFILLS)     Azelaic Acid 15 % cream  Commonly known as:  FINACEA  APPLY 1/2 GRAM TWICE DAILY     azelastine 0.1 % nasal spray  Commonly known as:  ASTELIN  Place 2 sprays into both nostrils 2 (two) times daily. Use in each nostril as directed     carisoprodol 350 MG tablet  Commonly known as:  SOMA  Take by mouth.     Cholecalciferol 2000 units Tabs  Take 1 tablet (2,000 Units total) by mouth daily.     clobetasol cream 0.05 %  Commonly known as:  TEMOVATE  Apply topically 2 (two) times daily.     escitalopram 10  MG tablet  Commonly known as:  LEXAPRO  Take 1 tablet (10 mg total) by mouth daily.     esomeprazole 40 MG capsule  Commonly known as:  NEXIUM  TAKE 1 CAPSULE BY MOUTH DAILY (NEED TO SEE DR BEFORE MORE REFILLS)     levothyroxine 75 MCG tablet  Commonly known as:  SYNTHROID, LEVOTHROID  Take 1 tablet (75 mcg total) by mouth daily.     meloxicam 15 MG tablet  Commonly known as:  MOBIC  Take 1 tablet (15 mg total) by mouth daily.     RESTASIS 0.05 % ophthalmic emulsion  Generic drug:  cycloSPORINE     traZODone 50 MG tablet  Commonly known as:  DESYREL  TAKE 2 TABLETS BY MOUTH AT BEDTIME AS NEEDED FOR SLEEP     tretinoin 0.025 % cream  Commonly known as:  RETIN-A  Apply topically at bedtime.     triamcinolone cream 0.5 %  Commonly known as:  KENALOG  Apply 1 application topically 3 (three) times daily.        Allergies:  Allergies  Allergen Reactions  . Flagyl [Metronidazole Hcl] Hives    Past Medical History, Surgical history, Social  history, and Family History were reviewed and updated.  Review of Systems: All other 10 point review of systems is negative.   Physical Exam:  height is 5' 3.5" (1.613 m) and weight is 248 lb (112.492 kg). Her oral temperature is 98.3 F (36.8 C). Her blood pressure is 135/72 and her pulse is 73. Her respiration is 18.   Wt Readings from Last 3 Encounters:  03/01/16 248 lb (112.492 kg)  10/19/15 250 lb (113.399 kg)  10/05/15 248 lb (112.492 kg)    Ocular: Sclerae unicteric, pupils equal, round and reactive to light Ear-nose-throat: Oropharynx clear, dentition fair Lymphatic: No cervical supraclavicular or axillary adenopathy Lungs no rales or rhonchi, good excursion bilaterally Heart regular rate and rhythm, no murmur appreciated Abd soft, nontender, positive bowel sounds, no liver or spleen tip palpated on exam  MSK no focal spinal tenderness, no joint edema Neuro: non-focal, well-oriented, appropriate affect Breasts:  Deferred  Lab Results  Component Value Date   WBC 8.8 03/01/2016   HGB 14.4 03/01/2016   HCT 42.6 03/01/2016   MCV 90 03/01/2016   PLT 309 03/01/2016   Lab Results  Component Value Date   FERRITIN 78 09/01/2015   IRON 84 09/01/2015   TIBC 335 09/01/2015   UIBC 251 09/01/2015   IRONPCTSAT 25 09/01/2015   Lab Results  Component Value Date   RETICCTPCT 1.6 09/01/2015   RBC 4.76 03/01/2016   RETICCTABS 77.8 09/01/2015   No results found for: KPAFRELGTCHN, LAMBDASER, KAPLAMBRATIO No results found for: IGGSERUM, IGA, IGMSERUM No results found for: Odetta Pink, SPEI   Chemistry      Component Value Date/Time   NA 136 10/19/2015 1622   NA 138 09/01/2015 1117   K 4.5 10/19/2015 1622   K 4.4 09/01/2015 1117   CL 104 10/19/2015 1622   CO2 25 10/19/2015 1622   CO2 18* 09/01/2015 1117   BUN 35* 10/19/2015 1622   BUN 29.4* 09/01/2015 1117   CREATININE 0.88 10/19/2015 1622   CREATININE 0.9 09/01/2015 1117      Component Value Date/Time   CALCIUM 9.2 10/19/2015 1622   CALCIUM 10.1 09/01/2015 1117   ALKPHOS 75 10/19/2015 1622   ALKPHOS 90 09/01/2015 1117   AST 19 10/19/2015 1622   AST 13 09/01/2015 1117   ALT 27 10/19/2015 1622   ALT 15 09/01/2015 1117   BILITOT 0.2 10/19/2015 1622   BILITOT 0.33 09/01/2015 1117     Impression and Plan: Ms. Lummis is 60 yo female with iron deficiency. She is doing well but has some intermittent fatigue.  He Hgb today is 14.4 and an MCV 90. We will see what her iron studies show and bring her back in for an infusion next week if needed.  We will see her back in 6 months for labs ad follow-up.  She knows to contact us with any questions or concerns. We can certainly see her sooner if need be.   Eliezer Bottom, NP 6/8/20172:39 PM

## 2016-03-01 NOTE — Progress Notes (Signed)
No treatment today per Sarah [place] NP 

## 2016-03-02 LAB — IRON AND TIBC
%SAT: 21 % (ref 21–57)
Iron: 55 ug/dL (ref 41–142)
TIBC: 258 ug/dL (ref 236–444)
UIBC: 203 ug/dL (ref 120–384)

## 2016-03-02 LAB — FERRITIN: Ferritin: 199 ng/ml (ref 9–269)

## 2016-03-02 LAB — RETICULOCYTES: Reticulocyte Count: 1.4 % (ref 0.6–2.6)

## 2016-03-05 ENCOUNTER — Telehealth: Payer: Self-pay | Admitting: *Deleted

## 2016-03-05 NOTE — Telephone Encounter (Addendum)
Patient aware of results  ----- Message from Eliezer Bottom, NP sent at 03/02/2016  2:59 PM EDT ----- Regarding: Iron  Iron studies look ok. No infusion needed at this time. Thank you!  Sarah   ----- Message -----    From: [facility]    Sent: 03/01/2016   2:23 PM      To: Eliezer Bottom, NP

## 2016-03-06 ENCOUNTER — Other Ambulatory Visit: Payer: Self-pay | Admitting: Family

## 2016-03-06 ENCOUNTER — Telehealth: Payer: Self-pay | Admitting: Family

## 2016-03-06 NOTE — Telephone Encounter (Signed)
I spoke with Olivia Mosley over the phone and she is becoming feeling a bit more fatigued and wanting to sleep during the day. Her iron studies (iron saturation 21%) were at the low end of normal last week. We will go ahead and give her a dose of Feraheme later this week since she is symptomatic.

## 2016-03-08 ENCOUNTER — Other Ambulatory Visit: Payer: Self-pay | Admitting: Family

## 2016-03-08 ENCOUNTER — Ambulatory Visit (HOSPITAL_BASED_OUTPATIENT_CLINIC_OR_DEPARTMENT_OTHER): Payer: Managed Care, Other (non HMO)

## 2016-03-08 VITALS — BP 127/73 | HR 78 | Temp 97.8°F | Resp 18

## 2016-03-08 DIAGNOSIS — D509 Iron deficiency anemia, unspecified: Secondary | ICD-10-CM | POA: Diagnosis not present

## 2016-03-08 MED ORDER — SODIUM CHLORIDE 0.9 % IV SOLN
510.0000 mg | Freq: Once | INTRAVENOUS | Status: AC
  Administered 2016-03-08: 510 mg via INTRAVENOUS
  Filled 2016-03-08: qty 17

## 2016-03-08 MED ORDER — SODIUM CHLORIDE 0.9 % IV SOLN
Freq: Once | INTRAVENOUS | Status: AC
  Administered 2016-03-08: 14:00:00 via INTRAVENOUS

## 2016-03-08 NOTE — Patient Instructions (Signed)

## 2016-04-12 ENCOUNTER — Encounter: Payer: Self-pay | Admitting: Internal Medicine

## 2016-04-12 LAB — HM MAMMOGRAPHY

## 2016-04-16 ENCOUNTER — Encounter: Payer: Self-pay | Admitting: Internal Medicine

## 2016-04-25 ENCOUNTER — Other Ambulatory Visit: Payer: Self-pay | Admitting: Internal Medicine

## 2016-04-25 ENCOUNTER — Telehealth: Payer: Self-pay

## 2016-04-25 DIAGNOSIS — Z1231 Encounter for screening mammogram for malignant neoplasm of breast: Secondary | ICD-10-CM

## 2016-04-25 NOTE — Telephone Encounter (Signed)
Need orders faxed over for L diagnostic mammogram with ultrasound as needed. Can you please fax that over too [phone]. Thank you.

## 2016-04-25 NOTE — Telephone Encounter (Signed)
Please order.  thanks

## 2016-04-25 NOTE — Telephone Encounter (Signed)
Called again for the order to be put in. Pt is there waiting. Please follow up thanks.

## 2016-04-25 NOTE — Telephone Encounter (Signed)
Written order per Dr. Ronnald Ramp was faxed to [facility] at [phone] as requested. The original copy is on [employer].

## 2016-04-25 NOTE — Telephone Encounter (Signed)
[facility].. Is where the other need to go and needs to be faxed. [phone]

## 2016-04-25 NOTE — Telephone Encounter (Signed)
Where is she?

## 2016-04-30 ENCOUNTER — Ambulatory Visit: Payer: Managed Care, Other (non HMO) | Admitting: Internal Medicine

## 2016-05-02 ENCOUNTER — Other Ambulatory Visit (INDEPENDENT_AMBULATORY_CARE_PROVIDER_SITE_OTHER): Payer: Managed Care, Other (non HMO)

## 2016-05-02 ENCOUNTER — Encounter: Payer: Self-pay | Admitting: Internal Medicine

## 2016-05-02 ENCOUNTER — Ambulatory Visit (INDEPENDENT_AMBULATORY_CARE_PROVIDER_SITE_OTHER): Payer: Managed Care, Other (non HMO) | Admitting: Internal Medicine

## 2016-05-02 VITALS — BP 124/80 | HR 79 | Temp 98.6°F | Resp 16 | Ht 63.5 in | Wt 256.0 lb

## 2016-05-02 DIAGNOSIS — D509 Iron deficiency anemia, unspecified: Secondary | ICD-10-CM

## 2016-05-02 DIAGNOSIS — R739 Hyperglycemia, unspecified: Secondary | ICD-10-CM

## 2016-05-02 DIAGNOSIS — E038 Other specified hypothyroidism: Secondary | ICD-10-CM

## 2016-05-02 DIAGNOSIS — K589 Irritable bowel syndrome without diarrhea: Secondary | ICD-10-CM | POA: Diagnosis not present

## 2016-05-02 LAB — COMPREHENSIVE METABOLIC PANEL
ALT: 29 U/L (ref 0–35)
AST: 16 U/L (ref 0–37)
Albumin: 4.1 g/dL (ref 3.5–5.2)
Alkaline Phosphatase: 71 U/L (ref 39–117)
BUN: 21 mg/dL (ref 6–23)
CO2: 31 mEq/L (ref 19–32)
Calcium: 9.6 mg/dL (ref 8.4–10.5)
Chloride: 104 mEq/L (ref 96–112)
Creatinine, Ser: 0.67 mg/dL (ref 0.40–1.20)
GFR: 95.42 mL/min (ref >60.00)
Glucose, Bld: 89 mg/dL (ref 70–99)
Potassium: 4.8 mEq/L (ref 3.5–5.1)
Sodium: 137 mEq/L (ref 135–145)
Total Bilirubin: 0.3 mg/dL (ref 0.2–1.2)
Total Protein: 6.9 g/dL (ref 6.0–8.3)

## 2016-05-02 LAB — CBC WITH DIFFERENTIAL/PLATELET
Basophils Absolute: 0 10*3/uL (ref 0.0–0.1)
Basophils Relative: 0.4 % (ref 0.0–3.0)
Eosinophils Absolute: 0.2 10*3/uL (ref 0.0–0.7)
Eosinophils Relative: 2.6 % (ref 0.0–5.0)
HCT: 41.8 % (ref 36.0–46.0)
Hemoglobin: 14.1 g/dL (ref 12.0–15.0)
Lymphocytes Relative: 19.1 % (ref 12.0–46.0)
Lymphs Abs: 1.5 10*3/uL (ref 0.7–4.0)
MCHC: 33.6 g/dL (ref 30.0–36.0)
MCV: 89 fl (ref 78.0–100.0)
Monocytes Absolute: 0.8 10*3/uL (ref 0.1–1.0)
Monocytes Relative: 10.7 % (ref 3.0–12.0)
Neutro Abs: 5.1 10*3/uL (ref 1.4–7.7)
Neutrophils Relative %: 67.2 % (ref 43.0–77.0)
Platelets: 304 10*3/uL (ref 150.0–400.0)
RBC: 4.7 Mil/uL (ref 3.87–5.11)
RDW: 14.5 % (ref 11.5–15.5)
WBC: 7.6 10*3/uL (ref 4.0–10.5)

## 2016-05-02 LAB — TSH: TSH: 2.54 u[IU]/mL (ref 0.35–4.50)

## 2016-05-02 LAB — HEMOGLOBIN A1C: Hgb A1c MFr Bld: 5.8 % (ref 4.6–6.5)

## 2016-05-02 NOTE — Patient Instructions (Signed)

## 2016-05-02 NOTE — Progress Notes (Signed)
Pre visit review using our clinic review tool, if applicable. No additional management support is needed unless otherwise documented below in the visit note. 

## 2016-05-02 NOTE — Progress Notes (Signed)
Subjective:  Patient ID: Olivia Mosley, female    DOB: 1956-01-11  Age: 60 y.o. MRN: [id number]  CC: Abdominal Pain and Hypothyroidism   HPI TURA PAULHUS presents for follow-up on hypothyroidism and chronic, recurrent episodes of bilateral upper quadrant abdominal pain. She's had the abdominal pain off and on for about 3 years. She was told before by a GI doctor that it is caused by IBS and she chooses not to treat it. She is due for thyroid check and complains of weight gain and fatigue. She has had no recent episodes of constipation, diarrhea, loss of appetite, trouble swallowing, edema, palpitations, or near-syncope.  Outpatient Medications Prior to Visit  Medication Sig Dispense Refill  . ALPRAZolam (XANAX) 0.5 MG tablet Take 1 tablet (0.5 mg total) by mouth 2 (two) times daily as needed for anxiety. 60 tablet 3  . atorvastatin (LIPITOR) 40 MG tablet TAKE 1 TABLET BY MOUTH EVERY DAY (NEED TO SEE DR BEFORE MORE REFILLS) 90 tablet 2  . Azelaic Acid (FINACEA) 15 % cream APPLY 1/2 GRAM TWICE DAILY 50 g 11  . azelastine (ASTELIN) 0.1 % nasal spray Place 2 sprays into both nostrils 2 (two) times daily. Use in each nostril as directed 30 mL 11  . Cholecalciferol 2000 UNITS TABS Take 1 tablet (2,000 Units total) by mouth daily. 90 tablet 3  . clobetasol cream (TEMOVATE) 0.05 % Apply topically 2 (two) times daily. 30 g 1  . escitalopram (LEXAPRO) 10 MG tablet Take 1 tablet (10 mg total) by mouth daily. 30 tablet 0  . esomeprazole (NEXIUM) 40 MG capsule TAKE 1 CAPSULE BY MOUTH DAILY (NEED TO SEE DR BEFORE MORE REFILLS) 90 capsule 3  . levothyroxine (SYNTHROID, LEVOTHROID) 75 MCG tablet Take 1 tablet (75 mcg total) by mouth daily. 90 tablet 2  . meloxicam (MOBIC) 15 MG tablet Take 1 tablet (15 mg total) by mouth daily. 30 tablet 0  . RESTASIS 0.05 % ophthalmic emulsion     . traZODone (DESYREL) 50 MG tablet TAKE 2 TABLETS BY MOUTH AT BEDTIME AS NEEDED FOR SLEEP 180 tablet 2  . tretinoin (RETIN-A)  0.025 % cream Apply topically at bedtime. 45 g 5  . triamcinolone cream (KENALOG) 0.5 % Apply 1 application topically 3 (three) times daily. 30 g 1  . carisoprodol (SOMA) 350 MG tablet Take by mouth.     No facility-administered medications prior to visit.     ROS Review of Systems  Constitutional: Positive for unexpected weight change. Negative for activity change, appetite change, chills, diaphoresis and fatigue.  HENT: Negative.  Negative for trouble swallowing.   Eyes: Negative.   Respiratory: Negative.  Negative for cough, choking, chest tightness, shortness of breath and stridor.   Cardiovascular: Negative.  Negative for chest pain, palpitations and leg swelling.  Gastrointestinal: Positive for abdominal pain. Negative for abdominal distention, constipation, diarrhea, nausea and vomiting.  Endocrine: Negative.   Genitourinary: Negative.  Negative for difficulty urinating, dysuria and hematuria.  Musculoskeletal: Negative.  Negative for arthralgias, back pain, joint swelling, myalgias and neck pain.  Skin: Negative.  Negative for rash.  Allergic/Immunologic: Negative.   Neurological: Negative.   Hematological: Negative.  Negative for adenopathy. Does not bruise/bleed easily.  Psychiatric/Behavioral: Negative for behavioral problems, decreased concentration, dysphoric mood, self-injury, sleep disturbance and suicidal ideas. The patient is nervous/anxious.     Objective:  BP 124/80 (BP Location: Left Arm, Patient Position: Sitting, Cuff Size: Large)   Pulse 79   Temp 98.6 F (37 C) (  Oral)   Resp 16   Ht 5' 3.5" (1.613 m)   Wt 256 lb (116.1 kg)   SpO2 97%   BMI 44.64 kg/m   BP Readings from Last 3 Encounters:  05/02/16 124/80  03/08/16 127/73  03/01/16 135/72    Wt Readings from Last 3 Encounters:  05/02/16 256 lb (116.1 kg)  03/01/16 248 lb (112.5 kg)  10/19/15 250 lb (113.4 kg)    Physical Exam  Constitutional: She is oriented to person, place, and time. No  distress.  HENT:  Mouth/Throat: Oropharynx is clear and moist. No oropharyngeal exudate.  Eyes: Conjunctivae are normal. Right eye exhibits no discharge. Left eye exhibits no discharge. No scleral icterus.  Neck: Normal range of motion. Neck supple. No JVD present. No tracheal deviation present. No thyromegaly present.  Cardiovascular: Normal rate, regular rhythm, normal heart sounds and intact distal pulses.  Exam reveals no gallop and no friction rub.   No murmur heard. Pulmonary/Chest: Effort normal and breath sounds normal. No stridor. No respiratory distress. She has no wheezes. She has no rales. She exhibits no tenderness.  Abdominal: Soft. Bowel sounds are normal. She exhibits no distension and no mass. There is no tenderness. There is no rebound and no guarding.  Musculoskeletal: Normal range of motion. She exhibits no edema, tenderness or deformity.  Lymphadenopathy:    She has no cervical adenopathy.  Neurological: She is oriented to person, place, and time.  Skin: Skin is warm and dry. No rash noted. She is not diaphoretic. No erythema. No pallor.  Vitals reviewed.   Lab Results  Component Value Date   WBC 7.6 05/02/2016   HGB 14.1 05/02/2016   HCT 41.8 05/02/2016   PLT 304.0 05/02/2016   GLUCOSE 89 05/02/2016   CHOL 130 10/19/2015   TRIG 158.0 (H) 10/19/2015   HDL 53.40 10/19/2015   LDLCALC 45 10/19/2015   ALT 29 05/02/2016   AST 16 05/02/2016   NA 137 05/02/2016   K 4.8 05/02/2016   CL 104 05/02/2016   CREATININE 0.67 05/02/2016   BUN 21 05/02/2016   CO2 31 05/02/2016   TSH 2.54 05/02/2016   INR 1.0 10/19/2015   HGBA1C 5.8 05/02/2016    No results found.  Assessment & Plan:   Shawnese was seen today for abdominal pain and hypothyroidism.  Diagnoses and all orders for this visit:  Other specified hypothyroidism- her TSH is in the normal range, she will remain on the current dose of levothyroxine -     TSH; Future  Anemia, iron deficiency- improvement  noted -     CBC with Differential/Platelet; Future  Hyperglycemia-her A1c is stable at 5.8%, she has prediabetes and agrees to work on her lifestyle modifications. -     Comprehensive metabolic panel; Future -     Hemoglobin A1c; Future  IBS (irritable bowel syndrome)- I offered her reassurance that this is not a serious condition though it can cause a lot of symptoms, at this time she does not wish to treat it.   I have discontinued Ms. Alpha carisoprodol. I am also having her maintain her esomeprazole, Cholecalciferol, triamcinolone cream, azelastine, atorvastatin, traZODone, Azelaic Acid, tretinoin, ALPRAZolam, clobetasol cream, escitalopram, meloxicam, levothyroxine, and RESTASIS.  No orders of the defined types were placed in this encounter.    Follow-up: Return in about 6 months (around 11/02/2016).  Scarlette Calico, MD

## 2016-05-03 ENCOUNTER — Encounter: Payer: Self-pay | Admitting: Internal Medicine

## 2016-05-03 DIAGNOSIS — K589 Irritable bowel syndrome without diarrhea: Secondary | ICD-10-CM | POA: Insufficient documentation

## 2016-05-10 ENCOUNTER — Telehealth: Payer: Self-pay | Admitting: Emergency Medicine

## 2016-05-10 ENCOUNTER — Other Ambulatory Visit: Payer: Self-pay | Admitting: Internal Medicine

## 2016-05-10 MED ORDER — CEFDINIR 300 MG PO CAPS: 300.0000 mg | ORAL_CAPSULE | Freq: Two times a day (BID) | ORAL | 1 refills | Status: DC

## 2016-05-10 NOTE — Telephone Encounter (Signed)
RX sent

## 2016-05-10 NOTE — Telephone Encounter (Signed)
Pt called and is going out of town. She wants to know if she can get something for a sinus infection. Pt states Dr Ronnald Ramp has written a prescription for her like this before. Pharmacy is [facility] in [place]. Please follow up and let her know what you recommend. Thanks.

## 2016-05-10 NOTE — Telephone Encounter (Signed)
Left patient a detailed message

## 2016-06-17 ENCOUNTER — Other Ambulatory Visit: Payer: Self-pay | Admitting: Internal Medicine

## 2016-07-08 ENCOUNTER — Other Ambulatory Visit: Payer: Self-pay | Admitting: Internal Medicine

## 2016-07-08 DIAGNOSIS — J3081 Allergic rhinitis due to animal (cat) (dog) hair and dander: Secondary | ICD-10-CM

## 2016-07-17 LAB — HM DIABETES EYE EXAM

## 2016-08-01 ENCOUNTER — Telehealth: Payer: Self-pay | Admitting: Hematology & Oncology

## 2016-08-01 NOTE — Telephone Encounter (Signed)
Patient called and cx 09/06/16 apt and resch for 09/03/16

## 2016-08-15 ENCOUNTER — Encounter: Payer: Self-pay | Admitting: Internal Medicine

## 2016-08-26 ENCOUNTER — Encounter: Payer: Self-pay | Admitting: Internal Medicine

## 2016-09-03 ENCOUNTER — Ambulatory Visit (HOSPITAL_BASED_OUTPATIENT_CLINIC_OR_DEPARTMENT_OTHER): Payer: Managed Care, Other (non HMO) | Admitting: Hematology & Oncology

## 2016-09-03 ENCOUNTER — Other Ambulatory Visit (HOSPITAL_BASED_OUTPATIENT_CLINIC_OR_DEPARTMENT_OTHER): Payer: Managed Care, Other (non HMO)

## 2016-09-03 VITALS — BP 127/71 | HR 72 | Temp 98.0°F | Wt 250.0 lb

## 2016-09-03 DIAGNOSIS — D509 Iron deficiency anemia, unspecified: Secondary | ICD-10-CM | POA: Diagnosis not present

## 2016-09-03 DIAGNOSIS — B9689 Other specified bacterial agents as the cause of diseases classified elsewhere: Secondary | ICD-10-CM

## 2016-09-03 DIAGNOSIS — D508 Other iron deficiency anemias: Secondary | ICD-10-CM

## 2016-09-03 DIAGNOSIS — J019 Acute sinusitis, unspecified: Secondary | ICD-10-CM | POA: Diagnosis not present

## 2016-09-03 LAB — CBC WITH DIFFERENTIAL (CANCER CENTER ONLY)
BASO#: 0 10*3/uL (ref 0.0–0.2)
BASO%: 0.2 % (ref 0.0–2.0)
EOS%: 0.4 % (ref 0.0–7.0)
Eosinophils Absolute: 0 10*3/uL (ref 0.0–0.5)
HCT: 43.2 % (ref 34.8–46.6)
HGB: 14.6 g/dL (ref 11.6–15.9)
LYMPH#: 1.4 10*3/uL (ref 0.9–3.3)
LYMPH%: 13.8 % — ABNORMAL LOW (ref 14.0–48.0)
MCH: 30.4 pg (ref 26.0–34.0)
MCHC: 33.8 g/dL (ref 32.0–36.0)
MCV: 90 fL (ref 81–101)
MONO#: 1 10*3/uL — ABNORMAL HIGH (ref 0.1–0.9)
MONO%: 10.2 % (ref 0.0–13.0)
NEUT#: 7.7 10*3/uL — ABNORMAL HIGH (ref 1.5–6.5)
NEUT%: 75.4 % (ref 39.6–80.0)
Platelets: 327 10*3/uL (ref 145–400)
RBC: 4.8 10*6/uL (ref 3.70–5.32)
RDW: 13.5 % (ref 11.1–15.7)
WBC: 10.2 10*3/uL — ABNORMAL HIGH (ref 3.9–10.0)

## 2016-09-03 MED ORDER — AMOXICILLIN-POT CLAVULANATE 875-125 MG PO TABS: 1.0000 | ORAL_TABLET | Freq: Two times a day (BID) | ORAL | 0 refills | Status: DC

## 2016-09-03 NOTE — Progress Notes (Signed)
Hematology and Oncology Follow Up Visit  Olivia Mosley [id number] [birth year] 60 y.o. 09/03/2016   Principle Diagnosis:  Iron deficiency anemia  Current Therapy:   IV iron as indicated    Interim History: Olivia Mosley is here today for a follow-up.she is complaining of some pain with her sinuses. She notes a purulent discharge. She has some heaviness with her sinuses. She is using a nasal spray but this is not helping. She's had no fever. She's had no bloody discharge.  We will go ahead and put her on some Augmentin. It sounds like she has acute sinusitis.  She does feel a little tired. She last received iron back in June. We last saw her at that time, her iron saturation was 21%.  She's had no obvious bleeding. If she did have a mammogram earlier this year.  She's had no rashes. She does have some ecchymoses. I think this probably is from her being on meloxicam.  She is going take part in a clinical trial at [facility] for weight loss. She really is not happy with her weight. She really wants to lose weight. She does not want any type of weight reduction surgery.   Overall, her performance status is ECOG 0.   Medications:    Medication List       Accurate as of 09/03/16  3:14 PM. Always use your most recent med list.          ALPRAZolam 0.5 MG tablet Commonly known as:  XANAX Take 1 tablet (0.5 mg total) by mouth 2 (two) times daily as needed for anxiety.   atorvastatin 40 MG tablet Commonly known as:  LIPITOR TAKE 1 TABLET BY MOUTH EVERY DAY (NEED TO SEE DR BEFORE MORE REFILLS)   Azelaic Acid 15 % cream Commonly known as:  FINACEA APPLY 1/2 GRAM TWICE DAILY   azelastine 0.1 % nasal spray Commonly known as:  ASTELIN USE 2 SPRAYS IN EACH NOSTRIL TWICE DAILY   cefdinir 300 MG capsule Commonly known as:  OMNICEF Take 1 capsule (300 mg total) by mouth 2 (two) times daily.   Cholecalciferol 2000 units Tabs Take 1 tablet (2,000 Units total) by mouth  daily.   clobetasol cream 0.05 % Commonly known as:  TEMOVATE Apply topically 2 (two) times daily.   escitalopram 10 MG tablet Commonly known as:  LEXAPRO Take 1 tablet (10 mg total) by mouth daily.   escitalopram 10 MG tablet Commonly known as:  LEXAPRO TAKE 1 TABLET BY MOUTH DAILY   esomeprazole 40 MG capsule Commonly known as:  NEXIUM TAKE 1 CAPSULE BY MOUTH DAILY (NEED TO SEE DR BEFORE MORE REFILLS)   levothyroxine 75 MCG tablet Commonly known as:  SYNTHROID, LEVOTHROID Take 1 tablet (75 mcg total) by mouth daily.   meloxicam 15 MG tablet Commonly known as:  MOBIC Take 1 tablet (15 mg total) by mouth daily.   RESTASIS 0.05 % ophthalmic emulsion Generic drug:  cycloSPORINE   traZODone 50 MG tablet Commonly known as:  DESYREL TAKE 2 TABLETS BY MOUTH AT BEDTIME AS NEEDED FOR SLEEP   tretinoin 0.025 % cream Commonly known as:  RETIN-A Apply topically at bedtime.   triamcinolone cream 0.5 % Commonly known as:  KENALOG Apply 1 application topically 3 (three) times daily.       Allergies:  Allergies  Allergen Reactions  . Flagyl [Metronidazole Hcl] Hives    Past Medical History, Surgical history, Social history, and Family History were reviewed and updated.  Review  of Systems: All other 10 point review of systems is negative.   Physical Exam:  weight is 250 lb (113.4 kg). Her temperature is 98 F (36.7 C). Her blood pressure is 127/71 and her pulse is 72.   Wt Readings from Last 3 Encounters:  09/03/16 250 lb (113.4 kg)  05/02/16 256 lb (116.1 kg)  03/01/16 248 lb (112.5 kg)    Morbidly obese white female in no obvious distress. Head and neck exam shows no ocular or oral lesions. She has some tenderness over her maxillary sinuses. She has some congestion in the nasal passages. There is no oral lesions. She has no adenopathy in the neck. Lungs are clear bilaterally. Cardiac exam regular rate and rhythm with no murmurs, rubs or bruits. Abdomen is soft. She  has good bowel sounds. She is obese. She has no fluid wave. There is no palpable liver or spleen tip. Back exam shows no tenderness over the spine, ribs or hips. Extremities shows no clubbing, cyanosis or edema. Her logical exam shows no focal neurological deficit. Skin exam shows scattered ecchymoses, mostly on her arms.    Lab Results  Component Value Date   WBC 10.2 (H) 09/03/2016   HGB 14.6 09/03/2016   HCT 43.2 09/03/2016   MCV 90 09/03/2016   PLT 327 09/03/2016   Lab Results  Component Value Date   FERRITIN 199 03/01/2016   IRON 55 03/01/2016   TIBC 258 03/01/2016   UIBC 203 03/01/2016   IRONPCTSAT 21 03/01/2016   Lab Results  Component Value Date   RETICCTPCT 1.6 09/01/2015   RBC 4.80 09/03/2016   RETICCTABS 77.8 09/01/2015   No results found for: KPAFRELGTCHN, LAMBDASER, KAPLAMBRATIO No results found for: Kandis Cocking, IGMSERUM No results found for: Odetta Pink, SPEI   Chemistry      Component Value Date/Time   NA 137 05/02/2016 1614   NA 138 09/01/2015 1117   K 4.8 05/02/2016 1614   K 4.4 09/01/2015 1117   CL 104 05/02/2016 1614   CO2 31 05/02/2016 1614   CO2 18 (L) 09/01/2015 1117   BUN 21 05/02/2016 1614   BUN 29.4 (H) 09/01/2015 1117   CREATININE 0.67 05/02/2016 1614   CREATININE 0.9 09/01/2015 1117      Component Value Date/Time   CALCIUM 9.6 05/02/2016 1614   CALCIUM 10.1 09/01/2015 1117   ALKPHOS 71 05/02/2016 1614   ALKPHOS 90 09/01/2015 1117   AST 16 05/02/2016 1614   AST 13 09/01/2015 1117   ALT 29 05/02/2016 1614   ALT 15 09/01/2015 1117   BILITOT 0.3 05/02/2016 1614   BILITOT 0.33 09/01/2015 1117     Impression and Plan: Olivia Mosley is 60 yo female with iron deficiency. She feels that her iron probably is low again. We will have to see what the iron studies show. I told her that with the new insurance rules, we just cannot give her iron without having the studies back.  I think that the  Augmentin will help with her sinusitis. She very much appreciates [place] trying to help her out and not have and have her go see her family doctor which is hard for her to do.   We will plan to get her back to see [place] in another 2 or 3 months.    Olivia Napoleon, MD 12/11/20173:14 PM

## 2016-09-03 NOTE — Progress Notes (Signed)
I spoke with Ms. Fagundo over the phone and she is becoming feeling a bit more fatigued and wanting to sleep during the day. Her iron studies (iron saturation 21%) were at the low end of normal last week. We will go ahead and give her a dose of Feraheme later this week since she is symptomatic.

## 2016-09-04 ENCOUNTER — Telehealth: Payer: Self-pay | Admitting: *Deleted

## 2016-09-04 LAB — IRON AND TIBC
%SAT: 44 % (ref 21–57)
Iron: 122 ug/dL (ref 41–142)
TIBC: 278 ug/dL (ref 236–444)
UIBC: 155 ug/dL (ref 120–384)

## 2016-09-04 LAB — FERRITIN: Ferritin: 239 ng/ml (ref 9–269)

## 2016-09-04 LAB — RETICULOCYTES: Reticulocyte Count: 1.7 % (ref 0.6–2.6)

## 2016-09-04 NOTE — Telephone Encounter (Addendum)
Patient is aware of results.  ----- Message from Eliezer Bottom, NP sent at 09/04/2016 12:24 PM EST ----- Regarding: Iron Iron studies look good. No infusion needed. Thank you!  Sarah  ----- Message ----- From: Interface, Lab In Three Zero One Sent: 09/03/2016   2:40 PM To: Eliezer Bottom, NP

## 2016-09-06 ENCOUNTER — Other Ambulatory Visit: Payer: Managed Care, Other (non HMO)

## 2016-09-06 ENCOUNTER — Ambulatory Visit: Payer: Managed Care, Other (non HMO) | Admitting: Hematology & Oncology

## 2016-09-25 ENCOUNTER — Other Ambulatory Visit: Payer: Self-pay | Admitting: Internal Medicine

## 2016-11-23 ENCOUNTER — Other Ambulatory Visit: Payer: Self-pay | Admitting: Internal Medicine

## 2016-12-03 ENCOUNTER — Other Ambulatory Visit: Payer: Managed Care, Other (non HMO)

## 2016-12-03 ENCOUNTER — Ambulatory Visit: Payer: Managed Care, Other (non HMO)

## 2016-12-03 ENCOUNTER — Telehealth: Payer: Self-pay | Admitting: Internal Medicine

## 2016-12-03 ENCOUNTER — Ambulatory Visit: Payer: Managed Care, Other (non HMO) | Admitting: Hematology & Oncology

## 2016-12-03 NOTE — Telephone Encounter (Signed)
Pt called stating that she thinks that she has a sinus infection. She said that it has been going on for about a week and does not want it to go to her chest. She asked if we could send in a prescription that could help with this to [facility] in [place]. Please advise. Thanks [employer]

## 2016-12-04 ENCOUNTER — Other Ambulatory Visit: Payer: Self-pay | Admitting: Physical Medicine and Rehabilitation

## 2016-12-04 DIAGNOSIS — M545 Low back pain: Secondary | ICD-10-CM

## 2016-12-04 NOTE — Telephone Encounter (Signed)
This sounds viral I don't think antibiotics are indicated If the symptoms don't resolve over the next week and or if she starts feeling worse then she should be seen

## 2016-12-05 NOTE — Telephone Encounter (Signed)
Noted  

## 2016-12-05 NOTE — Telephone Encounter (Signed)
Spoke to pt regarding needing an appointment for her sinus symptoms. She already has an appointment scheduled for tomorrow (12/06/16) at 4:00 for a 6 month fu. She said that she will discuss her sinus issues during this appointment and if another appointment needs to be made for the 6 month fu that can be done while she is here. Gareth Eagle

## 2016-12-05 NOTE — Telephone Encounter (Signed)
LVM for pt to call back as soon as possible.   RE: Per PCP, pt needs to be evaluated.

## 2016-12-06 ENCOUNTER — Encounter: Payer: Self-pay | Admitting: Internal Medicine

## 2016-12-06 ENCOUNTER — Ambulatory Visit (INDEPENDENT_AMBULATORY_CARE_PROVIDER_SITE_OTHER): Payer: Managed Care, Other (non HMO) | Admitting: Internal Medicine

## 2016-12-06 ENCOUNTER — Other Ambulatory Visit (INDEPENDENT_AMBULATORY_CARE_PROVIDER_SITE_OTHER): Payer: Managed Care, Other (non HMO)

## 2016-12-06 VITALS — BP 110/60 | HR 84 | Temp 97.8°F | Resp 16 | Ht 63.5 in | Wt 254.1 lb

## 2016-12-06 DIAGNOSIS — E785 Hyperlipidemia, unspecified: Secondary | ICD-10-CM

## 2016-12-06 DIAGNOSIS — R2231 Localized swelling, mass and lump, right upper limb: Secondary | ICD-10-CM

## 2016-12-06 DIAGNOSIS — J0100 Acute maxillary sinusitis, unspecified: Secondary | ICD-10-CM | POA: Diagnosis not present

## 2016-12-06 DIAGNOSIS — E038 Other specified hypothyroidism: Secondary | ICD-10-CM

## 2016-12-06 LAB — URINALYSIS, ROUTINE W REFLEX MICROSCOPIC
Bilirubin Urine: NEGATIVE
Hgb urine dipstick: NEGATIVE
Ketones, ur: NEGATIVE
Leukocytes, UA: NEGATIVE
Nitrite: NEGATIVE
Specific Gravity, Urine: 1.015 (ref 1.000–1.030)
Total Protein, Urine: NEGATIVE
Urine Glucose: NEGATIVE
Urobilinogen, UA: 0.2 (ref 0.0–1.0)
pH: 6 (ref 5.0–8.0)

## 2016-12-06 MED ORDER — CEFDINIR 300 MG PO CAPS: 300.0000 mg | ORAL_CAPSULE | Freq: Two times a day (BID) | ORAL | 0 refills | Status: DC

## 2016-12-06 NOTE — Patient Instructions (Signed)

## 2016-12-06 NOTE — Progress Notes (Signed)
Pre visit review using our clinic review tool, if applicable. No additional management support is needed unless otherwise documented below in the visit note. 

## 2016-12-06 NOTE — Progress Notes (Signed)
Subjective:  Patient ID: Olivia Mosley, female    DOB: 03/20/1956  Age: 61 y.o. MRN: [id number]  CC: Hypothyroidism; Hyperlipidemia; and Sinusitis   HPI Olivia Mosley presents for f/up - She complains of a 10 day history of runny nose, facial pain, earaches, and chills. She denies fever or cough.   She also complains of muscle aches and muscle weakness and wants to stop taking the statin.  She complains of a bump under her skin over her right forearm. She tells me over the last few months it is been enlarging and becoming more uncomfortable.  Outpatient Medications Prior to Visit  Medication Sig Dispense Refill  . ALPRAZolam (XANAX) 0.5 MG tablet Take 1 tablet (0.5 mg total) by mouth 2 (two) times daily as needed for anxiety. 60 tablet 3  . Azelaic Acid (FINACEA) 15 % cream APPLY 1/2 GRAM TWICE DAILY 50 g 11  . azelastine (ASTELIN) 0.1 % nasal spray USE 2 SPRAYS IN EACH NOSTRIL TWICE DAILY 30 mL 11  . Cholecalciferol 2000 UNITS TABS Take 1 tablet (2,000 Units total) by mouth daily. 90 tablet 3  . escitalopram (LEXAPRO) 10 MG tablet Take 1 tablet (10 mg total) by mouth daily. 30 tablet 0  . escitalopram (LEXAPRO) 10 MG tablet TAKE 1 TABLET BY MOUTH DAILY 90 tablet 2  . esomeprazole (NEXIUM) 40 MG capsule TAKE 1 CAPSULE BY MOUTH DAILY (NEED TO SEE DR BEFORE MORE REFILLS) 90 capsule 3  . levothyroxine (SYNTHROID, LEVOTHROID) 75 MCG tablet TAKE 1 TABLET BY MOUTH DAILY 90 tablet 1  . RESTASIS 0.05 % ophthalmic emulsion     . traZODone (DESYREL) 50 MG tablet TAKE 2 TABLETS BY MOUTH AT BEDTIME AS NEEDED FOR SLEEP 180 tablet 2  . tretinoin (RETIN-A) 0.025 % cream Apply topically at bedtime. 45 g 5  . atorvastatin (LIPITOR) 40 MG tablet TAKE 1 TABLET BY MOUTH EVERY DAY (NEED TO SEE DR BEFORE MORE REFILLS) 90 tablet 2  . amoxicillin-clavulanate (AUGMENTIN) 875-125 MG tablet Take 1 tablet by mouth 2 (two) times daily. 14 tablet 0  . cefdinir (OMNICEF) 300 MG capsule Take 1 capsule (300 mg total)  by mouth 2 (two) times daily. 20 capsule 1  . clobetasol cream (TEMOVATE) 0.05 % Apply topically 2 (two) times daily. 30 g 1  . meloxicam (MOBIC) 15 MG tablet Take 1 tablet (15 mg total) by mouth daily. 30 tablet 0  . triamcinolone cream (KENALOG) 0.5 % Apply 1 application topically 3 (three) times daily. 30 g 1   No facility-administered medications prior to visit.     ROS Review of Systems  Constitutional: Positive for chills, fatigue and unexpected weight change (wt gain). Negative for diaphoresis and fever.  HENT: Positive for congestion, ear pain, facial swelling, postnasal drip, rhinorrhea, sinus pain and sinus pressure. Negative for ear discharge, sore throat, tinnitus and voice change.   Eyes: Negative for visual disturbance.  Respiratory: Negative.  Negative for cough, chest tightness, shortness of breath and wheezing.   Cardiovascular: Negative for chest pain, palpitations and leg swelling.  Gastrointestinal: Negative for abdominal pain, constipation, diarrhea, nausea and vomiting.  Endocrine: Negative.  Negative for cold intolerance and heat intolerance.  Genitourinary: Negative.   Musculoskeletal: Positive for myalgias.  Skin: Negative.   Allergic/Immunologic: Negative.   Neurological: Negative.   Hematological: Negative for adenopathy. Does not bruise/bleed easily.  Psychiatric/Behavioral: Negative.     Objective:  BP 110/60 (BP Location: Left Arm, Patient Position: Sitting, Cuff Size: Large)   Pulse  84   Temp 97.8 F (36.6 C) (Oral)   Resp 16   Ht 5' 3.5" (1.613 m)   Wt 254 lb 1.3 oz (115.2 kg)   SpO2 97%   BMI 44.30 kg/m   BP Readings from Last 3 Encounters:  12/06/16 110/60  09/03/16 127/71  05/02/16 124/80    Wt Readings from Last 3 Encounters:  12/06/16 254 lb 1.3 oz (115.2 kg)  09/03/16 250 lb (113.4 kg)  05/02/16 256 lb (116.1 kg)    Physical Exam  Constitutional: She is oriented to person, place, and time. No distress.  HENT:  Right Ear:  Hearing, tympanic membrane, external ear and ear canal normal.  Left Ear: Hearing, tympanic membrane, external ear and ear canal normal.  Nose: Rhinorrhea present. No mucosal edema. Right sinus exhibits maxillary sinus tenderness. Right sinus exhibits no frontal sinus tenderness. Left sinus exhibits maxillary sinus tenderness. Left sinus exhibits no frontal sinus tenderness.  Mouth/Throat: Oropharynx is clear and moist. No oropharyngeal exudate.  Eyes: Conjunctivae are normal. Right eye exhibits no discharge. Left eye exhibits no discharge. No scleral icterus.  Neck: Normal range of motion. Neck supple. No JVD present. No tracheal deviation present.  Cardiovascular: Normal rate, regular rhythm, normal heart sounds and intact distal pulses.  Exam reveals no gallop and no friction rub.   No murmur heard. Pulmonary/Chest: Effort normal and breath sounds normal. No stridor. No respiratory distress. She has no wheezes. She has no rales. She exhibits no tenderness.  Abdominal: Soft. Bowel sounds are normal. She exhibits no distension and no mass. There is no tenderness. There is no rebound and no guarding.  Musculoskeletal: Normal range of motion. She exhibits no edema or tenderness.       Right forearm: She exhibits deformity. She exhibits no tenderness, no bony tenderness, no swelling and no edema.  Right dorsomedial forearm reveals a 1.5 cm subcutaneous lesion that is soft/squishy/freely mobile, it is nontender and the overlying skin is normal.  Lymphadenopathy:    She has no cervical adenopathy.  Neurological: She is oriented to person, place, and time.  Skin: Skin is warm and dry. No rash noted. She is not diaphoretic. No erythema. No pallor.  Vitals reviewed.   Lab Results  Component Value Date   WBC 10.2 (H) 09/03/2016   HGB 14.6 09/03/2016   HCT 43.2 09/03/2016   PLT 327 09/03/2016   GLUCOSE 89 05/02/2016   CHOL 162 12/06/2016   TRIG 164.0 (H) 12/06/2016   HDL 37.30 (L) 12/06/2016    LDLCALC 92 12/06/2016   ALT 34 12/06/2016   AST 25 12/06/2016   NA 137 05/02/2016   K 4.8 05/02/2016   CL 104 05/02/2016   CREATININE 0.67 05/02/2016   BUN 21 05/02/2016   CO2 31 05/02/2016   TSH 2.02 12/06/2016   INR 1.0 10/19/2015   HGBA1C 5.8 05/02/2016    No results found.  Assessment & Plan:   Ellarae was seen today for hypothyroidism, hyperlipidemia and sinusitis.  Diagnoses and all orders for this visit:  Other specified hypothyroidism- Her TSH is in the normal range, I have asked her to remain on the current dose of levothyroxine -     TSH; Future -     Urinalysis, Routine w reflex microscopic; Future  Hyperlipidemia with target LDL less than 130- she complains of myalgias but her labs are negative for any evidence of myopathy or myositis. I've asked her to stop taking the statin and will recheck her lipid panel in  3-4 months and reevaluate whether or not a statin is indicated. -     Lipid panel; Future -     Hepatic function panel; Future -     CK; Future  Subacute maxillary sinusitis -     cefdinir (OMNICEF) 300 MG capsule; Take 1 capsule (300 mg total) by mouth 2 (two) times daily.  Mass of forearm, right- this feels like a lipoma to me but she remains concerned about it so I will refer to hand surgery to see if a biopsy or excision is indicated. -     Ambulatory referral to Orthopedic Surgery   I have discontinued Ms. Heacox triamcinolone cream, clobetasol cream, meloxicam, amoxicillin-clavulanate, and atorvastatin. I am also having her maintain her esomeprazole, Cholecalciferol, Azelaic Acid, tretinoin, ALPRAZolam, escitalopram, RESTASIS, escitalopram, azelastine, levothyroxine, traZODone, and cefdinir.  Meds ordered this encounter  Medications  . cefdinir (OMNICEF) 300 MG capsule    Sig: Take 1 capsule (300 mg total) by mouth 2 (two) times daily.    Dispense:  20 capsule    Refill:  0     Follow-up: Return in about 3 months (around 03/08/2017).  Scarlette Calico, MD

## 2016-12-07 ENCOUNTER — Encounter: Payer: Self-pay | Admitting: Internal Medicine

## 2016-12-07 LAB — LIPID PANEL
Cholesterol: 162 mg/dL (ref 0–200)
HDL: 37.3 mg/dL — ABNORMAL LOW (ref >39.00)
LDL Cholesterol: 92 mg/dL (ref 0–99)
NonHDL: 124.39
Total CHOL/HDL Ratio: 4
Triglycerides: 164 mg/dL — ABNORMAL HIGH (ref 0.0–149.0)
VLDL: 32.8 mg/dL (ref 0.0–40.0)

## 2016-12-07 LAB — CK: Total CK: 67 U/L (ref 7–177)

## 2016-12-07 LAB — HEPATIC FUNCTION PANEL
ALT: 34 U/L (ref 0–35)
AST: 25 U/L (ref 0–37)
Albumin: 4.1 g/dL (ref 3.5–5.2)
Alkaline Phosphatase: 68 U/L (ref 39–117)
Bilirubin, Direct: 0 mg/dL (ref 0.0–0.3)
Total Bilirubin: 0.2 mg/dL (ref 0.2–1.2)
Total Protein: 6.8 g/dL (ref 6.0–8.3)

## 2016-12-07 LAB — TSH: TSH: 2.02 u[IU]/mL (ref 0.35–4.50)

## 2016-12-12 ENCOUNTER — Other Ambulatory Visit: Payer: Managed Care, Other (non HMO)

## 2016-12-12 ENCOUNTER — Ambulatory Visit: Payer: Managed Care, Other (non HMO) | Admitting: Hematology & Oncology

## 2016-12-12 ENCOUNTER — Ambulatory Visit: Payer: Managed Care, Other (non HMO)

## 2016-12-15 ENCOUNTER — Ambulatory Visit
Admission: RE | Admit: 2016-12-15 | Discharge: 2016-12-15 | Disposition: A | Discharge status: Home / Self Care | Payer: Managed Care, Other (non HMO) | Source: Ambulatory Visit | Attending: Physical Medicine and Rehabilitation | Admitting: Physical Medicine and Rehabilitation

## 2016-12-15 DIAGNOSIS — M545 Low back pain: Secondary | ICD-10-CM

## 2016-12-17 ENCOUNTER — Ambulatory Visit: Payer: Managed Care, Other (non HMO)

## 2016-12-17 ENCOUNTER — Other Ambulatory Visit (HOSPITAL_BASED_OUTPATIENT_CLINIC_OR_DEPARTMENT_OTHER): Payer: Managed Care, Other (non HMO)

## 2016-12-17 ENCOUNTER — Ambulatory Visit (HOSPITAL_BASED_OUTPATIENT_CLINIC_OR_DEPARTMENT_OTHER): Payer: Managed Care, Other (non HMO) | Admitting: Hematology & Oncology

## 2016-12-17 ENCOUNTER — Other Ambulatory Visit: Payer: Managed Care, Other (non HMO)

## 2016-12-17 VITALS — BP 116/63 | HR 86 | Temp 98.0°F | Resp 17 | Wt 256.1 lb

## 2016-12-17 DIAGNOSIS — B9689 Other specified bacterial agents as the cause of diseases classified elsewhere: Secondary | ICD-10-CM

## 2016-12-17 DIAGNOSIS — D508 Other iron deficiency anemias: Secondary | ICD-10-CM

## 2016-12-17 DIAGNOSIS — D509 Iron deficiency anemia, unspecified: Secondary | ICD-10-CM | POA: Diagnosis not present

## 2016-12-17 DIAGNOSIS — J019 Acute sinusitis, unspecified: Principal | ICD-10-CM

## 2016-12-17 LAB — CBC WITH DIFFERENTIAL (CANCER CENTER ONLY)
BASO#: 0 10*3/uL (ref 0.0–0.2)
BASO%: 0.4 % (ref 0.0–2.0)
EOS%: 3.3 % (ref 0.0–7.0)
Eosinophils Absolute: 0.2 10*3/uL (ref 0.0–0.5)
HCT: 41 % (ref 34.8–46.6)
HGB: 13.5 g/dL (ref 11.6–15.9)
LYMPH#: 1.4 10*3/uL (ref 0.9–3.3)
LYMPH%: 20.3 % (ref 14.0–48.0)
MCH: 30.3 pg (ref 26.0–34.0)
MCHC: 32.9 g/dL (ref 32.0–36.0)
MCV: 92 fL (ref 81–101)
MONO#: 0.6 10*3/uL (ref 0.1–0.9)
MONO%: 8.5 % (ref 0.0–13.0)
NEUT#: 4.7 10*3/uL (ref 1.5–6.5)
NEUT%: 67.5 % (ref 39.6–80.0)
Platelets: 285 10*3/uL (ref 145–400)
RBC: 4.46 10*6/uL (ref 3.70–5.32)
RDW: 13.3 % (ref 11.1–15.7)
WBC: 7 10*3/uL (ref 3.9–10.0)

## 2016-12-17 LAB — CHCC SATELLITE - SMEAR

## 2016-12-17 NOTE — Progress Notes (Signed)
Hematology and Oncology Follow Up Visit  Olivia Mosley [id number] [birth year] 61 y.o. 12/17/2016   Principle Diagnosis:  Iron deficiency anemia  Current Therapy:   IV iron as indicated    Interim History: Olivia Mosley is here today for a follow-up.she is complaining of hair loss. Again, should Olivia Mosley why she is having the hair loss. I suppose this might be a reflection of low iron. It certainly is not common with low iron.  Last set we saw Olivia Mosley back in December, Olivia Mosley ferritin was 239 with iron saturation of 44%.  She is not chewing ice. She's had no rashes. She's had no fever. She's had no issues with influenza.  She's had no cough or shortness of breath. There's been no change in bowel or bladder habits.  Overall, Olivia Mosley performance status is ECOG 0.   Medications:  Allergies as of 12/17/2016      Reactions   Flagyl [metronidazole Hcl] Hives      Medication List       Accurate as of 12/17/16  2:49 PM. Always use your most recent med list.          ALPRAZolam 0.5 MG tablet Commonly known as:  XANAX Take 1 tablet (0.5 mg total) by mouth 2 (two) times daily as needed for anxiety.   Azelaic Acid 15 % cream Commonly known as:  FINACEA APPLY 1/2 GRAM TWICE DAILY   azelastine 0.1 % nasal spray Commonly known as:  ASTELIN USE 2 SPRAYS IN EACH NOSTRIL TWICE DAILY   carisoprodol 350 MG tablet Commonly known as:  SOMA   Cholecalciferol 2000 units Tabs Take 1 tablet (2,000 Units total) by mouth daily.   escitalopram 10 MG tablet Commonly known as:  LEXAPRO Take 1 tablet (10 mg total) by mouth daily.   esomeprazole 40 MG capsule Commonly known as:  NEXIUM TAKE 1 CAPSULE BY MOUTH DAILY (NEED TO SEE DR BEFORE MORE REFILLS)   levothyroxine 75 MCG tablet Commonly known as:  SYNTHROID, LEVOTHROID TAKE 1 TABLET BY MOUTH DAILY   meloxicam 15 MG tablet Commonly known as:  MOBIC   RESTASIS 0.05 % ophthalmic emulsion Generic drug:  cycloSPORINE   tiZANidine 4 MG tablet Commonly  known as:  ZANAFLEX   traZODone 50 MG tablet Commonly known as:  DESYREL TAKE 2 TABLETS BY MOUTH AT BEDTIME AS NEEDED FOR SLEEP   tretinoin 0.025 % cream Commonly known as:  RETIN-A Apply topically at bedtime.       Allergies:  Allergies  Allergen Reactions  . Flagyl [Metronidazole Hcl] Hives    Past Medical History, Surgical history, Social history, and Family History were reviewed and updated.  Review of Systems: All other 10 point review of systems is negative.   Physical Exam:  weight is 256 lb 1.9 oz (116.2 kg). Olivia Mosley oral temperature is 98 F (36.7 C). Olivia Mosley blood pressure is 116/63 and Olivia Mosley pulse is 86. Olivia Mosley respiration is 17 and oxygen saturation is 96%.   Wt Readings from Last 3 Encounters:  12/17/16 256 lb 1.9 oz (116.2 kg)  12/06/16 254 lb 1.3 oz (115.2 kg)  09/03/16 250 lb (113.4 kg)    Morbidly obese white female in no obvious distress. Head and neck exam shows no ocular or oral lesions. She has some tenderness over Olivia Mosley maxillary sinuses. She has some congestion in the nasal passages. There is no oral lesions. She has no adenopathy in the neck. Lungs are clear bilaterally. Cardiac exam regular rate and rhythm with no murmurs, rubs  or bruits. Abdomen is soft. She has good bowel sounds. She is obese. She has no fluid wave. There is no palpable liver or spleen tip. Back exam shows no tenderness over the spine, ribs or hips. Extremities shows no clubbing, cyanosis or edema. Olivia Mosley logical exam shows no focal neurological deficit. Skin exam shows scattered ecchymoses, mostly on Olivia Mosley arms.    Lab Results  Component Value Date   WBC 7.0 12/17/2016   HGB 13.5 12/17/2016   HCT 41.0 12/17/2016   MCV 92 12/17/2016   PLT 285 12/17/2016   Lab Results  Component Value Date   FERRITIN 239 09/03/2016   IRON 122 09/03/2016   TIBC 278 09/03/2016   UIBC 155 09/03/2016   IRONPCTSAT 44 09/03/2016   Lab Results  Component Value Date   RETICCTPCT 1.6 09/01/2015   RBC 4.46  12/17/2016   RETICCTABS 77.8 09/01/2015   No results found for: KPAFRELGTCHN, LAMBDASER, KAPLAMBRATIO No results found for: IGGSERUM, IGA, IGMSERUM No results found for: Odetta Pink, SPEI   Chemistry      Component Value Date/Time   NA 137 05/02/2016 1614   NA 138 09/01/2015 1117   K 4.8 05/02/2016 1614   K 4.4 09/01/2015 1117   CL 104 05/02/2016 1614   CO2 31 05/02/2016 1614   CO2 18 (L) 09/01/2015 1117   BUN 21 05/02/2016 1614   BUN 29.4 (H) 09/01/2015 1117   CREATININE 0.67 05/02/2016 1614   CREATININE 0.9 09/01/2015 1117      Component Value Date/Time   CALCIUM 9.6 05/02/2016 1614   CALCIUM 10.1 09/01/2015 1117   ALKPHOS 68 12/06/2016 1724   ALKPHOS 90 09/01/2015 1117   AST 25 12/06/2016 1724   AST 13 09/01/2015 1117   ALT 34 12/06/2016 1724   ALT 15 09/01/2015 1117   BILITOT 0.2 12/06/2016 1724   BILITOT 0.33 09/01/2015 1117     Impression and Plan: Ms. Carano is 61 yo female with iron deficiency. She feels that Olivia Mosley iron probably is low again. We will have to see what the iron studies show. I told Olivia Mosley that with the new insurance rules, we just cannot give Olivia Mosley iron without having the studies back.  If Olivia Mosley iron levels are too high for IV iron, then we will see about oral iron.  We will plan to get Olivia Mosley back to see [place] in another 2 or 3 months.    Volanda Napoleon, MD 3/26/20182:49 PM

## 2016-12-18 ENCOUNTER — Encounter: Payer: Self-pay | Admitting: *Deleted

## 2016-12-18 LAB — FERRITIN: Ferritin: 212 ng/ml (ref 9–269)

## 2016-12-18 LAB — RETICULOCYTES: Reticulocyte Count: 1.5 % (ref 0.6–2.6)

## 2016-12-18 LAB — IRON AND TIBC
%SAT: 31 % (ref 21–57)
Iron: 75 ug/dL (ref 41–142)
TIBC: 241 ug/dL (ref 236–444)
UIBC: 166 ug/dL (ref 120–384)

## 2016-12-18 MED ORDER — IRON POLYSACCH CMPLX-B12-FA 150-0.025-1 MG PO CAPS: 1.0000 | ORAL_CAPSULE | Freq: Every day | ORAL | 5 refills | Status: DC

## 2017-01-07 ENCOUNTER — Ambulatory Visit (INDEPENDENT_AMBULATORY_CARE_PROVIDER_SITE_OTHER): Payer: Managed Care, Other (non HMO) | Admitting: Orthopaedic Surgery

## 2017-01-07 ENCOUNTER — Encounter (INDEPENDENT_AMBULATORY_CARE_PROVIDER_SITE_OTHER): Payer: Self-pay | Admitting: Orthopaedic Surgery

## 2017-01-07 DIAGNOSIS — D179 Benign lipomatous neoplasm, unspecified: Secondary | ICD-10-CM

## 2017-01-07 NOTE — Progress Notes (Signed)
Office Visit Note   Patient: Olivia Mosley           Date of Birth: 08/14/1956           MRN: [id number] Visit Date: 01/07/2017              Requested by: Janith Lima, MD [address] [place], [place] [address] PCP: Scarlette Calico, MD   Assessment & Plan: Visit Diagnoses:  1. Multiple lipomas     Plan: Reassurance was given that there are no aggressive features and I find on exam. Not really able to appreciate much of the lipomas in her leg. I reassured her that this does not present like a DVT. I would not pursue advanced imaging on list this was bothering her significantly. I will plan on seeing her back as needed. Questions encouraged and answered.Total face to face encounter time was greater than 45 minutes and over half of this time was spent in counseling and/or coordination of care.  Follow-Up Instructions: Return if symptoms worsen or fail to improve.   Orders:  No orders of the defined types were placed in this encounter.  No orders of the defined types were placed in this encounter.     Procedures: No procedures performed   Clinical Data: No additional findings.   Subjective: Chief Complaint  Patient presents with  . Right Leg - Pain  . Right Forearm - Pain    Patient is a slightly anxious 61 year old female who comes in for evaluation of lipomas. She states that she has bilaterally with exertion. The pain does not radiate. She is not really able to reliably identified the lipomas in her calf. She is able to find the lipoma in her right forearm. She denies any constitutional symptoms. She denies any lower extremity pain and swelling redness shortness of breath or recent long bone injuries or surgeries. She does not have a history of a DVT.    Review of Systems  Constitutional: Negative.   HENT: Negative.   Eyes: Negative.   Respiratory: Negative.   Cardiovascular: Negative.   Endocrine: Negative.   Musculoskeletal: Negative.     Neurological: Negative.   Hematological: Negative.   Psychiatric/Behavioral: Negative.   All other systems reviewed and are negative.    Objective: Vital Signs: There were no vitals taken for this visit.  Physical Exam  Constitutional: She is oriented to person, place, and time. She appears well-developed and well-nourished.  HENT:  Head: Normocephalic and atraumatic.  Eyes: EOM are normal.  Neck: Neck supple.  Pulmonary/Chest: Effort normal.  Abdominal: Soft.  Neurological: She is alert and oriented to person, place, and time.  Skin: Skin is warm. Capillary refill takes less than 2 seconds.  Psychiatric: She has a normal mood and affect. Her behavior is normal. Judgment and thought content normal.  Nursing note and vitals reviewed.   Ortho Exam Right forearm and bilateral calf exam skin change shoes or signs of worrisome features or DVT. She does have a palpable small lipoma in the dorsum of her forearm. Specialty Comments:  No specialty comments available.  Imaging: No results found.   PMFS History: Patient Active Problem List   Diagnosis Date Noted  . IBS (irritable bowel syndrome) 05/03/2016  . Visit for screening mammogram 10/31/2015  . Allergic rhinitis due to animal hair and dander 04/20/2015  . Carpal tunnel syndrome 11/10/2014  . Subacute maxillary sinusitis 07/09/2014  . Gastroesophageal reflux disease without esophagitis 07/09/2014  . Vitamin  D deficiency 07/09/2014  . Anemia, iron deficiency 03/10/2013  . Back pain, chronic 03/10/2013  . Insomnia 03/14/2012  . Eczema 03/14/2012  . Routine health maintenance 03/14/2012  . Hyperglycemia 01/27/2012  . Hyperlipidemia with target LDL less than 130   . Hypothyroidism   . Depression with anxiety   . Combined fat and carbohydrate induced hyperlipemia 06/24/2009  . Cervical pain 06/02/2009  . Recurrent major depressive episodes ([place]) 09/29/2007   Past Medical History:  Diagnosis Date  . Allergy     SEASONAL  . Anemia   . Anxiety   . Arthritis    low back pain - MRI revealed DJD/DDD L3-4, L5-S1  . Chronic kidney disease    kidnetstone x2  . Depression 1980/1988   in-patient treatment   . Diabetes mellitus 2007  . Diverticulitis 1994  . GERD (gastroesophageal reflux disease)   . Hyperlipidemia   . Hypothyroidism     Family History  Problem Relation Age of Onset  . Depression Mother   . Stroke Mother   . COPD Mother   . Alcohol abuse Father   . Hyperlipidemia Father   . Heart disease Father   . Hypertension Father   . Breast cancer Sister   . Breast cancer Sister   . Kidney disease Sister   . Colon cancer Neg Hx     Past Surgical History:  Procedure Laterality Date  . APPENDECTOMY     1994  . BREAST SURGERY  2010   benign  . COLON SURGERY  1994   bowel resection   Social History   Occupational History  . legal assistant [employer]   Social History Main Topics  . Smoking status: Former Smoker    Packs/day: 0.25    Years: 26.00    Types: Cigarettes    Start date: 10/31/1975    Quit date: 10/30/2002  . Smokeless tobacco: Never Used     Comment: quit 11 years ago  . Alcohol use No     Comment: SOCIALLY  . Drug use: No  . Sexual activity: Not on file

## 2017-01-23 ENCOUNTER — Telehealth: Payer: Self-pay | Admitting: Internal Medicine

## 2017-01-23 MED ORDER — ESCITALOPRAM OXALATE 10 MG PO TABS: 10.0000 mg | ORAL_TABLET | Freq: Every day | ORAL | 0 refills | Status: DC

## 2017-01-23 NOTE — Telephone Encounter (Signed)
[facility] [place]. Last script was sent to [facility] in [place].

## 2017-01-23 NOTE — Telephone Encounter (Signed)
Patient states she just had lexapro refilled but accidentally threw it out in a grocery bag.  If Dr. Ronnald Ramp can do this she would like a [facility] [address] in [place].  Patient states she only has one more pill left for tomorrow.

## 2017-03-20 ENCOUNTER — Other Ambulatory Visit: Payer: Managed Care, Other (non HMO)

## 2017-03-20 ENCOUNTER — Ambulatory Visit: Payer: Managed Care, Other (non HMO) | Admitting: Family

## 2017-03-21 ENCOUNTER — Other Ambulatory Visit: Payer: Managed Care, Other (non HMO)

## 2017-03-21 ENCOUNTER — Ambulatory Visit: Payer: Managed Care, Other (non HMO) | Admitting: Family

## 2017-03-25 ENCOUNTER — Other Ambulatory Visit: Payer: Managed Care, Other (non HMO)

## 2017-03-25 ENCOUNTER — Ambulatory Visit: Payer: Managed Care, Other (non HMO) | Admitting: Family

## 2017-04-03 ENCOUNTER — Other Ambulatory Visit: Payer: Self-pay | Admitting: *Deleted

## 2017-04-03 DIAGNOSIS — D508 Other iron deficiency anemias: Secondary | ICD-10-CM

## 2017-04-03 MED ORDER — IRON POLYSACCH CMPLX-B12-FA 150-0.025-1 MG PO CAPS: 1.0000 | ORAL_CAPSULE | Freq: Every day | ORAL | 2 refills | Status: DC

## 2017-04-05 ENCOUNTER — Other Ambulatory Visit (HOSPITAL_BASED_OUTPATIENT_CLINIC_OR_DEPARTMENT_OTHER): Payer: Managed Care, Other (non HMO)

## 2017-04-05 ENCOUNTER — Ambulatory Visit (HOSPITAL_BASED_OUTPATIENT_CLINIC_OR_DEPARTMENT_OTHER): Payer: Managed Care, Other (non HMO) | Admitting: Family

## 2017-04-05 VITALS — BP 139/67 | HR 78 | Temp 98.3°F | Resp 18 | Wt 255.0 lb

## 2017-04-05 DIAGNOSIS — D509 Iron deficiency anemia, unspecified: Secondary | ICD-10-CM | POA: Diagnosis not present

## 2017-04-05 DIAGNOSIS — D508 Other iron deficiency anemias: Secondary | ICD-10-CM

## 2017-04-05 DIAGNOSIS — Z79899 Other long term (current) drug therapy: Secondary | ICD-10-CM | POA: Diagnosis not present

## 2017-04-05 LAB — CMP (CANCER CENTER ONLY)
ALT(SGPT): 35 U/L (ref 10–47)
AST: 28 U/L (ref 11–38)
Albumin: 3.5 g/dL (ref 3.3–5.5)
Alkaline Phosphatase: 64 U/L (ref 26–84)
BUN, Bld: 19 mg/dL (ref 7–22)
CO2: 27 mEq/L (ref 18–33)
Calcium: 9.4 mg/dL (ref 8.0–10.3)
Chloride: 106 mEq/L (ref 98–108)
Creat: 0.8 mg/dl (ref 0.6–1.2)
Glucose, Bld: 102 mg/dL (ref 73–118)
Potassium: 3.8 mEq/L (ref 3.3–4.7)
Sodium: 140 mEq/L (ref 128–145)
Total Bilirubin: 0.5 mg/dl (ref 0.20–1.60)
Total Protein: 6.7 g/dL (ref 6.4–8.1)

## 2017-04-05 LAB — CBC WITH DIFFERENTIAL (CANCER CENTER ONLY)
BASO#: 0 10*3/uL (ref 0.0–0.2)
BASO%: 0.5 % (ref 0.0–2.0)
EOS%: 3.8 % (ref 0.0–7.0)
Eosinophils Absolute: 0.2 10*3/uL (ref 0.0–0.5)
HCT: 40.4 % (ref 34.8–46.6)
HGB: 13.5 g/dL (ref 11.6–15.9)
LYMPH#: 1.2 10*3/uL (ref 0.9–3.3)
LYMPH%: 22.1 % (ref 14.0–48.0)
MCH: 30.1 pg (ref 26.0–34.0)
MCHC: 33.4 g/dL (ref 32.0–36.0)
MCV: 90 fL (ref 81–101)
MONO#: 0.5 10*3/uL (ref 0.1–0.9)
MONO%: 8.4 % (ref 0.0–13.0)
NEUT#: 3.6 10*3/uL (ref 1.5–6.5)
NEUT%: 65.2 % (ref 39.6–80.0)
Platelets: 274 10*3/uL (ref 145–400)
RBC: 4.49 10*6/uL (ref 3.70–5.32)
RDW: 13.9 % (ref 11.1–15.7)
WBC: 5.6 10*3/uL (ref 3.9–10.0)

## 2017-04-05 NOTE — Progress Notes (Signed)
Hematology and Oncology Follow Up Visit  Olivia Mosley [id number] 08/12/1956 61 y.o. 04/05/2017   Principle Diagnosis:  Iron deficiency anemia  Current Therapy:   Oral Iron supplement daily IV iron as indicated - last received in June 2017   Interim History:  Olivia Mosley is here today for follow-up. She is doing well on the oral iron supplement and has noticed that her hair has stopped falling out. Her iron saturation was 31% in March and ferritin 212. Hgb today is 13.5 with an MCV of 90.  No fever, chills, n/v, cough, rash, dizziness, SOB, chest pain, palpitations, abdominal pain or changes in bowel or bladder habits. She has IBS which will flare up from time to time.  No swelling, tenderness, numbness or tingling in her extremities. No c/o pain.  She has maintained a good appetite and is staying well hydrated. Her weight is stable.   ECOG Performance Status: 0 - Asymptomatic  Medications:  Allergies as of 04/05/2017      Reactions   Flagyl [metronidazole Hcl] Hives      Medication List       Accurate as of 04/05/17  3:11 PM. Always use your most recent med list.          ALPRAZolam 0.5 MG tablet Commonly known as:  XANAX Take 1 tablet (0.5 mg total) by mouth 2 (two) times daily as needed for anxiety.   Azelaic Acid 15 % cream Commonly known as:  FINACEA APPLY 1/2 GRAM TWICE DAILY   azelastine 0.1 % nasal spray Commonly known as:  ASTELIN USE 2 SPRAYS IN EACH NOSTRIL TWICE DAILY   carisoprodol 350 MG tablet Commonly known as:  SOMA   Cholecalciferol 2000 units Tabs Take 1 tablet (2,000 Units total) by mouth daily.   escitalopram 10 MG tablet Commonly known as:  LEXAPRO Take 1 tablet (10 mg total) by mouth daily.   esomeprazole 40 MG capsule Commonly known as:  NEXIUM TAKE 1 CAPSULE BY MOUTH DAILY (NEED TO SEE DR BEFORE MORE REFILLS)   Iron Polysacch Cmplx-B12-FA 150-0.025-1 MG Caps Take 1 tablet by mouth daily.   levothyroxine 75 MCG tablet Commonly known  as:  SYNTHROID, LEVOTHROID TAKE 1 TABLET BY MOUTH DAILY   meloxicam 15 MG tablet Commonly known as:  MOBIC   RESTASIS 0.05 % ophthalmic emulsion Generic drug:  cycloSPORINE   tiZANidine 4 MG tablet Commonly known as:  ZANAFLEX   traZODone 50 MG tablet Commonly known as:  DESYREL TAKE 2 TABLETS BY MOUTH AT BEDTIME AS NEEDED FOR SLEEP   tretinoin 0.025 % cream Commonly known as:  RETIN-A Apply topically at bedtime.       Allergies:  Allergies  Allergen Reactions  . Flagyl [Metronidazole Hcl] Hives    Past Medical History, Surgical history, Social history, and Family History were reviewed and updated.  Review of Systems: All other 10 point review of systems is negative.   Physical Exam:  weight is 255 lb (115.7 kg). Her oral temperature is 98.3 F (36.8 C). Her blood pressure is 139/67 and her pulse is 78. Her respiration is 18 and oxygen saturation is 96%.   Wt Readings from Last 3 Encounters:  04/05/17 255 lb (115.7 kg)  12/17/16 256 lb 1.9 oz (116.2 kg)  12/06/16 254 lb 1.3 oz (115.2 kg)    Ocular: Sclerae unicteric, pupils equal, round and reactive to light Ear-nose-throat: Oropharynx clear, dentition fair Lymphatic: No cervical, supraclavicular or axillary adenopathy Lungs no rales or rhonchi, good excursion bilaterally  Heart regular rate and rhythm, no murmur appreciated Abd soft, nontender, positive bowel sounds, no liver or spleen tip palpated on exam, no fluid wave  MSK no focal spinal tenderness, no joint edema Neuro: non-focal, well-oriented, appropriate affect Breasts: Deferred   Lab Results  Component Value Date   WBC 5.6 04/05/2017   HGB 13.5 04/05/2017   HCT 40.4 04/05/2017   MCV 90 04/05/2017   PLT 274 04/05/2017   Lab Results  Component Value Date   FERRITIN 212 12/17/2016   IRON 75 12/17/2016   TIBC 241 12/17/2016   UIBC 166 12/17/2016   IRONPCTSAT 31 12/17/2016   Lab Results  Component Value Date   RETICCTPCT 1.6 09/01/2015    RBC 4.49 04/05/2017   RETICCTABS 77.8 09/01/2015   No results found for: KPAFRELGTCHN, LAMBDASER, KAPLAMBRATIO No results found for: IGGSERUM, IGA, IGMSERUM No results found for: Odetta Pink, SPEI   Chemistry      Component Value Date/Time   NA 140 04/05/2017 1409   NA 138 09/01/2015 1117   K 3.8 04/05/2017 1409   K 4.4 09/01/2015 1117   CL 106 04/05/2017 1409   CO2 27 04/05/2017 1409   CO2 18 (L) 09/01/2015 1117   BUN 19 04/05/2017 1409   BUN 29.4 (H) 09/01/2015 1117   CREATININE 0.8 04/05/2017 1409   CREATININE 0.9 09/01/2015 1117      Component Value Date/Time   CALCIUM 9.4 04/05/2017 1409   CALCIUM 10.1 09/01/2015 1117   ALKPHOS 64 04/05/2017 1409   ALKPHOS 90 09/01/2015 1117   AST 28 04/05/2017 1409   AST 13 09/01/2015 1117   ALT 35 04/05/2017 1409   ALT 15 09/01/2015 1117   BILITOT 0.50 04/05/2017 1409   BILITOT 0.33 09/01/2015 1117      Impression and Plan: Olivia Mosley is a very pleasant 61 yo caucasian female with iron deficiency anemia. She has started taking an oral iron supplement and is doing quite well. She has no complaints at this time.  We will see what her iron studies show and bring her back in next week for infusion if needed.  We will go ahead and plan to see her back in 4 months for repeat lab work and follow-up.  She will contact our office with any questions or concerns. We can certainly see her sooner if need be.   Eliezer Bottom, NP 7/13/20183:11 PM

## 2017-04-08 LAB — TSH: TSH: 2.031 m(IU)/L (ref 0.308–3.960)

## 2017-04-08 LAB — IRON AND TIBC
%SAT: 36 % (ref 21–57)
Iron: 96 ug/dL (ref 41–142)
TIBC: 264 ug/dL (ref 236–444)
UIBC: 168 ug/dL (ref 120–384)

## 2017-04-08 LAB — FERRITIN: Ferritin: 222 ng/ml (ref 9–269)

## 2017-04-23 ENCOUNTER — Other Ambulatory Visit: Payer: Self-pay | Admitting: Internal Medicine

## 2017-04-25 ENCOUNTER — Telehealth: Payer: Self-pay

## 2017-04-25 MED ORDER — LEVOTHYROXINE SODIUM 75 MCG PO TABS: 75.0000 ug | ORAL_TABLET | Freq: Every day | ORAL | 1 refills | Status: DC

## 2017-04-25 NOTE — Telephone Encounter (Signed)
[facility] [address] [place] [facility] rf for levothyroxine 75 mcg. Labs and last OV verified.   Erx sent for 90 and 1 refill.

## 2017-07-08 LAB — HM MAMMOGRAPHY

## 2017-07-11 ENCOUNTER — Other Ambulatory Visit: Payer: Self-pay | Admitting: Internal Medicine

## 2017-07-11 DIAGNOSIS — J3081 Allergic rhinitis due to animal (cat) (dog) hair and dander: Secondary | ICD-10-CM

## 2017-07-15 ENCOUNTER — Encounter: Payer: Self-pay | Admitting: Internal Medicine

## 2017-07-21 ENCOUNTER — Other Ambulatory Visit: Payer: Self-pay | Admitting: Internal Medicine

## 2017-07-22 ENCOUNTER — Encounter: Payer: Self-pay | Admitting: Internal Medicine

## 2017-07-22 ENCOUNTER — Ambulatory Visit (INDEPENDENT_AMBULATORY_CARE_PROVIDER_SITE_OTHER): Payer: Managed Care, Other (non HMO) | Admitting: Internal Medicine

## 2017-07-22 ENCOUNTER — Other Ambulatory Visit (INDEPENDENT_AMBULATORY_CARE_PROVIDER_SITE_OTHER): Payer: Managed Care, Other (non HMO)

## 2017-07-22 VITALS — BP 132/66 | HR 98 | Temp 98.3°F | Resp 16 | Ht 63.5 in | Wt 250.0 lb

## 2017-07-22 DIAGNOSIS — R739 Hyperglycemia, unspecified: Secondary | ICD-10-CM

## 2017-07-22 DIAGNOSIS — R0609 Other forms of dyspnea: Secondary | ICD-10-CM

## 2017-07-22 DIAGNOSIS — E785 Hyperlipidemia, unspecified: Secondary | ICD-10-CM

## 2017-07-22 DIAGNOSIS — L309 Dermatitis, unspecified: Secondary | ICD-10-CM | POA: Diagnosis not present

## 2017-07-22 DIAGNOSIS — R0600 Dyspnea, unspecified: Secondary | ICD-10-CM

## 2017-07-22 LAB — CBC WITH DIFFERENTIAL/PLATELET
Basophils Absolute: 0.1 10*3/uL (ref 0.0–0.1)
Basophils Relative: 0.6 % (ref 0.0–3.0)
Eosinophils Absolute: 0.2 10*3/uL (ref 0.0–0.7)
Eosinophils Relative: 2.3 % (ref 0.0–5.0)
HCT: 40.9 % (ref 36.0–46.0)
Hemoglobin: 13.9 g/dL (ref 12.0–15.0)
Lymphocytes Relative: 18.1 % (ref 12.0–46.0)
Lymphs Abs: 1.5 10*3/uL (ref 0.7–4.0)
MCHC: 34 g/dL (ref 30.0–36.0)
MCV: 90.5 fl (ref 78.0–100.0)
Monocytes Absolute: 0.7 10*3/uL (ref 0.1–1.0)
Monocytes Relative: 8.6 % (ref 3.0–12.0)
Neutro Abs: 5.8 10*3/uL (ref 1.4–7.7)
Neutrophils Relative %: 70.4 % (ref 43.0–77.0)
Platelets: 310 10*3/uL (ref 150.0–400.0)
RBC: 4.52 Mil/uL (ref 3.87–5.11)
RDW: 14.1 % (ref 11.5–15.5)
WBC: 8.3 10*3/uL (ref 4.0–10.5)

## 2017-07-22 LAB — COMPREHENSIVE METABOLIC PANEL
ALT: 19 U/L (ref 0–35)
AST: 16 U/L (ref 0–37)
Albumin: 4.2 g/dL (ref 3.5–5.2)
Alkaline Phosphatase: 66 U/L (ref 39–117)
BUN: 21 mg/dL (ref 6–23)
CO2: 27 mEq/L (ref 19–32)
Calcium: 9.8 mg/dL (ref 8.4–10.5)
Chloride: 103 mEq/L (ref 96–112)
Creatinine, Ser: 0.66 mg/dL (ref 0.40–1.20)
GFR: 96.69 mL/min (ref >60.00)
Glucose, Bld: 93 mg/dL (ref 70–99)
Potassium: 4.5 mEq/L (ref 3.5–5.1)
Sodium: 138 mEq/L (ref 135–145)
Total Bilirubin: 0.2 mg/dL (ref 0.2–1.2)
Total Protein: 7 g/dL (ref 6.0–8.3)

## 2017-07-22 LAB — HEMOGLOBIN A1C: Hgb A1c MFr Bld: 5.9 % (ref 4.6–6.5)

## 2017-07-22 LAB — LIPID PANEL
Cholesterol: 240 mg/dL — ABNORMAL HIGH (ref 0–200)
HDL: 49.2 mg/dL (ref >39.00)
NonHDL: 190.93
Total CHOL/HDL Ratio: 5
Triglycerides: 202 mg/dL — ABNORMAL HIGH (ref 0.0–149.0)
VLDL: 40.4 mg/dL — ABNORMAL HIGH (ref 0.0–40.0)

## 2017-07-22 LAB — LDL CHOLESTEROL, DIRECT: Direct LDL: 170 mg/dL

## 2017-07-22 LAB — TSH: TSH: 1.96 u[IU]/mL (ref 0.35–4.50)

## 2017-07-22 MED ORDER — DOXEPIN HCL 10 MG PO CAPS: 10.0000 mg | ORAL_CAPSULE | Freq: Every day | ORAL | 1 refills | Status: DC

## 2017-07-22 MED ORDER — CRISABOROLE 2 % EX OINT: 1.0000 "application " | TOPICAL_OINTMENT | Freq: Two times a day (BID) | CUTANEOUS | 11 refills | Status: DC

## 2017-07-22 NOTE — Progress Notes (Signed)
Subjective:  Patient ID: Olivia Mosley, female    DOB: 1956-03-05  Age: 61 y.o. MRN: [id number]  CC: Rash   HPI Olivia Mosley presents for f/up - she complains of chronic, severely itchy rash on her wrists and hands.  She tells me this has been going on for several years.  She previously saw a dermatologist and tells me that a biopsy was positive for eczema.  Her prior dermatologist told her to use clobetasol. She used it for a while but then she stopped using it at the recommendation of her dermatologist because she said it was not working properly.  Most recently she has been covering these lesions with Vaseline but she says that is not helping.  She has not been taking anything by mouth to control the symptoms.  She also complains of a 53-month history of DOE and weight gain.  She denies chest pain, diaphoresis, palpitations, or edema.  She is concerned that she has blocked arteries around her heart.  Outpatient Medications Prior to Visit  Medication Sig Dispense Refill  . Azelaic Acid (FINACEA) 15 % cream APPLY 1/2 GRAM TWICE DAILY 50 g 11  . azelastine (ASTELIN) 0.1 % nasal spray USE 2 SPRAYS IN EACH NOSTRIL TWICE DAILY 30 mL 11  . Cholecalciferol 2000 UNITS TABS Take 1 tablet (2,000 Units total) by mouth daily. 90 tablet 3  . escitalopram (LEXAPRO) 10 MG tablet TAKE 1 TABLET BY MOUTH EVERY DAY 90 tablet 0  . Iron Polysacch Cmplx-B12-FA 150-0.025-1 MG CAPS Take 1 tablet by mouth daily. 90 each 2  . levothyroxine (SYNTHROID, LEVOTHROID) 75 MCG tablet Take 1 tablet (75 mcg total) by mouth daily. 90 tablet 1  . RESTASIS 0.05 % ophthalmic emulsion     . tiZANidine (ZANAFLEX) 4 MG tablet     . traZODone (DESYREL) 50 MG tablet TAKE 2 TABLETS BY MOUTH AT BEDTIME AS NEEDED FOR SLEEP 180 tablet 0  . tretinoin (RETIN-A) 0.025 % cream Apply topically at bedtime. 45 g 5  . ALPRAZolam (XANAX) 0.5 MG tablet Take 1 tablet (0.5 mg total) by mouth 2 (two) times daily as needed for anxiety. 60 tablet 3    . carisoprodol (SOMA) 350 MG tablet     . esomeprazole (NEXIUM) 40 MG capsule TAKE 1 CAPSULE BY MOUTH DAILY (NEED TO SEE DR BEFORE MORE REFILLS) 90 capsule 3  . meloxicam (MOBIC) 15 MG tablet      No facility-administered medications prior to visit.     ROS Review of Systems  Constitutional: Positive for unexpected weight change. Negative for appetite change, chills, diaphoresis and fatigue.  HENT: Negative.   Eyes: Negative.   Respiratory: Positive for shortness of breath. Negative for cough, chest tightness and wheezing.   Cardiovascular: Negative.  Negative for chest pain, palpitations and leg swelling.  Gastrointestinal: Negative.  Negative for abdominal pain, constipation, diarrhea, nausea and vomiting.  Endocrine: Negative.   Genitourinary: Negative.  Negative for decreased urine volume, difficulty urinating, dysuria and urgency.  Musculoskeletal: Negative.  Negative for back pain and myalgias.  Skin: Positive for rash. Negative for color change.  Allergic/Immunologic: Negative.   Neurological: Negative.  Negative for dizziness, facial asymmetry and weakness.  Hematological: Negative for adenopathy. Does not bruise/bleed easily.  Psychiatric/Behavioral: Negative.     Objective:  BP 132/66 (BP Location: Left Arm, Patient Position: Sitting, Cuff Size: Normal)   Pulse 98   Temp 98.3 F (36.8 C) (Oral)   Resp 16   Ht 5' 3.5" (1.613  m)   Wt 250 lb (113.4 kg)   SpO2 98%   BMI 43.59 kg/m   BP Readings from Last 3 Encounters:  07/22/17 132/66  04/05/17 139/67  12/17/16 116/63    Wt Readings from Last 3 Encounters:  07/22/17 250 lb (113.4 kg)  04/05/17 255 lb (115.7 kg)  12/17/16 256 lb 1.9 oz (116.2 kg)    Physical Exam  Constitutional: She is oriented to person, place, and time. No distress.  HENT:  Mouth/Throat: Oropharynx is clear and moist. No oropharyngeal exudate.  Eyes: Conjunctivae are normal. Right eye exhibits no discharge. Left eye exhibits no  discharge. No scleral icterus.  Neck: Normal range of motion. Neck supple. No JVD present. No thyromegaly present.  Cardiovascular: Normal rate, regular rhythm and intact distal pulses.  Exam reveals no gallop and no friction rub.   No murmur heard. EKG--  Sinus  Rhythm  WITHIN NORMAL LIMITS  Pulmonary/Chest: Effort normal and breath sounds normal. No respiratory distress. She has no wheezes. She has no rales. She exhibits no tenderness.  Abdominal: Soft. Bowel sounds are normal. She exhibits no distension and no mass. There is no tenderness. There is no rebound and no guarding.  Musculoskeletal: Normal range of motion. She exhibits no edema, tenderness or deformity.  Lymphadenopathy:    She has no cervical adenopathy.  Neurological: She is alert and oriented to person, place, and time.  Skin: Skin is warm and dry. Rash noted. She is not diaphoretic. No erythema. No pallor.  There are medium sized patches on the posterior aspect of the right elbow, dorsum of both hands and medial surface of both hands.  These areas show chronic eczematous inflammation with erythema, thickening, accentuated skin lines and lichenification  Vitals reviewed.   Lab Results  Component Value Date   WBC 8.3 07/22/2017   HGB 13.9 07/22/2017   HCT 40.9 07/22/2017   PLT 310.0 07/22/2017   GLUCOSE 93 07/22/2017   CHOL 240 (H) 07/22/2017   TRIG 202.0 (H) 07/22/2017   HDL 49.20 07/22/2017   LDLDIRECT 170.0 07/22/2017   LDLCALC 92 12/06/2016   ALT 19 07/22/2017   AST 16 07/22/2017   NA 138 07/22/2017   K 4.5 07/22/2017   CL 103 07/22/2017   CREATININE 0.66 07/22/2017   BUN 21 07/22/2017   CO2 27 07/22/2017   TSH 1.96 07/22/2017   INR 1.0 10/19/2015   HGBA1C 5.9 07/22/2017    Mr Lumbar Spine Wo Contrast  Result Date: 12/15/2016 CLINICAL DATA:  61 year old female with chronic lumbar back pain radiating to the left buttock and leg. Progressive symptoms for 2 years. Several falls but no recent injuries.  Recent spinal injection with little relief. EXAM: MRI LUMBAR SPINE WITHOUT CONTRAST TECHNIQUE: Multiplanar, multisequence MR imaging of the lumbar spine was performed. No intravenous contrast was administered. COMPARISON:  Lumbar MRI 05/03/2013 FINDINGS: Segmentation: Lumbar segmentation appears to be normal which is the same numbering system used on the 2014 comparison. Alignment: Mild anterolisthesis of L5 on S1 has not significantly changed. There is mild dextroconvex lumbar scoliosis which does appear increased. Mildly increased straightening of lumbar lordosis since 2014. Vertebrae: No marrow edema or evidence of acute osseous abnormality. Visualized bone marrow signal is within normal limits. Visible sacrum is intact. Conus medullaris: Extends to the L1 level and appears normal. Paraspinal and other soft tissues: Negative. Disc levels: T10-T11:  Mild disc bulge.  No definite stenosis. T11-T12: Mild disc bulge. Mild facet hypertrophy. Mild bilateral T11 foraminal stenosis appears chronic.  T12-L1:  Negative. L1-L2:  Negative. L2-L3: Interval disc desiccation, disc space loss, and circumferential disc bulge with broad-based posterior component (series 7, image 12). Mild facet hypertrophy greater on the left. No significant stenosis. L3-L4: Chronic disc space loss. Vacuum disc suspected. Mild circumferential disc bulge has not significantly changed. Mild facet hypertrophy is stable. Chronic endplate spurring primarily responsible for mild bilateral L3 foraminal stenosis which appears stable. L4-L5: Chronic disc space loss. Vacuum disc suspected. Chronic circumferential but mostly far lateral disc bulging is stable. Mild to moderate facet hypertrophy greater on the left is stable. Endplate spurring. No spinal or lateral recess stenosis. Stable mild left L4 foraminal stenosis. L5-S1: Chronic mild grade 1 anterolisthesis. Stable disc. Moderate to severe facet hypertrophy greater on the right appears stable. No spinal  or lateral recess stenosis. Moderate right L5 foraminal stenosis is stable. IMPRESSION: 1. Mild degeneration of the L2-L3 disc since 2014, but no associated stenosis. 2. Chronic disc degeneration at L3-L4 and L4-L5 with vacuum disc and endplate spurring. No associated spinal or lateral recess stenosis, while mild neural foraminal stenosis appears stable. 3. Chronic mild grade 1 anterolisthesis at L5-S1 with up to severe facet hypertrophy worse on the right. No spinal or lateral recess stenosis. Stable moderate right L5 foraminal stenosis. 4. Mild dextroconvex lumbar scoliosis appears increased. No acute osseous abnormality. Electronically Signed   By: Genevie Ann M.D.   On: 12/15/2016 20:43    Assessment & Plan:   Emmanuela was seen today for rash.  Diagnoses and all orders for this visit:  Eczema, unspecified type- Will try to control the itching with doxepin and will treat the rash with Eucrisa -     doxepin (SINEQUAN) 10 MG capsule; Take 1 capsule (10 mg total) by mouth at bedtime. -     Crisaborole (EUCRISA) 2 % OINT; Apply 1 application topically 2 (two) times daily.  DOE (dyspnea on exertion)- her EKG is normal.  She cannot exercise so we will not do a treadmill.  We will schedule a myocardial perfusion imaging.  If that is negative for ischemia then we will consider other options such as obesity and deconditioning. -     EKG 12-Lead -     CBC with Differential/Platelet; Future -     MYOCARDIAL PERFUSION IMAGING; Future  Hyperlipidemia with target LDL less than 130- her ASCVD risk score is not significantly elevated, therefore I do not recommend that she start a statin for CV risk reduction. -     Lipid panel; Future -     TSH; Future  Hyperglycemia- her A1c is at 5.9%.  She is mildly prediabetic.  Medical therapy is not indicated.  She agrees to work on her lifestyle modifications. -     Comprehensive metabolic panel; Future -     Hemoglobin A1c; Future   I have discontinued Ms. Dunphy  esomeprazole, ALPRAZolam, carisoprodol, and meloxicam. I am also having her start on doxepin and Crisaborole. Additionally, I am having her maintain her Cholecalciferol, Azelaic Acid, tretinoin, RESTASIS, tiZANidine, Iron Polysacch Cmplx-B12-FA, escitalopram, levothyroxine, azelastine, traZODone, ranitidine, and aspirin.  Meds ordered this encounter  Medications  . ranitidine (ZANTAC 75) 75 MG tablet    Sig: Take 75 mg by mouth 2 (two) times daily.  Marland Kitchen aspirin 81 MG tablet    Sig: Take 81 mg by mouth daily.  Marland Kitchen doxepin (SINEQUAN) 10 MG capsule    Sig: Take 1 capsule (10 mg total) by mouth at bedtime.    Dispense:  90 capsule  Refill:  1  . Crisaborole (EUCRISA) 2 % OINT    Sig: Apply 1 application topically 2 (two) times daily.    Dispense:  60 g    Refill:  11     Follow-up: Return in about 3 months (around 10/22/2017).  Scarlette Calico, MD

## 2017-07-22 NOTE — Patient Instructions (Signed)

## 2017-07-23 ENCOUNTER — Telehealth: Payer: Self-pay | Admitting: Podiatry

## 2017-07-23 ENCOUNTER — Encounter: Payer: Self-pay | Admitting: Internal Medicine

## 2017-07-23 LAB — HM MAMMOGRAPHY

## 2017-07-23 NOTE — Telephone Encounter (Signed)
I was calling to obtain a CD of the x-rays of the bunion under my little toe right foot. I'm not interested in the one at my big toe, just of the one below my right little toe. Its probably the last one taken. I live in [place] and am seeing a foot doctor in [place] who wants to see the x-ray. You can call me back at [phone].

## 2017-07-24 ENCOUNTER — Telehealth: Payer: Self-pay | Admitting: Podiatry

## 2017-07-24 ENCOUNTER — Encounter: Payer: Self-pay | Admitting: *Deleted

## 2017-07-24 NOTE — Telephone Encounter (Signed)
Called pt and left voicemail letting her know that I can get her a copy of her x-rays on her right foot to a CD but that it's of the whole foot and not a specific area. Explained there is a $5.00 fee and she would need to sign a medical records release form. Told pt to call me back at [phone].

## 2017-07-25 NOTE — Telephone Encounter (Signed)
Called pt and spoke with her in regards to her request for her x-rays to a CD. Pt requested I e-mail her the release form and she will have her husband print it out at work and bring home to her so she can physically fill it out and sign it. She will then get it scanned and emailed back to me. Asked if she could mail [place] a check for the $5.00 fee which I told her she could. She asked who to make the check out to as well as the mailing address. She also asked if she could make it to my attention on the envelope when mailing her check and I told her to put attention Janett Billow S on the envelope. Pt requested all x-rays of both right and left foot be burned to a CD and either they March or May images also be printed out and mailed to her to take to her new podiatrist. I told pt I would get the release form emailed out to her today and once I get the signed form back I would start working on the burning of the x-rays to a CD.

## 2017-07-25 NOTE — Telephone Encounter (Signed)
I'm returning the phone call to [place] in Records. I wanted to speak to you about the logistics of getting the release form and the $5.00 fee to you in regards to the images. I was hoping I could get the release form emailed then mail you a check for the fee and once you got that, you could send the CD of x-rays. I also have a question about the images. Please call me back at [phone]. Thank you.

## 2017-07-29 ENCOUNTER — Encounter: Payer: Self-pay | Admitting: Internal Medicine

## 2017-07-29 ENCOUNTER — Telehealth (HOSPITAL_COMMUNITY): Payer: Self-pay | Admitting: Internal Medicine

## 2017-07-30 NOTE — Telephone Encounter (Signed)
User: Cherie Dark A Date/time: 07/29/17 11:15 AM  Comment: Called pt and lmsg for her to CB to get scheduled for a myoview.   Context:  Outcome: Left Message  Phone number: [phone] Phone Type: Home Phone  Comm. type: Telephone Call type: Outgoing  Contact: Velora Heckler P Relation to patient: Self  Patient Demographics for

## 2017-07-31 ENCOUNTER — Telehealth (HOSPITAL_COMMUNITY): Payer: Self-pay | Admitting: *Deleted

## 2017-07-31 NOTE — Telephone Encounter (Signed)
Left message on voicemail in reference to upcoming appointment scheduled for 08/06/17 Phone number given for a call back so details instructions can be given. Olivia Mosley

## 2017-08-02 NOTE — Telephone Encounter (Signed)
Hi Olivia Mosley, I spoke to you one day last week and you were supposed to e-mail me a copy of the release form for me to fill out and sign to send back to you. Yes ma'am, I did e-mail it to you that afternoon after we spoke. Hmm, I never got it. Did you check your spam and or junk e-mail folder to see if it went into there. Its not in my spam folder. Where are you sending it from. I'm sending it from my work e-mail and I proceeded to tell her my work [employer]. I stayed on the phone with the pt to send a new one after confirming her e-mail address again. I sent it to her with a delivery receipt to which I told her it was sent to her e-mail. Pt stated she hopes that she gets it soon.

## 2017-08-06 ENCOUNTER — Ambulatory Visit (HOSPITAL_COMMUNITY): Payer: Managed Care, Other (non HMO) | Attending: Cardiology

## 2017-08-06 DIAGNOSIS — R0600 Dyspnea, unspecified: Secondary | ICD-10-CM

## 2017-08-06 DIAGNOSIS — R0609 Other forms of dyspnea: Secondary | ICD-10-CM | POA: Insufficient documentation

## 2017-08-06 MED ORDER — TECHNETIUM TC 99M TETROFOSMIN IV KIT
30.7000 | PACK | Freq: Once | INTRAVENOUS | Status: AC | PRN
  Administered 2017-08-06: 30.7 via INTRAVENOUS
  Filled 2017-08-06: qty 31

## 2017-08-06 MED ORDER — REGADENOSON 0.4 MG/5ML IV SOLN
0.4000 mg | Freq: Once | INTRAVENOUS | Status: AC
  Administered 2017-08-06: 0.4 mg via INTRAVENOUS

## 2017-08-07 ENCOUNTER — Ambulatory Visit (HOSPITAL_COMMUNITY): Payer: Managed Care, Other (non HMO) | Attending: Cardiology

## 2017-08-07 LAB — MYOCARDIAL PERFUSION IMAGING
LV dias vol: 75 mL (ref 46–106)
LV sys vol: 26 mL
Peak HR: 97 {beats}/min
RATE: 0.33
Rest HR: 69 {beats}/min
SDS: 1
SRS: 4
SSS: 5
TID: 0.91

## 2017-08-07 MED ORDER — TECHNETIUM TC 99M TETROFOSMIN IV KIT
30.4000 | PACK | Freq: Once | INTRAVENOUS | Status: AC | PRN
  Administered 2017-08-07: 30.4 via INTRAVENOUS
  Filled 2017-08-07: qty 31

## 2017-08-08 ENCOUNTER — Encounter: Payer: Self-pay | Admitting: Internal Medicine

## 2017-09-13 ENCOUNTER — Ambulatory Visit (HOSPITAL_BASED_OUTPATIENT_CLINIC_OR_DEPARTMENT_OTHER): Payer: Managed Care, Other (non HMO) | Admitting: Hematology & Oncology

## 2017-09-13 ENCOUNTER — Other Ambulatory Visit: Payer: Self-pay

## 2017-09-13 ENCOUNTER — Other Ambulatory Visit (HOSPITAL_BASED_OUTPATIENT_CLINIC_OR_DEPARTMENT_OTHER): Payer: Managed Care, Other (non HMO)

## 2017-09-13 ENCOUNTER — Encounter: Payer: Self-pay | Admitting: Hematology & Oncology

## 2017-09-13 VITALS — BP 146/74 | HR 73 | Temp 98.3°F | Resp 19 | Wt 245.0 lb

## 2017-09-13 DIAGNOSIS — D509 Iron deficiency anemia, unspecified: Secondary | ICD-10-CM

## 2017-09-13 DIAGNOSIS — D508 Other iron deficiency anemias: Secondary | ICD-10-CM

## 2017-09-13 DIAGNOSIS — D500 Iron deficiency anemia secondary to blood loss (chronic): Secondary | ICD-10-CM

## 2017-09-13 LAB — CBC WITH DIFFERENTIAL (CANCER CENTER ONLY)
BASO#: 0 10*3/uL (ref 0.0–0.2)
BASO%: 0.5 % (ref 0.0–2.0)
EOS%: 3.7 % (ref 0.0–7.0)
Eosinophils Absolute: 0.2 10*3/uL (ref 0.0–0.5)
HCT: 39.3 % (ref 34.8–46.6)
HGB: 13.3 g/dL (ref 11.6–15.9)
LYMPH#: 1.2 10*3/uL (ref 0.9–3.3)
LYMPH%: 19.1 % (ref 14.0–48.0)
MCH: 30.5 pg (ref 26.0–34.0)
MCHC: 33.8 g/dL (ref 32.0–36.0)
MCV: 90 fL (ref 81–101)
MONO#: 0.6 10*3/uL (ref 0.1–0.9)
MONO%: 8.6 % (ref 0.0–13.0)
NEUT#: 4.4 10*3/uL (ref 1.5–6.5)
NEUT%: 68.1 % (ref 39.6–80.0)
Platelets: 305 10*3/uL (ref 145–400)
RBC: 4.36 10*6/uL (ref 3.70–5.32)
RDW: 13.8 % (ref 11.1–15.7)
WBC: 6.5 10*3/uL (ref 3.9–10.0)

## 2017-09-13 LAB — CMP (CANCER CENTER ONLY)
ALT(SGPT): 31 U/L (ref 10–47)
AST: 24 U/L (ref 11–38)
Albumin: 3.7 g/dL (ref 3.3–5.5)
Alkaline Phosphatase: 70 U/L (ref 26–84)
BUN, Bld: 17 mg/dL (ref 7–22)
CO2: 27 mEq/L (ref 18–33)
Calcium: 9.6 mg/dL (ref 8.0–10.3)
Chloride: 104 mEq/L (ref 98–108)
Creat: 0.7 mg/dl (ref 0.6–1.2)
Glucose, Bld: 105 mg/dL (ref 73–118)
Potassium: 4.2 mEq/L (ref 3.3–4.7)
Sodium: 140 mEq/L (ref 128–145)
Total Bilirubin: 0.6 mg/dl (ref 0.20–1.60)
Total Protein: 6.9 g/dL (ref 6.4–8.1)

## 2017-09-13 LAB — IRON AND TIBC
%SAT: 35 % (ref 21–57)
Iron: 85 ug/dL (ref 41–142)
TIBC: 246 ug/dL (ref 236–444)
UIBC: 161 ug/dL (ref 120–384)

## 2017-09-13 LAB — FERRITIN: Ferritin: 256 ng/ml (ref 9–269)

## 2017-09-13 NOTE — Progress Notes (Signed)
Hematology and Oncology Follow Up Visit  JAILEE JAQUEZ [id number] [birth year] 61 y.o. 09/13/2017   Principle Diagnosis:  Iron deficiency anemia  Current Therapy:   Oral Iron supplement daily IV iron as indicated - last received in June 2017   Interim History:  Ms. Mcelhinny is here today for follow-up.  She is doing okay.  She wishes that she could lose some more weight.  She has had no problems with bleeding.  Her last iron studies done back in July showed a ferritin of 222 with an iron saturation of 36%.    She had a nice Thanksgiving.  She is not sure what she will do for Christmas.    She is having no problems taking the oral iron.  There is been no constipation.  She had no abdominal cramps.    ECOG Performance Status: 0 - Asymptomatic  Medications:  Allergies as of 09/13/2017      Reactions   Flagyl [metronidazole Hcl] Hives      Medication List        Accurate as of 09/13/17 12:20 PM. Always use your most recent med list.          Azelaic Acid 15 % cream Commonly known as:  FINACEA APPLY 1/2 GRAM TWICE DAILY   azelastine 0.1 % nasal spray Commonly known as:  ASTELIN USE 2 SPRAYS IN EACH NOSTRIL TWICE DAILY   Cholecalciferol 2000 units Tabs Take 1 tablet (2,000 Units total) by mouth daily.   Crisaborole 2 % Oint Commonly known as:  EUCRISA Apply 1 application topically 2 (two) times daily.   doxepin 10 MG capsule Commonly known as:  SINEQUAN Take 1 capsule (10 mg total) by mouth at bedtime.   escitalopram 10 MG tablet Commonly known as:  LEXAPRO TAKE 1 TABLET BY MOUTH EVERY DAY   Iron Polysacch Cmplx-B12-FA 150-0.025-1 MG Caps Take 1 tablet by mouth daily.   levothyroxine 75 MCG tablet Commonly known as:  SYNTHROID, LEVOTHROID Take 1 tablet (75 mcg total) by mouth daily.   RESTASIS 0.05 % ophthalmic emulsion Generic drug:  cycloSPORINE   tiZANidine 4 MG tablet Commonly known as:  ZANAFLEX   traZODone 50 MG tablet Commonly known as:   DESYREL TAKE 2 TABLETS BY MOUTH AT BEDTIME AS NEEDED FOR SLEEP   tretinoin 0.025 % cream Commonly known as:  RETIN-A Apply topically at bedtime.   ZANTAC 75 75 MG tablet Generic drug:  ranitidine Take 75 mg by mouth 2 (two) times daily.       Allergies:  Allergies  Allergen Reactions  . Flagyl [Metronidazole Hcl] Hives    Past Medical History, Surgical history, Social history, and Family History were reviewed and updated.  Review of Systems: Review of Systems  All other systems reviewed and are negative.   Physical Exam:  weight is 245 lb (111.1 kg). Her oral temperature is 98.3 F (36.8 C). Her blood pressure is 146/74 (abnormal) and her pulse is 73. Her respiration is 19 and oxygen saturation is 98%.   Wt Readings from Last 3 Encounters:  09/13/17 245 lb (111.1 kg)  08/06/17 250 lb (113.4 kg)  07/22/17 250 lb (113.4 kg)    Physical Exam  Constitutional: She is oriented to person, place, and time.  HENT:  Head: Normocephalic and atraumatic.  Mouth/Throat: Oropharynx is clear and moist.  Eyes: EOM are normal. Pupils are equal, round, and reactive to light.  Neck: Normal range of motion.  Cardiovascular: Normal rate, regular rhythm and normal heart sounds.  Pulmonary/Chest: Effort normal and breath sounds normal.  Abdominal: Soft. Bowel sounds are normal.  Musculoskeletal: Normal range of motion. She exhibits no edema, tenderness or deformity.  Lymphadenopathy:    She has no cervical adenopathy.  Neurological: She is alert and oriented to person, place, and time.  Skin: Skin is warm and dry. No rash noted. No erythema.  Psychiatric: She has a normal mood and affect. Her behavior is normal. Judgment and thought content normal.  Vitals reviewed.    Lab Results  Component Value Date   WBC 6.5 09/13/2017   HGB 13.3 09/13/2017   HCT 39.3 09/13/2017   MCV 90 09/13/2017   PLT 305 09/13/2017   Lab Results  Component Value Date   FERRITIN 222 04/05/2017    IRON 96 04/05/2017   TIBC 264 04/05/2017   UIBC 168 04/05/2017   IRONPCTSAT 36 04/05/2017   Lab Results  Component Value Date   RETICCTPCT 1.6 09/01/2015   RBC 4.36 09/13/2017   RETICCTABS 77.8 09/01/2015   No results found for: KPAFRELGTCHN, LAMBDASER, KAPLAMBRATIO No results found for: IGGSERUM, IGA, IGMSERUM No results found for: Odetta Pink, SPEI   Chemistry      Component Value Date/Time   NA 140 09/13/2017 1125   NA 138 09/01/2015 1117   K 4.2 09/13/2017 1125   K 4.4 09/01/2015 1117   CL 104 09/13/2017 1125   CO2 27 09/13/2017 1125   CO2 18 (L) 09/01/2015 1117   BUN 17 09/13/2017 1125   BUN 29.4 (H) 09/01/2015 1117   CREATININE 0.7 09/13/2017 1125   CREATININE 0.9 09/01/2015 1117      Component Value Date/Time   CALCIUM 9.6 09/13/2017 1125   CALCIUM 10.1 09/01/2015 1117   ALKPHOS 70 09/13/2017 1125   ALKPHOS 90 09/01/2015 1117   AST 24 09/13/2017 1125   AST 13 09/01/2015 1117   ALT 31 09/13/2017 1125   ALT 15 09/01/2015 1117   BILITOT 0.60 09/13/2017 1125   BILITOT 0.33 09/01/2015 1117      Impression and Plan: Ms. Polyakov is a very pleasant 61 yo caucasian female with iron deficiency anemia. She has started taking an oral iron supplement and is doing quite well. She has no complaints at this time.   We will see what her iron studies show and bring her back in next week for infusion if needed.   We will go ahead and plan to see her back in 4 months for repeat lab work and follow-up.   She will contact our office with any questions or concerns. We can certainly see her sooner if need be.   Volanda Napoleon, MD 12/21/201812:20 PM

## 2017-10-03 ENCOUNTER — Encounter: Payer: Self-pay | Admitting: Internal Medicine

## 2017-10-05 ENCOUNTER — Other Ambulatory Visit: Payer: Self-pay | Admitting: Internal Medicine

## 2017-10-11 ENCOUNTER — Other Ambulatory Visit: Payer: Self-pay | Admitting: Internal Medicine

## 2017-10-17 ENCOUNTER — Other Ambulatory Visit: Payer: Self-pay | Admitting: Internal Medicine

## 2017-10-22 ENCOUNTER — Encounter: Payer: Managed Care, Other (non HMO) | Admitting: Internal Medicine

## 2017-10-22 ENCOUNTER — Other Ambulatory Visit: Payer: Self-pay | Admitting: Internal Medicine

## 2017-11-14 ENCOUNTER — Encounter: Payer: Managed Care, Other (non HMO) | Admitting: Internal Medicine

## 2017-12-03 ENCOUNTER — Other Ambulatory Visit: Payer: Managed Care, Other (non HMO)

## 2017-12-03 ENCOUNTER — Encounter: Payer: Self-pay | Admitting: Internal Medicine

## 2017-12-03 ENCOUNTER — Other Ambulatory Visit (INDEPENDENT_AMBULATORY_CARE_PROVIDER_SITE_OTHER): Payer: Managed Care, Other (non HMO)

## 2017-12-03 ENCOUNTER — Other Ambulatory Visit (HOSPITAL_COMMUNITY)
Admission: RE | Admit: 2017-12-03 | Discharge: 2017-12-03 | Disposition: A | Discharge status: Home / Self Care | Payer: Managed Care, Other (non HMO) | Source: Ambulatory Visit | Attending: Internal Medicine | Admitting: Internal Medicine

## 2017-12-03 ENCOUNTER — Ambulatory Visit (INDEPENDENT_AMBULATORY_CARE_PROVIDER_SITE_OTHER): Payer: Managed Care, Other (non HMO) | Admitting: Internal Medicine

## 2017-12-03 VITALS — BP 106/68 | HR 94 | Temp 98.6°F | Resp 16 | Ht 63.0 in | Wt 243.1 lb

## 2017-12-03 DIAGNOSIS — E559 Vitamin D deficiency, unspecified: Secondary | ICD-10-CM

## 2017-12-03 DIAGNOSIS — Z124 Encounter for screening for malignant neoplasm of cervix: Secondary | ICD-10-CM | POA: Insufficient documentation

## 2017-12-03 DIAGNOSIS — D508 Other iron deficiency anemias: Secondary | ICD-10-CM

## 2017-12-03 DIAGNOSIS — E039 Hypothyroidism, unspecified: Secondary | ICD-10-CM | POA: Diagnosis not present

## 2017-12-03 DIAGNOSIS — R739 Hyperglycemia, unspecified: Secondary | ICD-10-CM

## 2017-12-03 DIAGNOSIS — Z1231 Encounter for screening mammogram for malignant neoplasm of breast: Secondary | ICD-10-CM

## 2017-12-03 DIAGNOSIS — E785 Hyperlipidemia, unspecified: Secondary | ICD-10-CM

## 2017-12-03 DIAGNOSIS — M1000 Idiopathic gout, unspecified site: Secondary | ICD-10-CM

## 2017-12-03 DIAGNOSIS — E2839 Other primary ovarian failure: Secondary | ICD-10-CM

## 2017-12-03 DIAGNOSIS — Z0000 Encounter for general adult medical examination without abnormal findings: Secondary | ICD-10-CM | POA: Diagnosis not present

## 2017-12-03 LAB — CBC WITH DIFFERENTIAL/PLATELET
Basophils Absolute: 0.1 10*3/uL (ref 0.0–0.1)
Basophils Relative: 0.8 % (ref 0.0–3.0)
Eosinophils Absolute: 0.3 10*3/uL (ref 0.0–0.7)
Eosinophils Relative: 3.4 % (ref 0.0–5.0)
HCT: 39.7 % (ref 36.0–46.0)
Hemoglobin: 13.3 g/dL (ref 12.0–15.0)
Lymphocytes Relative: 20.2 % (ref 12.0–46.0)
Lymphs Abs: 1.5 10*3/uL (ref 0.7–4.0)
MCHC: 33.5 g/dL (ref 30.0–36.0)
MCV: 90.5 fl (ref 78.0–100.0)
Monocytes Absolute: 0.8 10*3/uL (ref 0.1–1.0)
Monocytes Relative: 10.3 % (ref 3.0–12.0)
Neutro Abs: 4.8 10*3/uL (ref 1.4–7.7)
Neutrophils Relative %: 65.3 % (ref 43.0–77.0)
Platelets: 292 10*3/uL (ref 150.0–400.0)
RBC: 4.39 Mil/uL (ref 3.87–5.11)
RDW: 13.2 % (ref 11.5–15.5)
WBC: 7.3 10*3/uL (ref 4.0–10.5)

## 2017-12-03 LAB — COMPREHENSIVE METABOLIC PANEL
ALT: 19 U/L (ref 0–35)
AST: 14 U/L (ref 0–37)
Albumin: 3.9 g/dL (ref 3.5–5.2)
Alkaline Phosphatase: 55 U/L (ref 39–117)
BUN: 14 mg/dL (ref 6–23)
CO2: 26 mEq/L (ref 19–32)
Calcium: 9.6 mg/dL (ref 8.4–10.5)
Chloride: 106 mEq/L (ref 96–112)
Creatinine, Ser: 0.64 mg/dL (ref 0.40–1.20)
GFR: 100.06 mL/min (ref >60.00)
Glucose, Bld: 102 mg/dL — ABNORMAL HIGH (ref 70–99)
Potassium: 4.3 mEq/L (ref 3.5–5.1)
Sodium: 139 mEq/L (ref 135–145)
Total Bilirubin: 0.2 mg/dL (ref 0.2–1.2)
Total Protein: 6.6 g/dL (ref 6.0–8.3)

## 2017-12-03 LAB — TSH: TSH: 1.8 u[IU]/mL (ref 0.35–4.50)

## 2017-12-03 LAB — LIPID PANEL
Cholesterol: 216 mg/dL — ABNORMAL HIGH (ref 0–200)
HDL: 45.1 mg/dL (ref >39.00)
LDL Cholesterol: 140 mg/dL — ABNORMAL HIGH (ref 0–99)
NonHDL: 171.08
Total CHOL/HDL Ratio: 5
Triglycerides: 157 mg/dL — ABNORMAL HIGH (ref 0.0–149.0)
VLDL: 31.4 mg/dL (ref 0.0–40.0)

## 2017-12-03 LAB — HEMOGLOBIN A1C: Hgb A1c MFr Bld: 5.7 % (ref 4.6–6.5)

## 2017-12-03 LAB — FERRITIN: Ferritin: 243.1 ng/mL (ref 10.0–291.0)

## 2017-12-03 LAB — HM PAP SMEAR

## 2017-12-03 LAB — IBC PANEL
Iron: 49 ug/dL (ref 42–145)
Saturation Ratios: 17 % — ABNORMAL LOW (ref 20.0–50.0)
Transferrin: 206 mg/dL — ABNORMAL LOW (ref 212.0–360.0)

## 2017-12-03 LAB — VITAMIN D 25 HYDROXY (VIT D DEFICIENCY, FRACTURES): VITD: 28.33 ng/mL — ABNORMAL LOW (ref 30.00–100.00)

## 2017-12-03 LAB — URIC ACID: Uric Acid, Serum: 3.7 mg/dL (ref 2.4–7.0)

## 2017-12-03 NOTE — Progress Notes (Signed)
Subjective:  Patient ID: Olivia Mosley, female    DOB: [birth year]  Age: 62 y.o. MRN: [id number]  CC: Hypothyroidism; Annual Exam; and Anemia   HPI Olivia Mosley presents for a CPX.  She recently experienced some right foot pain and saw an orthopedist who told her to have auric acid level done.  She has been able to lose 7 pounds over the last 4 months.  She feels like her thyroid dose is adequate as she denies edema, constipation, fatigue, or palpitations.  Outpatient Medications Prior to Visit  Medication Sig Dispense Refill  . ALPRAZolam (XANAX) 0.5 MG tablet TAKE 1 TABLET BY MOUTH 2 TIMES A DAY AS NEEDED FOR ANXIETY 60 tablet 0  . Azelaic Acid (FINACEA) 15 % cream APPLY 1/2 GRAM TWICE DAILY 50 g 11  . azelastine (ASTELIN) 0.1 % nasal spray USE 2 SPRAYS IN EACH NOSTRIL TWICE DAILY 30 mL 11  . clobetasol cream (TEMOVATE) 0.05 % APPLY TOPICALLY 2 (TWO) TIMES DAILY. 30 g 1  . Crisaborole (EUCRISA) 2 % OINT Apply 1 application topically 2 (two) times daily. 60 g 11  . doxepin (SINEQUAN) 10 MG capsule Take 1 capsule (10 mg total) by mouth at bedtime. 90 capsule 1  . escitalopram (LEXAPRO) 10 MG tablet TAKE 1 TABLET BY MOUTH EVERY DAY 90 tablet 0  . Iron Polysacch Cmplx-B12-FA 150-0.025-1 MG CAPS Take 1 tablet by mouth daily. 90 each 2  . ranitidine (ZANTAC 75) 75 MG tablet Take 75 mg by mouth 2 (two) times daily.    . RESTASIS 0.05 % ophthalmic emulsion     . tiZANidine (ZANAFLEX) 4 MG tablet     . traZODone (DESYREL) 50 MG tablet TAKE 2 TABLETS BY MOUTH AT BEDTIME AS NEEDED FOR SLEEP 180 tablet 0  . tretinoin (RETIN-A) 0.025 % cream Apply topically at bedtime. 45 g 5  . Cholecalciferol 2000 UNITS TABS Take 1 tablet (2,000 Units total) by mouth daily. 90 tablet 3  . levothyroxine (SYNTHROID, LEVOTHROID) 75 MCG tablet TAKE 1 TABLET BY MOUTH EVERY DAY 90 tablet 0   No facility-administered medications prior to visit.     ROS Review of Systems  Constitutional: Negative.  Negative for  appetite change, diaphoresis, fatigue and unexpected weight change.  HENT: Negative.  Negative for sinus pressure, sore throat and trouble swallowing.   Eyes: Negative for visual disturbance.  Respiratory: Negative for cough, chest tightness, shortness of breath and wheezing.   Cardiovascular: Negative for chest pain, palpitations and leg swelling.  Gastrointestinal: Negative for abdominal pain, constipation, diarrhea, nausea and vomiting.  Endocrine: Negative.  Negative for cold intolerance and heat intolerance.  Genitourinary: Negative.  Negative for decreased urine volume, difficulty urinating, dysuria, menstrual problem, urgency, vaginal bleeding and vaginal discharge.  Musculoskeletal: Negative.  Negative for arthralgias, back pain and neck pain.  Skin: Negative.  Negative for color change, pallor and rash.  Allergic/Immunologic: Negative.   Neurological: Negative.  Negative for dizziness.  Hematological: Negative for adenopathy. Does not bruise/bleed easily.  Psychiatric/Behavioral: Negative for confusion, decreased concentration, dysphoric mood and sleep disturbance. The patient is nervous/anxious.     Objective:  BP 106/68 (BP Location: Left Arm, Patient Position: Sitting, Cuff Size: Large)   Pulse 94   Temp 98.6 F (37 C)   Resp 16   Ht 5\' 3"  (1.6 m)   Wt 243 lb 1.3 oz (110.3 kg)   SpO2 97%   BMI 43.06 kg/m   BP Readings from Last 3 Encounters:  12/03/17 106/68  09/13/17 (!) 146/74  07/22/17 132/66    Wt Readings from Last 3 Encounters:  12/03/17 243 lb 1.3 oz (110.3 kg)  09/13/17 245 lb (111.1 kg)  08/06/17 250 lb (113.4 kg)    Physical Exam  Constitutional: She is oriented to person, place, and time. No distress.  HENT:  Mouth/Throat: Oropharynx is clear and moist. No oropharyngeal exudate.  Eyes: Conjunctivae are normal. Right eye exhibits no discharge. Left eye exhibits no discharge. No scleral icterus.  Neck: Normal range of motion. Neck supple. No JVD  present. No thyromegaly present.  Cardiovascular: Normal rate, regular rhythm and normal heart sounds. Exam reveals no gallop and no friction rub.  No murmur heard. Pulmonary/Chest: Effort normal and breath sounds normal. No respiratory distress. She has no wheezes. She has no rales.  Abdominal: Soft. Bowel sounds are normal. She exhibits no distension and no mass. There is no tenderness. There is no rebound and no guarding. Hernia confirmed negative in the right inguinal area and confirmed negative in the left inguinal area.  Genitourinary: Rectum normal and uterus normal. Rectal exam shows no external hemorrhoid, no internal hemorrhoid, no fissure, no mass, no tenderness, anal tone normal and guaiac negative stool. No breast swelling, tenderness, discharge or bleeding. Pelvic exam was performed with patient supine. No labial fusion. There is no rash, tenderness, lesion or injury on the right labia. There is no rash, tenderness, lesion or injury on the left labia. Uterus is not deviated, not enlarged, not fixed and not tender. Cervix exhibits no motion tenderness, no discharge and no friability. Right adnexum displays no mass, no tenderness and no fullness. Left adnexum displays no mass, no tenderness and no fullness. No erythema, tenderness or bleeding in the vagina. No foreign body in the vagina. No signs of injury around the vagina. No vaginal discharge found.  Musculoskeletal: Normal range of motion. She exhibits no edema, tenderness or deformity.       Right foot: Normal. There is normal range of motion, no tenderness, no bony tenderness, no swelling and no deformity.  Lymphadenopathy:    She has no cervical adenopathy.       Right: No inguinal adenopathy present.       Left: No inguinal adenopathy present.  Neurological: She is alert and oriented to person, place, and time.  Skin: Skin is warm and dry. No rash noted. She is not diaphoretic. No erythema. No pallor.  Psychiatric: She has a normal  mood and affect. Her behavior is normal. Judgment and thought content normal.  Vitals reviewed.   Lab Results  Component Value Date   WBC 7.3 12/03/2017   HGB 13.3 12/03/2017   HCT 39.7 12/03/2017   PLT 292.0 12/03/2017   GLUCOSE 102 (H) 12/03/2017   CHOL 216 (H) 12/03/2017   TRIG 157.0 (H) 12/03/2017   HDL 45.10 12/03/2017   LDLDIRECT 170.0 07/22/2017   LDLCALC 140 (H) 12/03/2017   ALT 19 12/03/2017   AST 14 12/03/2017   NA 139 12/03/2017   K 4.3 12/03/2017   CL 106 12/03/2017   CREATININE 0.64 12/03/2017   BUN 14 12/03/2017   CO2 26 12/03/2017   TSH 1.80 12/03/2017   INR 1.0 10/19/2015   HGBA1C 5.7 12/03/2017    Mr Lumbar Spine Wo Contrast  Result Date: 12/15/2016 CLINICAL DATA:  62 year old female with chronic lumbar back pain radiating to the left buttock and leg. Progressive symptoms for 2 years. Several falls but no recent injuries. Recent spinal injection with little relief. EXAM:  MRI LUMBAR SPINE WITHOUT CONTRAST TECHNIQUE: Multiplanar, multisequence MR imaging of the lumbar spine was performed. No intravenous contrast was administered. COMPARISON:  Lumbar MRI 05/03/2013 FINDINGS: Segmentation: Lumbar segmentation appears to be normal which is the same numbering system used on the 2014 comparison. Alignment: Mild anterolisthesis of L5 on S1 has not significantly changed. There is mild dextroconvex lumbar scoliosis which does appear increased. Mildly increased straightening of lumbar lordosis since 2014. Vertebrae: No marrow edema or evidence of acute osseous abnormality. Visualized bone marrow signal is within normal limits. Visible sacrum is intact. Conus medullaris: Extends to the L1 level and appears normal. Paraspinal and other soft tissues: Negative. Disc levels: T10-T11:  Mild disc bulge.  No definite stenosis. T11-T12: Mild disc bulge. Mild facet hypertrophy. Mild bilateral T11 foraminal stenosis appears chronic. T12-L1:  Negative. L1-L2:  Negative. L2-L3: Interval disc  desiccation, disc space loss, and circumferential disc bulge with broad-based posterior component (series 7, image 12). Mild facet hypertrophy greater on the left. No significant stenosis. L3-L4: Chronic disc space loss. Vacuum disc suspected. Mild circumferential disc bulge has not significantly changed. Mild facet hypertrophy is stable. Chronic endplate spurring primarily responsible for mild bilateral L3 foraminal stenosis which appears stable. L4-L5: Chronic disc space loss. Vacuum disc suspected. Chronic circumferential but mostly far lateral disc bulging is stable. Mild to moderate facet hypertrophy greater on the left is stable. Endplate spurring. No spinal or lateral recess stenosis. Stable mild left L4 foraminal stenosis. L5-S1: Chronic mild grade 1 anterolisthesis. Stable disc. Moderate to severe facet hypertrophy greater on the right appears stable. No spinal or lateral recess stenosis. Moderate right L5 foraminal stenosis is stable. IMPRESSION: 1. Mild degeneration of the L2-L3 disc since 2014, but no associated stenosis. 2. Chronic disc degeneration at L3-L4 and L4-L5 with vacuum disc and endplate spurring. No associated spinal or lateral recess stenosis, while mild neural foraminal stenosis appears stable. 3. Chronic mild grade 1 anterolisthesis at L5-S1 with up to severe facet hypertrophy worse on the right. No spinal or lateral recess stenosis. Stable moderate right L5 foraminal stenosis. 4. Mild dextroconvex lumbar scoliosis appears increased. No acute osseous abnormality. Electronically Signed   By: Genevie Ann M.D.   On: 12/15/2016 20:43    Assessment & Plan:   Olivia Mosley was seen today for hypothyroidism, annual exam and anemia.  Diagnoses and all orders for this visit:  Acquired hypothyroidism- Her TSH is in the normal range.  She will remain on the current dose of levothyroxine. -     TSH; Future -     levothyroxine (SYNTHROID, LEVOTHROID) 75 MCG tablet; Take 1 tablet (75 mcg total) by mouth  daily.  Other iron deficiency anemia- Her H&H are normal but her iron level remains borderline low.  I have asked her to improve compliance with her iron replacement therapy and to continue to follow-up with her hematologist. -     CBC with Differential/Platelet; Future -     IBC panel; Future -     Ferritin; Future  Hyperglycemia- Her A1c is down to 5.7%.  Improvement noted.  Medical therapy is not indicated. -     Comprehensive metabolic panel; Future -     Hemoglobin A1c; Future  Routine health maintenance- Exam completed, Pap collected, screening for colon cancer and mammogram are up-to-date, vaccines reviewed and updated, she is referred for a DEXA scan, patient education material was given. -     Lipid panel; Future  Hyperlipidemia with target LDL less than 130- She has a  low Framingham risk score so I do not recommend that she start a statin at this time.  Visit for screening mammogram  Vitamin D deficiency -     VITAMIN D 25 Hydroxy (Vit-D Deficiency, Fractures); Future -     DG Bone Density; Future -     Cholecalciferol 2000 units TABS; Take 1 tablet (2,000 Units total) by mouth daily.  Estrogen deficiency -     DG Bone Density; Future  Screening for cervical cancer -     Cancel: Cytology - PAP -     Cytology - PAP; Future  Idiopathic gout, unspecified chronicity, unspecified site - Her uric acid level is low.  I doubt that her recent episode of foot pain was related to gout.  The pain has resolved and examination of her foot today is normal. -     Uric acid; Future   I have changed Jefm Miles. Mcclintic's levothyroxine. I am also having her maintain her Azelaic Acid, tretinoin, RESTASIS, tiZANidine, Iron Polysacch Cmplx-B12-FA, azelastine, ranitidine, doxepin, Crisaborole, escitalopram, ALPRAZolam, traZODone, clobetasol cream, and Cholecalciferol.  Meds ordered this encounter  Medications  . Cholecalciferol 2000 units TABS    Sig: Take 1 tablet (2,000 Units total) by mouth  daily.    Dispense:  90 tablet    Refill:  1  . levothyroxine (SYNTHROID, LEVOTHROID) 75 MCG tablet    Sig: Take 1 tablet (75 mcg total) by mouth daily.    Dispense:  90 tablet    Refill:  1     Follow-up: Return in about 6 months (around 06/05/2018).  Scarlette Calico, MD

## 2017-12-03 NOTE — Patient Instructions (Signed)

## 2017-12-04 ENCOUNTER — Encounter: Payer: Self-pay | Admitting: Internal Medicine

## 2017-12-04 MED ORDER — LEVOTHYROXINE SODIUM 75 MCG PO TABS: 75.0000 ug | ORAL_TABLET | Freq: Every day | ORAL | 1 refills | Status: DC

## 2017-12-04 MED ORDER — CHOLECALCIFEROL 50 MCG (2000 UT) PO TABS: 1.0000 | ORAL_TABLET | Freq: Every day | ORAL | 1 refills | Status: DC

## 2017-12-05 ENCOUNTER — Encounter: Payer: Self-pay | Admitting: Internal Medicine

## 2017-12-05 LAB — CYTOLOGY - PAP
Diagnosis: NEGATIVE
HPV: NOT DETECTED

## 2018-01-03 ENCOUNTER — Other Ambulatory Visit: Payer: Self-pay | Admitting: Internal Medicine

## 2018-01-10 ENCOUNTER — Other Ambulatory Visit: Payer: Managed Care, Other (non HMO)

## 2018-01-10 ENCOUNTER — Ambulatory Visit: Payer: Managed Care, Other (non HMO) | Admitting: Hematology & Oncology

## 2018-01-14 ENCOUNTER — Other Ambulatory Visit: Payer: Self-pay | Admitting: Internal Medicine

## 2018-01-14 DIAGNOSIS — L309 Dermatitis, unspecified: Secondary | ICD-10-CM

## 2018-01-18 ENCOUNTER — Other Ambulatory Visit: Payer: Self-pay | Admitting: Hematology & Oncology

## 2018-01-18 DIAGNOSIS — D508 Other iron deficiency anemias: Secondary | ICD-10-CM

## 2018-01-19 ENCOUNTER — Other Ambulatory Visit: Payer: Self-pay | Admitting: Internal Medicine

## 2018-01-23 ENCOUNTER — Inpatient Hospital Stay: Payer: Managed Care, Other (non HMO) | Attending: Hematology & Oncology

## 2018-01-23 ENCOUNTER — Other Ambulatory Visit: Payer: Self-pay

## 2018-01-23 ENCOUNTER — Encounter: Payer: Self-pay | Admitting: Hematology & Oncology

## 2018-01-23 ENCOUNTER — Inpatient Hospital Stay (HOSPITAL_BASED_OUTPATIENT_CLINIC_OR_DEPARTMENT_OTHER): Payer: Managed Care, Other (non HMO) | Admitting: Hematology & Oncology

## 2018-01-23 VITALS — BP 121/71 | HR 71 | Temp 98.2°F | Resp 20 | Wt 239.5 lb

## 2018-01-23 DIAGNOSIS — M549 Dorsalgia, unspecified: Secondary | ICD-10-CM | POA: Diagnosis not present

## 2018-01-23 DIAGNOSIS — D509 Iron deficiency anemia, unspecified: Secondary | ICD-10-CM | POA: Insufficient documentation

## 2018-01-23 DIAGNOSIS — D500 Iron deficiency anemia secondary to blood loss (chronic): Secondary | ICD-10-CM

## 2018-01-23 DIAGNOSIS — R0981 Nasal congestion: Secondary | ICD-10-CM | POA: Diagnosis not present

## 2018-01-23 LAB — CBC WITH DIFFERENTIAL (CANCER CENTER ONLY)
Basophils Absolute: 0 10*3/uL (ref 0.0–0.1)
Basophils Relative: 0 %
Eosinophils Absolute: 0.2 10*3/uL (ref 0.0–0.5)
Eosinophils Relative: 2 %
HCT: 41.3 % (ref 34.8–46.6)
Hemoglobin: 13.7 g/dL (ref 11.6–15.9)
Lymphocytes Relative: 15 %
Lymphs Abs: 1.3 10*3/uL (ref 0.9–3.3)
MCH: 30.4 pg (ref 26.0–34.0)
MCHC: 33.2 g/dL (ref 32.0–36.0)
MCV: 91.6 fL (ref 81.0–101.0)
Monocytes Absolute: 0.8 10*3/uL (ref 0.1–0.9)
Monocytes Relative: 9 %
Neutro Abs: 6.3 10*3/uL (ref 1.5–6.5)
Neutrophils Relative %: 74 %
Platelet Count: 300 10*3/uL (ref 145–400)
RBC: 4.51 MIL/uL (ref 3.70–5.32)
RDW: 13.9 % (ref 11.1–15.7)
WBC Count: 8.6 10*3/uL (ref 3.9–10.0)

## 2018-01-23 LAB — CMP (CANCER CENTER ONLY)
ALT: 35 U/L (ref 10–47)
AST: 29 U/L (ref 11–38)
Albumin: 3.4 g/dL — ABNORMAL LOW (ref 3.5–5.0)
Alkaline Phosphatase: 70 U/L (ref 26–84)
Anion gap: 7 (ref 5–15)
BUN: 19 mg/dL (ref 7–22)
CO2: 28 mmol/L (ref 18–33)
Calcium: 9.8 mg/dL (ref 8.0–10.3)
Chloride: 105 mmol/L (ref 98–108)
Creatinine: 0.8 mg/dL (ref 0.60–1.20)
Glucose, Bld: 88 mg/dL (ref 73–118)
Potassium: 4.4 mmol/L (ref 3.3–4.7)
Sodium: 140 mmol/L (ref 128–145)
Total Bilirubin: 0.5 mg/dL (ref 0.2–1.6)
Total Protein: 6.9 g/dL (ref 6.4–8.1)

## 2018-01-23 MED ORDER — AMOXICILLIN-POT CLAVULANATE 875-125 MG PO TABS: 1.0000 | ORAL_TABLET | Freq: Two times a day (BID) | ORAL | 0 refills | Status: DC

## 2018-01-23 NOTE — Progress Notes (Signed)
Hematology and Oncology Follow Up Visit  Olivia Mosley [id number] [birth year] 62 y.o. 01/23/2018   Principle Diagnosis:  Iron deficiency anemia  Current Therapy:   Oral Iron supplement daily IV iron as indicated - last received in June 2017   Interim History:  [name] is here today for follow-up.  She is having a couple problems.  Her big problem is that she has sinus issues.  She has a lot of congestion.  She is taking some Allegra.  She is taking some nasal sprays.  She is still bringing out some yellowish mucus from her sinuses.  She is had no fever.  I will go ahead and send in a prescription for Augmentin (875 mg p.o. twice daily x7 days) and hopefully this will help her.  She is taking iron at home.  This seems to be helping her.  Her ferritin was 256 with an iron saturation of 35% when we last saw her in December.  She is having some back problems.  She has had injections.  I think she has had spinal nerve ablation.  This helped a little bit.  It appears that she is having problems at the [place].  She is had no change in bowel or bladder habits.  She has had no leg issues.  She is had no rashes.  Overall, her performance status is ECOG 1.  Medications:  Allergies as of 01/23/2018      Reactions   Flagyl [metronidazole Hcl] Hives      Medication List        Accurate as of 01/23/18  4:09 PM. Always use your most recent med list.          ALPRAZolam 0.5 MG tablet Commonly known as:  XANAX TAKE 1 TABLET BY MOUTH 2 TIMES A DAY AS NEEDED FOR ANXIETY   Azelaic Acid 15 % cream Commonly known as:  FINACEA APPLY 1/2 GRAM TWICE DAILY   azelastine 0.1 % nasal spray Commonly known as:  ASTELIN USE 2 SPRAYS IN EACH NOSTRIL TWICE DAILY   Cholecalciferol 2000 units Tabs Take 1 tablet (2,000 Units total) by mouth daily.   clobetasol cream 0.05 % Commonly known as:  TEMOVATE APPLY TOPICALLY 2 (TWO) TIMES DAILY.   Crisaborole 2 % Oint Commonly known as:  EUCRISA Apply 1  application topically 2 (two) times daily.   doxepin 10 MG capsule Commonly known as:  SINEQUAN TAKE 1 CAPSULE (10 MG TOTAL) BY MOUTH AT BEDTIME.   escitalopram 10 MG tablet Commonly known as:  LEXAPRO Take 1 tablet (10 mg total) by mouth daily.   levothyroxine 75 MCG tablet Commonly known as:  SYNTHROID, LEVOTHROID Take 1 tablet (75 mcg total) by mouth daily.   POLY-IRON 150 FORTE 150-0.025-1 MG Caps Generic drug:  Iron Polysacch Cmplx-B12-FA TAKE 1 CAPSULE BY MOUTH EVERY DAY   RESTASIS 0.05 % ophthalmic emulsion Generic drug:  cycloSPORINE   tiZANidine 4 MG tablet Commonly known as:  ZANAFLEX   traZODone 50 MG tablet Commonly known as:  DESYREL TAKE 2 TABLETS BY MOUTH AT BEDTIME AS NEEDED FOR SLEEP   tretinoin 0.025 % cream Commonly known as:  RETIN-A Apply topically at bedtime.   ZANTAC 75 75 MG tablet Generic drug:  ranitidine Take 75 mg by mouth 2 (two) times daily.       Allergies:  Allergies  Allergen Reactions  . Flagyl [Metronidazole Hcl] Hives    Past Medical History, Surgical history, Social history, and Family History were reviewed and  updated.  Review of Systems: Review of Systems  All other systems reviewed and are negative.   Physical Exam:  weight is 239 lb 8 oz (108.6 kg). Her oral temperature is 98.2 F (36.8 C). Her blood pressure is 121/71 and her pulse is 71. Her respiration is 20 and oxygen saturation is 97%.   Wt Readings from Last 3 Encounters:  01/23/18 239 lb 8 oz (108.6 kg)  12/03/17 243 lb 1.3 oz (110.3 kg)  09/13/17 245 lb (111.1 kg)    Physical Exam  Constitutional: She is oriented to person, place, and time.  HENT:  Head: Normocephalic and atraumatic.  Mouth/Throat: Oropharynx is clear and moist.  Eyes: Pupils are equal, round, and reactive to light. EOM are normal.  Neck: Normal range of motion.  Cardiovascular: Normal rate, regular rhythm and normal heart sounds.  Pulmonary/Chest: Effort normal and breath sounds  normal.  Abdominal: Soft. Bowel sounds are normal.  Musculoskeletal: Normal range of motion. She exhibits no edema, tenderness or deformity.  Lymphadenopathy:    She has no cervical adenopathy.  Neurological: She is alert and oriented to person, place, and time.  Skin: Skin is warm and dry. No rash noted. No erythema.  Psychiatric: She has a normal mood and affect. Her behavior is normal. Judgment and thought content normal.  Vitals reviewed.    Lab Results  Component Value Date   WBC 8.6 01/23/2018   HGB 13.7 01/23/2018   HCT 41.3 01/23/2018   MCV 91.6 01/23/2018   PLT 300 01/23/2018   Lab Results  Component Value Date   FERRITIN 243.1 12/03/2017   IRON 49 12/03/2017   TIBC 246 09/13/2017   UIBC 161 09/13/2017   IRONPCTSAT 17.0 (L) 12/03/2017   Lab Results  Component Value Date   RETICCTPCT 1.6 09/01/2015   RBC 4.51 01/23/2018   RETICCTABS 77.8 09/01/2015   No results found for: KPAFRELGTCHN, LAMBDASER, KAPLAMBRATIO No results found for: Kandis Cocking, IGMSERUM No results found for: Odetta Pink, SPEI   Chemistry      Component Value Date/Time   NA 140 01/23/2018 1531   NA 140 09/13/2017 1125   NA 138 09/01/2015 1117   K 4.4 01/23/2018 1531   K 4.2 09/13/2017 1125   K 4.4 09/01/2015 1117   CL 105 01/23/2018 1531   CL 104 09/13/2017 1125   CO2 28 01/23/2018 1531   CO2 27 09/13/2017 1125   CO2 18 (L) 09/01/2015 1117   BUN 19 01/23/2018 1531   BUN 17 09/13/2017 1125   BUN 29.4 (H) 09/01/2015 1117   CREATININE 0.80 01/23/2018 1531   CREATININE 0.7 09/13/2017 1125   CREATININE 0.9 09/01/2015 1117      Component Value Date/Time   CALCIUM 9.8 01/23/2018 1531   CALCIUM 9.6 09/13/2017 1125   CALCIUM 10.1 09/01/2015 1117   ALKPHOS 70 01/23/2018 1531   ALKPHOS 70 09/13/2017 1125   ALKPHOS 90 09/01/2015 1117   AST 29 01/23/2018 1531   AST 13 09/01/2015 1117   ALT 35 01/23/2018 1531   ALT 31 09/13/2017 1125     ALT 15 09/01/2015 1117   BILITOT 0.5 01/23/2018 1531   BILITOT 0.33 09/01/2015 1117      Impression and Plan: [name] is a very pleasant 62 yo caucasian female with iron deficiency anemia.   She will continue to take the oral supplement.  I will see her back in another 6 months.   Volanda Napoleon, MD 5/2/20194:09  PM

## 2018-01-24 ENCOUNTER — Other Ambulatory Visit: Payer: Self-pay | Admitting: *Deleted

## 2018-01-24 LAB — IRON AND TIBC
Iron: 66 ug/dL (ref 41–142)
Saturation Ratios: 24 % (ref 21–57)
TIBC: 269 ug/dL (ref 236–444)
UIBC: 203 ug/dL

## 2018-01-24 MED ORDER — AMOXICILLIN-POT CLAVULANATE 875-125 MG PO TABS: 1.0000 | ORAL_TABLET | Freq: Two times a day (BID) | ORAL | 0 refills | Status: DC

## 2018-01-28 ENCOUNTER — Encounter: Payer: Self-pay | Admitting: *Deleted

## 2018-02-02 IMAGING — NM NM MISC PROCEDURE
10 series · 60 of 60 positions shown · non-contrast
Comparison: none

[Series 1: wbr_s-proj_st stress-gsp · 6.40mm/px · 6 of 512 frames shown (1 of 2)]
[frame 43/512]
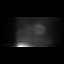
[frame 128/512]
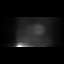
[frame 214/512]
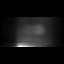
[frame 299/512]
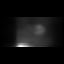
[frame 384/512]
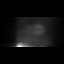
[frame 470/512]
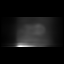

[Series 1: stress-sum-em · 6.40mm/px · 6 of 64 frames shown (1 of 2)]
[frame 6/64]
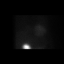
[frame 16/64]
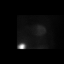
[frame 27/64]
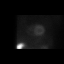
[frame 38/64]
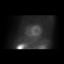
[frame 48/64]
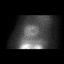
[frame 59/64]
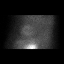

[Series 1: wbr_s-proj_st stress-sum-em · 6.40mm/px · 6 of 64 frames shown (1 of 2)]
[frame 6/64]
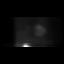
[frame 16/64]
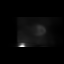
[frame 27/64]
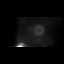
[frame 38/64]
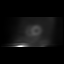
[frame 48/64]
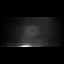
[frame 59/64]
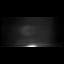

[Series 1: stress-gsp · 6.40mm/px · 6 of 512 frames shown (1 of 2)]
[frame 43/512]
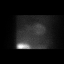
[frame 128/512]
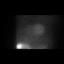
[frame 214/512]
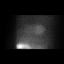
[frame 299/512]
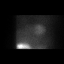
[frame 384/512]
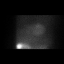
[frame 470/512]
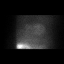

[Series 1: wbr_s-proj_st stress-gsp · 6.40mm/px · 6 of 512 frames shown (2 of 2)]
[frame 43/512]
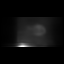
[frame 128/512]
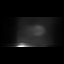
[frame 214/512]
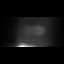
[frame 299/512]
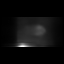
[frame 384/512]
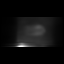
[frame 470/512]
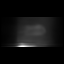

[Series 1: wbr_r-proj_st rest · 6.40mm/px · 6 of 64 frames shown]
[frame 6/64]
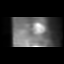
[frame 16/64]
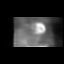
[frame 27/64]
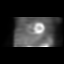
[frame 38/64]
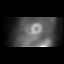
[frame 48/64]
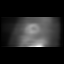
[frame 59/64]
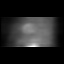

[Series 1: rest · 6.40mm/px · 6 of 64 frames shown]
[frame 6/64]
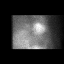
[frame 16/64]
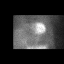
[frame 27/64]
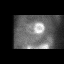
[frame 38/64]
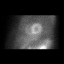
[frame 48/64]
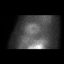
[frame 59/64]
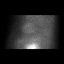

[Series 1: stress-sum-em · 6.40mm/px · 6 of 64 frames shown (2 of 2)]
[frame 6/64]
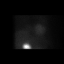
[frame 16/64]
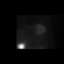
[frame 27/64]
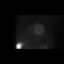
[frame 38/64]
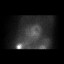
[frame 48/64]
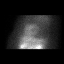
[frame 59/64]
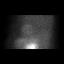

[Series 1: wbr_s-proj_st stress-sum-em · 6.40mm/px · 6 of 64 frames shown (2 of 2)]
[frame 6/64]
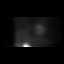
[frame 16/64]
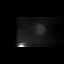
[frame 27/64]
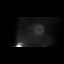
[frame 38/64]
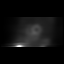
[frame 48/64]
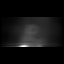
[frame 59/64]
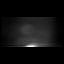

[Series 1: stress-gsp · 6.40mm/px · 6 of 512 frames shown (2 of 2)]
[frame 43/512]
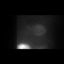
[frame 128/512]
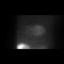
[frame 214/512]
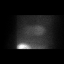
[frame 299/512]
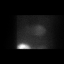
[frame 384/512]
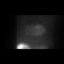
[frame 470/512]
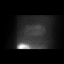

[60 of 60 positions shown; findings below may reference images not displayed]

Canned report from images found in remote index.

Refer to host system for actual result text.

## 2018-02-20 ENCOUNTER — Telehealth: Payer: Self-pay | Admitting: Internal Medicine

## 2018-02-20 ENCOUNTER — Other Ambulatory Visit: Payer: Managed Care, Other (non HMO)

## 2018-02-20 NOTE — Telephone Encounter (Signed)
Please disregard this phone note. Previous note was already started.

## 2018-02-20 NOTE — Telephone Encounter (Signed)
Left detailed message for pt to call back with the location in [place] that she is wanting to have the DEXA done. We are not familiar with locations in [place] that do DEXA and will need that information in order to proceed with send them the order.

## 2018-02-20 NOTE — Telephone Encounter (Signed)
Copied from [facility] [id number]. Topic: Quick Communication - Appointment Cancellation >> Feb 20, 2018  8:24 AM Conception Chancy, NT wrote: Patient called to cancel appointment scheduled for 02/20/18, patient had a Dexa scan scheduled. Patient has not rescheduled their appointment.  Patient would like a referral to a place in [place] to get her Dexa scan done.   Route to department's PEC pool. >> Feb 20, 2018 10:05 AM Cairrikier Dian Queen, CMA wrote: Left detailed message for pt to call back with the location in [place] that she is wanting to have the DEXA done. We are not familiar with locations in [place] that do DEXA and will need that information in order to proceed with send them the order.  >> Feb 20, 2018  4:30 PM Cleaster Corin, [place] wrote: [facility] imag. phone # [phone] fax# [phone]

## 2018-02-20 NOTE — Telephone Encounter (Signed)
[facility] imag. phone # [phone] fax# [phone]   This has been faxed as requested.

## 2018-02-20 NOTE — Telephone Encounter (Signed)
Copied from [place] [id number]. Topic: Quick Communication - Appointment Cancellation >> Feb 20, 2018  8:24 AM Conception Chancy, NT wrote: Patient called to cancel appointment scheduled for 02/20/18, patient had a Dexa scan scheduled. Patient has not rescheduled their appointment.  Patient would like a referral to a place in [place] to get her Dexa scan done.   Route to department's PEC pool.

## 2018-02-24 NOTE — Telephone Encounter (Signed)
LVM for patient to call back with location of where she would like this done.

## 2018-03-04 LAB — HM DEXA SCAN

## 2018-03-18 ENCOUNTER — Encounter: Payer: Self-pay | Admitting: Internal Medicine

## 2018-06-03 ENCOUNTER — Other Ambulatory Visit: Payer: Self-pay | Admitting: Internal Medicine

## 2018-06-03 DIAGNOSIS — E559 Vitamin D deficiency, unspecified: Secondary | ICD-10-CM

## 2018-06-05 ENCOUNTER — Ambulatory Visit (INDEPENDENT_AMBULATORY_CARE_PROVIDER_SITE_OTHER)
Admission: RE | Admit: 2018-06-05 | Discharge: 2018-06-05 | Disposition: A | Discharge status: Home / Self Care | Payer: Managed Care, Other (non HMO) | Source: Ambulatory Visit | Attending: Internal Medicine | Admitting: Internal Medicine

## 2018-06-05 ENCOUNTER — Ambulatory Visit (INDEPENDENT_AMBULATORY_CARE_PROVIDER_SITE_OTHER): Payer: Managed Care, Other (non HMO) | Admitting: Internal Medicine

## 2018-06-05 ENCOUNTER — Encounter: Payer: Self-pay | Admitting: Internal Medicine

## 2018-06-05 ENCOUNTER — Other Ambulatory Visit (INDEPENDENT_AMBULATORY_CARE_PROVIDER_SITE_OTHER): Payer: Managed Care, Other (non HMO)

## 2018-06-05 VITALS — BP 122/84 | HR 85 | Temp 98.5°F | Resp 16 | Ht 63.0 in | Wt 238.0 lb

## 2018-06-05 DIAGNOSIS — M79621 Pain in right upper arm: Secondary | ICD-10-CM

## 2018-06-05 DIAGNOSIS — E039 Hypothyroidism, unspecified: Secondary | ICD-10-CM | POA: Diagnosis not present

## 2018-06-05 DIAGNOSIS — D508 Other iron deficiency anemias: Secondary | ICD-10-CM

## 2018-06-05 DIAGNOSIS — M810 Age-related osteoporosis without current pathological fracture: Secondary | ICD-10-CM

## 2018-06-05 LAB — CBC WITH DIFFERENTIAL/PLATELET
Basophils Absolute: 0.1 10*3/uL (ref 0.0–0.1)
Basophils Relative: 0.8 % (ref 0.0–3.0)
Eosinophils Absolute: 0.2 10*3/uL (ref 0.0–0.7)
Eosinophils Relative: 3.4 % (ref 0.0–5.0)
HCT: 41.7 % (ref 36.0–46.0)
Hemoglobin: 14.1 g/dL (ref 12.0–15.0)
Lymphocytes Relative: 18.4 % (ref 12.0–46.0)
Lymphs Abs: 1.3 10*3/uL (ref 0.7–4.0)
MCHC: 33.8 g/dL (ref 30.0–36.0)
MCV: 89.3 fl (ref 78.0–100.0)
Monocytes Absolute: 0.6 10*3/uL (ref 0.1–1.0)
Monocytes Relative: 8.9 % (ref 3.0–12.0)
Neutro Abs: 4.8 10*3/uL (ref 1.4–7.7)
Neutrophils Relative %: 68.5 % (ref 43.0–77.0)
Platelets: 313 10*3/uL (ref 150.0–400.0)
RBC: 4.66 Mil/uL (ref 3.87–5.11)
RDW: 13.6 % (ref 11.5–15.5)
WBC: 7 10*3/uL (ref 4.0–10.5)

## 2018-06-05 LAB — TSH: TSH: 2.46 u[IU]/mL (ref 0.35–4.50)

## 2018-06-05 NOTE — Progress Notes (Signed)
Subjective:  Patient ID: Olivia Mosley, female    DOB: 1955/12/23  Age: 62 y.o. MRN: [id number]  CC: Hypothyroidism   HPI Olivia Mosley presents for f/up - She complains of a one year history of right upper extremity pain.  She localizes the pain to the middle of the humerus.  She describes it as excruciating.  She denies neck pain or paresthesias.  She has myalgias elsewhere but this is her most significant discomfort.  She also complains of fatigue and says she sleeps most of the day.  She wants to start tapering off some of her psychiatric medications.  She struggles with insomnia that she describes as difficulty falling asleep.  She denies weight changes, constipation, edema, chest pain, or shortness of breath.  Outpatient Medications Prior to Visit  Medication Sig Dispense Refill  . Azelaic Acid (FINACEA) 15 % cream APPLY 1/2 GRAM TWICE DAILY 50 g 11  . azelastine (ASTELIN) 0.1 % nasal spray USE 2 SPRAYS IN EACH NOSTRIL TWICE DAILY 30 mL 11  . Cholecalciferol (VITAMIN D) 2000 units tablet TAKE 1 TABLET BY MOUTH EVERY DAY 30 tablet 5  . clobetasol cream (TEMOVATE) 0.05 % APPLY TOPICALLY 2 (TWO) TIMES DAILY. 30 g 1  . Crisaborole (EUCRISA) 2 % OINT Apply 1 application topically 2 (two) times daily. 60 g 11  . doxepin (SINEQUAN) 10 MG capsule TAKE 1 CAPSULE (10 MG TOTAL) BY MOUTH AT BEDTIME. 90 capsule 1  . escitalopram (LEXAPRO) 10 MG tablet Take 1 tablet (10 mg total) by mouth daily. 90 tablet 1  . levothyroxine (SYNTHROID, LEVOTHROID) 75 MCG tablet Take 1 tablet (75 mcg total) by mouth daily. 90 tablet 1  . POLY-IRON 150 FORTE 150-25-1 MG-MCG-MG CAPS TAKE 1 CAPSULE BY MOUTH EVERY DAY 90 each 2  . ranitidine (ZANTAC 75) 75 MG tablet Take 75 mg by mouth 2 (two) times daily.    . RESTASIS 0.05 % ophthalmic emulsion     . traZODone (DESYREL) 50 MG tablet TAKE 2 TABLETS BY MOUTH AT BEDTIME AS NEEDED FOR SLEEP 180 tablet 1  . tretinoin (RETIN-A) 0.025 % cream Apply topically at bedtime. 45  g 5  . ALPRAZolam (XANAX) 0.5 MG tablet TAKE 1 TABLET BY MOUTH 2 TIMES A DAY AS NEEDED FOR ANXIETY 60 tablet 0  . tiZANidine (ZANAFLEX) 4 MG tablet     . amoxicillin-clavulanate (AUGMENTIN) 875-125 MG tablet Take 1 tablet by mouth 2 (two) times daily. 14 tablet 0   No facility-administered medications prior to visit.     ROS Review of Systems  Constitutional: Positive for fatigue. Negative for appetite change, diaphoresis and unexpected weight change.  HENT: Negative.  Negative for sore throat and trouble swallowing.   Eyes: Negative for visual disturbance.  Respiratory: Negative for cough, chest tightness, shortness of breath and wheezing.   Cardiovascular: Negative for chest pain, palpitations and leg swelling.  Gastrointestinal: Negative for abdominal pain, constipation, diarrhea, nausea and vomiting.  Endocrine: Negative.  Negative for cold intolerance and heat intolerance.  Genitourinary: Negative.  Negative for difficulty urinating.  Musculoskeletal: Positive for arthralgias and myalgias. Negative for back pain, gait problem, joint swelling, neck pain and neck stiffness.  Skin: Negative.  Negative for color change and rash.  Neurological: Negative.  Negative for dizziness, weakness and light-headedness.  Hematological: Negative for adenopathy. Does not bruise/bleed easily.  Psychiatric/Behavioral: Positive for sleep disturbance. Negative for confusion, decreased concentration, dysphoric mood and suicidal ideas. The patient is not nervous/anxious.     Objective:  BP 122/84   Pulse 85   Temp 98.5 F (36.9 C) (Oral)   Resp 16   Ht 5\' 3"  (1.6 m)   Wt 238 lb (108 kg)   SpO2 98%   BMI 42.16 kg/m   BP Readings from Last 3 Encounters:  06/05/18 122/84  01/23/18 121/71  12/03/17 106/68    Wt Readings from Last 3 Encounters:  06/05/18 238 lb (108 kg)  01/23/18 239 lb 8 oz (108.6 kg)  12/03/17 243 lb 1.3 oz (110.3 kg)    Physical Exam  Constitutional: She is oriented to  person, place, and time. No distress.  HENT:  Mouth/Throat: Oropharynx is clear and moist. No oropharyngeal exudate.  Eyes: Conjunctivae are normal. No scleral icterus.  Neck: Normal range of motion and full passive range of motion without pain. Neck supple. No JVD present. No neck rigidity. No edema and normal range of motion present. No thyroid mass and no thyromegaly present.  Cardiovascular: Normal rate, regular rhythm and normal heart sounds. Exam reveals no gallop.  No murmur heard. Pulmonary/Chest: Effort normal and breath sounds normal. She has no wheezes. She has no rhonchi. She has no rales.  Abdominal: Soft. Normal appearance and bowel sounds are normal. She exhibits no mass. There is no hepatosplenomegaly. There is no tenderness.  Musculoskeletal: Normal range of motion. She exhibits no edema or tenderness.       Right shoulder: She exhibits normal range of motion, no tenderness, no bony tenderness, no swelling, no crepitus and no deformity.       Right elbow: Normal.She exhibits normal range of motion, no swelling, no effusion and no deformity.       Cervical back: Normal. She exhibits normal range of motion, no tenderness, no bony tenderness, no swelling, no edema, no deformity and no pain.       Right upper arm: Normal. She exhibits no tenderness, no bony tenderness, no swelling, no edema, no deformity and no laceration.  Lymphadenopathy:    She has no cervical adenopathy.  Neurological: She is alert and oriented to person, place, and time. She has normal strength. She displays no atrophy and no tremor. No cranial nerve deficit or sensory deficit. She exhibits normal muscle tone. She displays a negative Romberg sign. She displays no seizure activity. Coordination and gait normal.  Skin: Skin is warm and dry. She is not diaphoretic. No pallor.  Vitals reviewed.   Lab Results  Component Value Date   WBC 7.0 06/05/2018   HGB 14.1 06/05/2018   HCT 41.7 06/05/2018   PLT 313.0  06/05/2018   GLUCOSE 88 01/23/2018   CHOL 216 (H) 12/03/2017   TRIG 157.0 (H) 12/03/2017   HDL 45.10 12/03/2017   LDLDIRECT 170.0 07/22/2017   LDLCALC 140 (H) 12/03/2017   ALT 35 01/23/2018   AST 29 01/23/2018   NA 140 01/23/2018   K 4.4 01/23/2018   CL 105 01/23/2018   CREATININE 0.80 01/23/2018   BUN 19 01/23/2018   CO2 28 01/23/2018   TSH 2.46 06/05/2018   INR 1.0 10/19/2015   HGBA1C 5.7 12/03/2017    No results found.  Assessment & Plan:   Jacara was seen today for hypothyroidism.  Diagnoses and all orders for this visit:  Acquired hypothyroidism- Her TSH is in the normal range.  She will remain on the current dose of levothyroxine. -     Cancel: TSH; Future -     TSH; Future  Pain in right upper arm- The exam is  normal.  Her plain x-ray is normal.  Her CK level is normal.  I think this is fibromyalgia and I have asked her to be more active and to start exercising with activities such as yoga.  If her symptoms do not improve then we will consider doing an MRI of the cervical spine to see if there is a radicular cause for this. -     DG Humerus Right; Future -     CK; Future  Other iron deficiency anemia- Improvement noted. -     CBC with Differential/Platelet; Future -     IBC panel; Future   I have discontinued Jefm Miles. Effertz's tiZANidine, ALPRAZolam, and amoxicillin-clavulanate. I am also having her maintain her Azelaic Acid, tretinoin, RESTASIS, azelastine, ranitidine, Crisaborole, clobetasol cream, levothyroxine, escitalopram, doxepin, POLY-IRON 150 FORTE, traZODone, and Vitamin D.  No orders of the defined types were placed in this encounter.    Follow-up: Return in about 4 weeks (around 07/03/2018).  Scarlette Calico, MD

## 2018-06-05 NOTE — Patient Instructions (Signed)
Hypothyroidism Hypothyroidism is a disorder of the thyroid. The thyroid is a large gland that is located in the lower front of the neck. The thyroid releases hormones that control how the body works. With hypothyroidism, the thyroid does not make enough of these hormones. What are the causes? Causes of hypothyroidism may include:  Viral infections.  Pregnancy.  Your own defense system (immune system) attacking your thyroid.  Certain medicines.  Birth defects.  Past radiation treatments to your head or neck.  Past treatment with radioactive iodine.  Past surgical removal of part or all of your thyroid.  Problems with the gland that is located in the center of your brain (pituitary).  What are the signs or symptoms? Signs and symptoms of hypothyroidism may include:  Feeling as though you have no energy (lethargy).  Inability to tolerate cold.  Weight gain that is not explained by a change in diet or exercise habits.  Dry skin.  Coarse hair.  Menstrual irregularity.  Slowing of thought processes.  Constipation.  Sadness or depression.  How is this diagnosed? Your health care provider may diagnose hypothyroidism with blood tests and ultrasound tests. How is this treated? Hypothyroidism is treated with medicine that replaces the hormones that your body does not make. After you begin treatment, it may take several weeks for symptoms to go away. Follow these instructions at home:  Take medicines only as directed by your health care provider.  If you start taking any new medicines, tell your health care provider.  Keep all follow-up visits as directed by your health care provider. This is important. As your condition improves, your dosage needs may change. You will need to have blood tests regularly so that your health care provider can watch your condition. Contact a health care provider if:  Your symptoms do not get better with treatment.  You are taking thyroid  replacement medicine and: ? You sweat excessively. ? You have tremors. ? You feel anxious. ? You lose weight rapidly. ? You cannot tolerate heat. ? You have emotional swings. ? You have diarrhea. ? You feel weak. Get help right away if:  You develop chest pain.  You develop an irregular heartbeat.  You develop a rapid heartbeat. This information is not intended to replace advice given to you by your health care provider. Make sure you discuss any questions you have with your health care provider. Document Released: 09/10/2005 Document Revised: 02/16/2016 Document Reviewed: 01/26/2014 Elsevier Interactive Patient Education  2018 [employer]  

## 2018-06-06 ENCOUNTER — Encounter: Payer: Self-pay | Admitting: Internal Medicine

## 2018-06-06 LAB — IBC PANEL
Iron: 60 ug/dL (ref 42–145)
Saturation Ratios: 17 % — ABNORMAL LOW (ref 20.0–50.0)
Transferrin: 252 mg/dL (ref 212.0–360.0)

## 2018-06-06 LAB — CK: Total CK: 59 U/L (ref 7–177)

## 2018-06-07 ENCOUNTER — Encounter: Payer: Self-pay | Admitting: Internal Medicine

## 2018-06-17 ENCOUNTER — Other Ambulatory Visit: Payer: Self-pay | Admitting: Internal Medicine

## 2018-06-17 DIAGNOSIS — E559 Vitamin D deficiency, unspecified: Secondary | ICD-10-CM

## 2018-06-17 DIAGNOSIS — M810 Age-related osteoporosis without current pathological fracture: Secondary | ICD-10-CM | POA: Insufficient documentation

## 2018-06-26 ENCOUNTER — Encounter: Payer: Self-pay | Admitting: Nurse Practitioner

## 2018-06-26 ENCOUNTER — Ambulatory Visit (INDEPENDENT_AMBULATORY_CARE_PROVIDER_SITE_OTHER): Payer: Managed Care, Other (non HMO) | Admitting: Nurse Practitioner

## 2018-06-26 ENCOUNTER — Other Ambulatory Visit: Payer: Managed Care, Other (non HMO)

## 2018-06-26 VITALS — BP 110/80 | HR 76 | Temp 98.1°F | Ht 63.0 in | Wt 237.0 lb

## 2018-06-26 DIAGNOSIS — R300 Dysuria: Secondary | ICD-10-CM

## 2018-06-26 DIAGNOSIS — F418 Other specified anxiety disorders: Secondary | ICD-10-CM

## 2018-06-26 LAB — POCT URINALYSIS DIPSTICK
Bilirubin, UA: NEGATIVE
Blood, UA: NEGATIVE
Glucose, UA: NEGATIVE
Ketones, UA: NEGATIVE
Nitrite, UA: NEGATIVE
Protein, UA: NEGATIVE
Spec Grav, UA: 1.01 (ref 1.010–1.025)
Urobilinogen, UA: 0.2 E.U./dL
pH, UA: 6 (ref 5.0–8.0)

## 2018-06-26 MED ORDER — NITROFURANTOIN MONOHYD MACRO 100 MG PO CAPS: 100.0000 mg | ORAL_CAPSULE | Freq: Two times a day (BID) | ORAL | 0 refills | Status: DC

## 2018-06-26 NOTE — Patient Instructions (Signed)
Please start antibiotic as prescribed  I will let you know when your urine culture returns.  Please schedule regular follow up with Dr Ronnald Ramp as discussed   Urinary Frequency, Adult Urinary frequency means urinating more often than usual. People with urinary frequency urinate at least 8 times in 24 hours, even if they drink a normal amount of fluid. Although they urinate more often than normal, the total amount of urine produced in a day may be normal. Urinary frequency is also called pollakiuria. What are the causes? This condition may be caused by:  A urinary tract infection.  Obesity.  Bladder problems, such as bladder stones.  Caffeine or alcohol.  Eating food or drinking fluids that irritate the bladder. These include coffee, tea, soda, artificial sweeteners, citrus, tomato-based foods, and chocolate.  Certain medicines, such as medicines that help the body get rid of extra fluid (diuretics).  Muscle or nerve weakness.  Overactive bladder.  Chronic diabetes.  Interstitial cystitis.  In men, problems with the prostate, such as an enlarged prostate.  In women, pregnancy.  In some cases, the cause may not be known. What increases the risk? This condition is more likely to develop in:  Women who have gone through menopause.  Men with prostate problems.  People with a disease or injury that affects the nerves or spinal cord.  People who have or have had a condition that affects the brain, such as a stroke.  What are the signs or symptoms? Symptoms of this condition include:  Feeling an urgent need to urinate often. The stress and anxiety of needing to find a bathroom quickly can make this urge worse.  Urinating 8 or more times in 24 hours.  Urinating as often as every 1 to 2 hours.  How is this diagnosed? This condition is diagnosed based on your symptoms, your medical history, and a physical exam. You may have tests, such as:  Blood tests.  Urine  tests.  Imaging tests, such as X-rays or ultrasounds.  A bladder test.  A test of your neurological system. This is the body system that senses the need to urinate.  A test to check for problems in the urethra and bladder called cystoscopy.  You may also be asked to keep a bladder diary. A bladder diary is a record of what you eat and drink, how often you urinate, and how much you urinate. You may need to see a health care provider who specializes in conditions of the urinary tract (urologist) or kidneys (nephrologist). How is this treated? Treatment for this condition depends on the cause. Sometimes the condition goes away on its own and treatment is not necessary. If treatment is needed, it may include:  Taking medicine.  Learning exercises that strengthen the muscles that help control urination.  Following a bladder training program. This may include: ? Learning to delay going to the bathroom. ? Double urinating (voiding). This helps if you are not completely emptying your bladder. ? Scheduled voiding.  Making diet changes, such as: ? Avoiding caffeine. ? Drinking fewer fluids, especially alcohol. ? Not drinking in the evening. ? Not having foods or drinks that may irritate the bladder. ? Eating foods that help prevent or ease constipation. Constipation can make this condition worse.  Having the nerves in your bladder stimulated. There are two options for stimulating the nerves to your bladder: ? Outpatient electrical nerve stimulation. This is done by your health care provider. ? Surgery to implant a bladder pacemaker. The pacemaker  helps to control the urge to urinate.  Follow these instructions at home:  Keep a bladder diary if told to by your health care provider.  Take over-the-counter and prescription medicines only as told by your health care provider.  Do any exercises as told by your health care provider.  Follow a bladder training program as told by your health  care provider.  Make any recommended diet changes.  Keep all follow-up visits as told by your health care provider. This is important. Contact a health care provider if:  You start urinating more often.  You feel pain or irritation when you urinate.  You notice blood in your urine.  Your urine looks cloudy.  You develop a fever.  You begin vomiting. Get help right away if:  You are unable to urinate. This information is not intended to replace advice given to you by your health care provider. Make sure you discuss any questions you have with your health care provider. Document Released: 07/07/2009 Document Revised: 10/12/2015 Document Reviewed: 04/06/2015 Elsevier Interactive Patient Education  [employer].

## 2018-06-26 NOTE — Assessment & Plan Note (Addendum)
Some of her symptoms could be related to possible UTI, which I am treating today Per chart review, it looks like she discussed weaning her daily meds for anxiety and depression with her PCP at last OV on 06/05/18, was asked to RTC in 1 month for follow up which she has not scheduled I have instructed her to schedule requested F/U with PCP for further evaluation and management of anxiety and dperession

## 2018-06-26 NOTE — Progress Notes (Signed)
Name: Olivia Mosley   MRN: [id number]    DOB: [birth year]   Date:06/26/2018       Progress Note  Subjective  Chief Complaint  Chief Complaint  Patient presents with  . Dysuria    x 5 days  . Urinary Frequency  . Altered Mental Status    HPI  Olivia Mosley is here today for evaluation of acute complaint of dysuria, urinary frequency, urgency which began about 5 days ago along with mild nausea, mild lower back pain. She denies fevers, chills, abdominal pain, vaginal discharge or bleeding, hematuria Shes tried to increase fluids with no improvement Reports she often holds her urine for too long but She denies history of UTI  She is also requesting evaluation of mood change, has just "felt off, not like myself," malaise for about 1-2 weeks now, noticed this change after stopping  her doxepin and lexapro cold [place]. She is having trouble sleeping, feeling like she just cant think clearly or concentrate on things, and feels anxious. She denies syncope, confusion, headaches, depression, SI, HI. She says she stopped her medications because she just does not want to be on so many medications.    Patient Active Problem List   Diagnosis Date Noted  . Osteoporosis 06/17/2018  . Pain in right upper arm 06/05/2018  . Estrogen deficiency 12/03/2017  . Screening for cervical cancer 12/03/2017  . Idiopathic gout 12/03/2017  . IBS (irritable bowel syndrome) 05/03/2016  . Visit for screening mammogram 10/31/2015  . Allergic rhinitis due to animal hair and dander 04/20/2015  . Carpal tunnel syndrome 11/10/2014  . Gastroesophageal reflux disease without esophagitis 07/09/2014  . Vitamin D deficiency 07/09/2014  . Anemia, iron deficiency 03/10/2013  . Back pain, chronic 03/10/2013  . Insomnia 03/14/2012  . Eczema 03/14/2012  . Routine health maintenance 03/14/2012  . Hyperglycemia 01/27/2012  . Hyperlipidemia with target LDL less than 130   . Hypothyroidism   . Depression with anxiety   . 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 Recurrent major depressive episodes ([place]) 09/29/2007    Social History   Tobacco Use  . Smoking status: Former Smoker    Packs/day: 0.25    Years: 26.00    Pack years: 6.50    Types: Cigarettes    Start date: 10/31/1975    Last attempt to quit: 10/30/2002    Years since quitting: 15.6  . Smokeless tobacco: Never Used  . Tobacco comment: quit 11 years ago  Substance Use Topics  . Alcohol use: No    Alcohol/week: 0.0 standard drinks    Comment: SOCIALLY     Current Outpatient Medications:  .  Azelaic Acid (FINACEA) 15 % cream, APPLY 1/2 GRAM TWICE DAILY, Disp: 50 g, Rfl: 11 .  azelastine (ASTELIN) 0.1 % nasal spray, USE 2 SPRAYS IN EACH NOSTRIL TWICE DAILY, Disp: 30 mL, Rfl: 11 .  carisoprodol (SOMA) 350 MG tablet, Take 350 mg by mouth 3 (three) times daily as needed., Disp: , Rfl: 0 .  Cholecalciferol (VITAMIN D) 2000 units tablet, TAKE 1 TABLET BY MOUTH EVERY DAY, Disp: 90 tablet, Rfl: 1 .  clobetasol cream (TEMOVATE) 0.05 %, APPLY TOPICALLY 2 (TWO) TIMES DAILY., Disp: 30 g, Rfl: 1 .  Crisaborole (EUCRISA) 2 % OINT, Apply 1 application topically 2 (two) times daily., Disp: 60 g, Rfl: 11 .  Ketorolac Tromethamine 15.75 MG/SPRAY SOLN, Place into the nose as needed., Disp: , Rfl:  .  levothyroxine (SYNTHROID, LEVOTHROID) 75 MCG tablet, Take  1 tablet (75 mcg total) by mouth daily., Disp: 90 tablet, Rfl: 1 .  meloxicam (MOBIC) 15 MG tablet, Take 1 tablet by mouth daily as needed., Disp: , Rfl: 0 .  POLY-IRON 150 FORTE 150-25-1 MG-MCG-MG CAPS, TAKE 1 CAPSULE BY MOUTH EVERY DAY, Disp: 90 each, Rfl: 2 .  ranitidine (ZANTAC 75) 75 MG tablet, Take 75 mg by mouth 2 (two) times daily., Disp: , Rfl:  .  RESTASIS 0.05 % ophthalmic emulsion, , Disp: , Rfl:  .  traZODone (DESYREL) 50 MG tablet, TAKE 2 TABLETS BY MOUTH AT BEDTIME AS NEEDED FOR SLEEP, Disp: 180 tablet, Rfl: 1 .  tretinoin (RETIN-A) 0.025 % cream, Apply topically at bedtime.,  Disp: 45 g, Rfl: 5 .  doxepin (SINEQUAN) 10 MG capsule, TAKE 1 CAPSULE (10 MG TOTAL) BY MOUTH AT BEDTIME. (Patient not taking: Reported on 06/26/2018), Disp: 90 capsule, Rfl: 1 .  escitalopram (LEXAPRO) 10 MG tablet, Take 1 tablet (10 mg total) by mouth daily. (Patient not taking: Reported on 06/26/2018), Disp: 90 tablet, Rfl: 1  Allergies  Allergen Reactions  . Flagyl [Metronidazole Hcl] Hives    ROS   No other specific complaints in a complete review of systems (except as listed in HPI above).  Objective  Vitals:   06/26/18 1258  BP: 110/80  Pulse: 76  Temp: 98.1 F (36.7 C)  TempSrc: Oral  SpO2: 97%  Weight: 237 lb (107.5 kg)  Height: 5\' 3"  (1.6 m)    Body mass index is 41.98 kg/m.  Nursing Note and Vital Signs reviewed.  Physical Exam  Constitutional: Patient appears well-developed and well-nourished.  No distress.  HEENT: head atraumatic, normocephalic, pupils equal and reactive to light, EOM's intact, neck supple, oropharynx pink and moist without exudate Cardiovascular: Normal rate, regular rhythm, S1/S2 present.  Distal pulses incat Pulmonary/Chest: Effort normal. No respiratory distress or retractions. Abdominal: Soft and non-tender, bowel sounds present x4 quadrants.  Mild lower abdominal tenderness. No CVA Tenderness Neurological: She is alert and oriented to person, place, and time. No cranial nerve deficit. Coordination, balance, strength, speech and gait are normal.  Skin: Skin is warm and dry. No rash noted. No erythema.  Psychiatric: Patient has a normal mood and affect. behavior is normal. Judgment and thought content normal.   Assessment & Plan  Dysuria Will start macrobid for possible UTI-dosing and side effects discussed Home management, red flags and when to present for emergency care or RTC including fever >101.38F,  new/worsening/un-resolving symptoms,  reviewed and provided in AVS. - POCT urinalysis dipstick - Urine Culture; Future -  nitrofurantoin, macrocrystal-monohydrate, (MACROBID) 100 MG capsule; Take 1 capsule (100 mg total) by mouth 2 (two) times daily.  Dispense: 14 capsule; Refill: 0

## 2018-06-27 LAB — URINE CULTURE
MICRO NUMBER:: 91189775
SPECIMEN QUALITY:: ADEQUATE

## 2018-06-28 ENCOUNTER — Other Ambulatory Visit: Payer: Self-pay | Admitting: Internal Medicine

## 2018-06-28 DIAGNOSIS — E039 Hypothyroidism, unspecified: Secondary | ICD-10-CM

## 2018-07-02 ENCOUNTER — Encounter: Payer: Self-pay | Admitting: Internal Medicine

## 2018-07-03 ENCOUNTER — Ambulatory Visit: Payer: Managed Care, Other (non HMO) | Admitting: Internal Medicine

## 2018-07-07 ENCOUNTER — Other Ambulatory Visit: Payer: Self-pay | Admitting: Internal Medicine

## 2018-07-07 DIAGNOSIS — M818 Other osteoporosis without current pathological fracture: Secondary | ICD-10-CM

## 2018-07-07 MED ORDER — IBANDRONATE SODIUM 150 MG PO TABS: 150.0000 mg | ORAL_TABLET | ORAL | 1 refills | Status: DC

## 2018-07-09 ENCOUNTER — Other Ambulatory Visit: Payer: Self-pay | Admitting: Internal Medicine

## 2018-07-17 ENCOUNTER — Ambulatory Visit: Payer: Managed Care, Other (non HMO) | Admitting: Internal Medicine

## 2018-07-17 DIAGNOSIS — Z0289 Encounter for other administrative examinations: Secondary | ICD-10-CM

## 2018-07-28 ENCOUNTER — Other Ambulatory Visit: Payer: Self-pay | Admitting: Internal Medicine

## 2018-07-28 DIAGNOSIS — L309 Dermatitis, unspecified: Secondary | ICD-10-CM

## 2018-07-28 NOTE — Telephone Encounter (Signed)
Requested medication (s) are due for refill today -yes- overdue  Requested medication (s) are on the active medication list - yes  Future visit scheduled -yes  Last refill: Azelaic Acid: 10/05/15                 Tretinoin: 10/31/15  Notes to clinic: Patient request refills on Rx that are 62 years old. Rx sent for provider review.  Requested Prescriptions  Pending Prescriptions Disp Refills   Azelaic Acid (FINACEA) 15 % cream 50 g 0    Sig: APPLY 1/2 GRAM TWICE DAILY     Dermatology:  Acne preparations Passed - 07/28/2018  3:14 PM      Passed - Valid encounter within last 12 months    Recent Outpatient Visits          1 month ago [place], Delphia Grates, NP   1 month ago Acquired hypothyroidism   [place], Thomas L, MD   7 months ago Acquired hypothyroidism   [place], Thomas L, MD   1 year ago Eczema, unspecified type   [place], Thomas L, MD   1 year ago Other specified hypothyroidism   [place], MD      Future Appointments            In 1 week Janith Lima, MD [facility]          tretinoin (RETIN-A) 0.025 % cream 45 g 0    Sig: Apply topically at bedtime.     Dermatology:  Acne preparations Passed - 07/28/2018  3:14 PM      Passed - Valid encounter within last 12 months    Recent Outpatient Visits          1 month ago [place], Delphia Grates, NP   1 month ago Acquired hypothyroidism   [place], Thomas L, MD   7 months ago Acquired hypothyroidism   [place], Thomas L, MD   1 year ago Eczema, unspecified type   [place], Thomas L, MD   1 year ago Other specified hypothyroidism   [place], MD      Future Appointments            In 1 week Ronnald Ramp Arvid Right, MD [facility] [place], [facility]            Requested Prescriptions  Pending Prescriptions Disp Refills   Azelaic Acid (FINACEA) 15 % cream 50 g 0    Sig: APPLY 1/2 GRAM TWICE DAILY     Dermatology:  Acne preparations Passed - 07/28/2018  3:14 PM      Passed - Valid encounter within last 12 months    Recent Outpatient Visits          1 month ago [place], [place], NP   1 month ago Acquired hypothyroidism   [place], Thomas L, MD   7 months ago Acquired hypothyroidism   [facility], MD   1 year ago Eczema, unspecified type   [place], Thomas L, MD  1 year ago Other specified hypothyroidism   [place], MD      Future Appointments            In 1 week Janith Lima, MD [facility]          tretinoin (RETIN-A) 0.025 % cream 45 g 0    Sig: Apply topically at bedtime.     Dermatology:  Acne preparations Passed - 07/28/2018  3:14 PM      Passed - Valid encounter within last 12 months    Recent Outpatient Visits          1 month ago [place], Delphia Grates, NP   1 month ago Acquired hypothyroidism   [place], MD   7 months ago Acquired hypothyroidism   [place], Thomas L, MD   1 year ago Eczema, unspecified type   [place], Thomas L, MD   1 year ago Other specified hypothyroidism   [place], MD      Future Appointments            In 1 week Ronnald Ramp Arvid Right, MD [place], [facility]

## 2018-07-28 NOTE — Telephone Encounter (Signed)
Copied from [place] [id number]. Topic: Quick Communication - See Telephone Encounter >> Jul 28, 2018  2:55 PM Ivar Drape wrote: CRM for notification. See Telephone encounter for: 07/28/18. Patient would like refills on the following medications and have them sent to her preferred pharmacy [facility] [address] [place], [place]:  1) Azelaic Acid (FINACEA) 15 % cream  2) tretinoin (RETIN-A) 0.025 % cream

## 2018-07-30 MED ORDER — AZELAIC ACID 15 % EX GEL: CUTANEOUS | 2 refills | Status: AC

## 2018-07-30 MED ORDER — TRETINOIN 0.025 % EX CREA: TOPICAL_CREAM | Freq: Every day | CUTANEOUS | 2 refills | Status: DC

## 2018-07-31 ENCOUNTER — Inpatient Hospital Stay: Payer: Managed Care, Other (non HMO)

## 2018-07-31 ENCOUNTER — Encounter: Payer: Self-pay | Admitting: Hematology & Oncology

## 2018-07-31 ENCOUNTER — Other Ambulatory Visit: Payer: Self-pay

## 2018-07-31 ENCOUNTER — Inpatient Hospital Stay: Payer: Managed Care, Other (non HMO) | Attending: Hematology & Oncology | Admitting: Hematology & Oncology

## 2018-07-31 VITALS — BP 129/60 | HR 86 | Temp 97.9°F | Resp 18 | Wt 235.6 lb

## 2018-07-31 DIAGNOSIS — D508 Other iron deficiency anemias: Secondary | ICD-10-CM

## 2018-07-31 DIAGNOSIS — Z79899 Other long term (current) drug therapy: Secondary | ICD-10-CM

## 2018-07-31 DIAGNOSIS — K219 Gastro-esophageal reflux disease without esophagitis: Secondary | ICD-10-CM

## 2018-07-31 DIAGNOSIS — D509 Iron deficiency anemia, unspecified: Secondary | ICD-10-CM

## 2018-07-31 DIAGNOSIS — D500 Iron deficiency anemia secondary to blood loss (chronic): Secondary | ICD-10-CM

## 2018-07-31 LAB — CBC WITH DIFFERENTIAL (CANCER CENTER ONLY)
Abs Immature Granulocytes: 0.02 10*3/uL (ref 0.00–0.07)
Basophils Absolute: 0 10*3/uL (ref 0.0–0.1)
Basophils Relative: 0 %
Eosinophils Absolute: 0.2 10*3/uL (ref 0.0–0.5)
Eosinophils Relative: 2 %
HCT: 43.4 % (ref 36.0–46.0)
Hemoglobin: 13.7 g/dL (ref 12.0–15.0)
Immature Granulocytes: 0 %
Lymphocytes Relative: 13 %
Lymphs Abs: 1.2 10*3/uL (ref 0.7–4.0)
MCH: 28.8 pg (ref 26.0–34.0)
MCHC: 31.6 g/dL (ref 30.0–36.0)
MCV: 91.2 fL (ref 80.0–100.0)
Monocytes Absolute: 0.8 10*3/uL (ref 0.1–1.0)
Monocytes Relative: 8 %
Neutro Abs: 7.1 10*3/uL (ref 1.7–7.7)
Neutrophils Relative %: 77 %
Platelet Count: 313 10*3/uL (ref 150–400)
RBC: 4.76 MIL/uL (ref 3.87–5.11)
RDW: 13.6 % (ref 11.5–15.5)
WBC Count: 9.2 10*3/uL (ref 4.0–10.5)
nRBC: 0 % (ref 0.0–0.2)

## 2018-07-31 LAB — RETICULOCYTES
Immature Retic Fract: 8.3 % (ref 2.3–15.9)
RBC.: 4.76 MIL/uL (ref 3.87–5.11)
Retic Count, Absolute: 74.7 10*3/uL (ref 19.0–186.0)
Retic Ct Pct: 1.6 % (ref 0.4–3.1)

## 2018-07-31 NOTE — Progress Notes (Signed)
Hematology and Oncology Follow Up Visit  Olivia Mosley [id number] 11/04/1955 62 y.o. 07/31/2018   Principle Diagnosis:  Iron deficiency anemia  Current Therapy:   Oral Iron supplement daily IV iron as indicated - last received in June 2017   Interim History:  Olivia Mosley is here today for follow-up.  We saw her 6 months ago.  Since then, she still is having some issues.  I am not sure exactly what these issues are with respect to her being iron deficient.  She had iron studies done back in September.  Of course, no ferritin was done.  Her iron saturation was a bit low at 17.  She does have so many issues.  I am not sure exactly what is going on with her.  She is having back problems.  I think she is been getting some epidural injections.  She is worried about her hair coming out.  She is worried about vitamin D deficiency.  I told her that she probably needs to see her family doctor who might refer to a nutritional expert.  Overall, her performance status is ECOG 1.   Medications:  Allergies as of 07/31/2018      Reactions   Flagyl [metronidazole Hcl] Hives      Medication List        Accurate as of 07/31/18  3:30 PM. Always use your most recent med list.          Azelaic Acid 15 % cream APPLY 1/2 GRAM TWICE DAILY   azelastine 0.1 % nasal spray Commonly known as:  ASTELIN USE 2 SPRAYS IN EACH NOSTRIL TWICE DAILY   carisoprodol 350 MG tablet Commonly known as:  SOMA Take 350 mg by mouth 3 (three) times daily as needed.   clobetasol cream 0.05 % Commonly known as:  TEMOVATE APPLY TOPICALLY 2 (TWO) TIMES DAILY.   doxepin 10 MG capsule Commonly known as:  SINEQUAN TAKE 1 CAPSULE (10 MG TOTAL) BY MOUTH AT BEDTIME.   escitalopram 10 MG tablet Commonly known as:  LEXAPRO TAKE 1 TABLET BY MOUTH EVERY DAY   EUCRISA 2 % Oint Generic drug:  Crisaborole APPLY TO AFFECTED AREA TWICE A DAY   ibandronate 150 MG tablet Commonly known as:  BONIVA Take 1 tablet (150 mg  total) by mouth every 30 (thirty) days. Take in the morning with a full glass of water, on an empty stomach, and do not take anything else by mouth or lie down for the next 30 min.   Ketorolac Tromethamine 15.75 MG/SPRAY Soln Place into the nose as needed.   levothyroxine 75 MCG tablet Commonly known as:  SYNTHROID, LEVOTHROID TAKE 1 TABLET BY MOUTH EVERY DAY   meloxicam 15 MG tablet Commonly known as:  MOBIC Take 1 tablet by mouth daily as needed.   POLY-IRON 150 FORTE 150-0.025-1 MG Caps Generic drug:  Iron Polysacch [id number] TAKE 1 CAPSULE BY MOUTH EVERY DAY   RESTASIS 0.05 % ophthalmic emulsion Generic drug:  cycloSPORINE   traZODone 50 MG tablet Commonly known as:  DESYREL TAKE 2 TABLETS BY MOUTH AT BEDTIME AS NEEDED FOR SLEEP   tretinoin 0.025 % cream Commonly known as:  RETIN-A Apply topically at bedtime.   Vitamin D 50 MCG (2000 UT) tablet TAKE 1 TABLET BY MOUTH EVERY DAY   ZANTAC 75 75 MG tablet Generic drug:  ranitidine Take 75 mg by mouth 2 (two) times daily.       Allergies:  Allergies  Allergen Reactions  . Flagyl [  Metronidazole Hcl] Hives    Past Medical History, Surgical history, Social history, and Family History were reviewed and updated.  Review of Systems: Review of Systems  All other systems reviewed and are negative.   Physical Exam:  weight is 235 lb 10 oz (106.9 kg). Her oral temperature is 97.9 F (36.6 C). Her blood pressure is 129/60 and her pulse is 86. Her respiration is 18 and oxygen saturation is 98%.   Wt Readings from Last 3 Encounters:  07/31/18 235 lb 10 oz (106.9 kg)  06/26/18 237 lb (107.5 kg)  06/05/18 238 lb (108 kg)    Physical Exam  Constitutional: She is oriented to person, place, and time.  HENT:  Head: Normocephalic and atraumatic.  Mouth/Throat: Oropharynx is clear and moist.  Eyes: Pupils are equal, round, and reactive to light. EOM are normal.  Neck: Normal range of motion.  Cardiovascular: Normal  rate, regular rhythm and normal heart sounds.  Pulmonary/Chest: Effort normal and breath sounds normal.  Abdominal: Soft. Bowel sounds are normal.  Musculoskeletal: Normal range of motion. She exhibits no edema, tenderness or deformity.  Lymphadenopathy:    She has no cervical adenopathy.  Neurological: She is alert and oriented to person, place, and time.  Skin: Skin is warm and dry. No rash noted. No erythema.  Psychiatric: She has a normal mood and affect. Her behavior is normal. Judgment and thought content normal.  Vitals reviewed.    Lab Results  Component Value Date   WBC 9.2 07/31/2018   HGB 13.7 07/31/2018   HCT 43.4 07/31/2018   MCV 91.2 07/31/2018   PLT 313 07/31/2018   Lab Results  Component Value Date   FERRITIN 243.1 12/03/2017   IRON 60 06/05/2018   TIBC 269 01/23/2018   UIBC 203 01/23/2018   IRONPCTSAT 17.0 (L) 06/05/2018   Lab Results  Component Value Date   RETICCTPCT 1.6 07/31/2018   RBC 4.76 07/31/2018   RBC 4.76 07/31/2018   RETICCTABS 77.8 09/01/2015   No results found for: KPAFRELGTCHN, LAMBDASER, KAPLAMBRATIO No results found for: Kandis Cocking, IGMSERUM No results found for: Odetta Pink, SPEI   Chemistry      Component Value Date/Time   NA 140 01/23/2018 1531   NA 140 09/13/2017 1125   NA 138 09/01/2015 1117   K 4.4 01/23/2018 1531   K 4.2 09/13/2017 1125   K 4.4 09/01/2015 1117   CL 105 01/23/2018 1531   CL 104 09/13/2017 1125   CO2 28 01/23/2018 1531   CO2 27 09/13/2017 1125   CO2 18 (L) 09/01/2015 1117   BUN 19 01/23/2018 1531   BUN 17 09/13/2017 1125   BUN 29.4 (H) 09/01/2015 1117   CREATININE 0.80 01/23/2018 1531   CREATININE 0.7 09/13/2017 1125   CREATININE 0.9 09/01/2015 1117      Component Value Date/Time   CALCIUM 9.8 01/23/2018 1531   CALCIUM 9.6 09/13/2017 1125   CALCIUM 10.1 09/01/2015 1117   ALKPHOS 70 01/23/2018 1531   ALKPHOS 70 09/13/2017 1125   ALKPHOS 90  09/01/2015 1117   AST 29 01/23/2018 1531   AST 13 09/01/2015 1117   ALT 35 01/23/2018 1531   ALT 31 09/13/2017 1125   ALT 15 09/01/2015 1117   BILITOT 0.5 01/23/2018 1531   BILITOT 0.33 09/01/2015 1117      Impression and Plan: Olivia Mosley is a very pleasant 62 yo caucasian female with iron deficiency anemia.  We will see what her  iron studies look like.  I told her to try some vitamin B12.  I told her to try 5000 mcg pills.  Her labs look okay today.  I would be surprised if she was iron deficient.  We will still get her back in 6 months.  Hopefully she will be feeling better.  She will continue to take the oral supplement.  I will see her back in another 6 months.   Volanda Napoleon, MD 11/7/20193:30 PM

## 2018-08-01 ENCOUNTER — Telehealth: Payer: Self-pay | Admitting: *Deleted

## 2018-08-01 LAB — IRON AND TIBC
Iron: 92 ug/dL (ref 41–142)
Saturation Ratios: 29 % (ref 21–57)
TIBC: 319 ug/dL (ref 236–444)
UIBC: 226 ug/dL (ref 120–384)

## 2018-08-01 LAB — FERRITIN: Ferritin: 182 ng/mL (ref 11–307)

## 2018-08-01 NOTE — Telephone Encounter (Signed)
-----   Message from Volanda Napoleon, MD sent at 08/01/2018 12:16 PM EST ----- Call - iron level is ok!!!  Laurey Arrow

## 2018-08-01 NOTE — Telephone Encounter (Signed)
As noted below by Dr. Ennever, I informed patient of her iron level. She verbalized understanding. 

## 2018-08-06 ENCOUNTER — Encounter: Payer: Self-pay | Admitting: Internal Medicine

## 2018-08-06 ENCOUNTER — Ambulatory Visit (INDEPENDENT_AMBULATORY_CARE_PROVIDER_SITE_OTHER): Payer: Managed Care, Other (non HMO) | Admitting: Internal Medicine

## 2018-08-06 VITALS — BP 110/78 | HR 71 | Temp 98.0°F | Ht 63.0 in | Wt 232.8 lb

## 2018-08-06 DIAGNOSIS — M818 Other osteoporosis without current pathological fracture: Secondary | ICD-10-CM

## 2018-08-06 DIAGNOSIS — Z23 Encounter for immunization: Secondary | ICD-10-CM | POA: Diagnosis not present

## 2018-08-06 DIAGNOSIS — M8000XA Age-related osteoporosis with current pathological fracture, unspecified site, initial encounter for fracture: Secondary | ICD-10-CM

## 2018-08-06 DIAGNOSIS — J069 Acute upper respiratory infection, unspecified: Secondary | ICD-10-CM | POA: Diagnosis not present

## 2018-08-06 MED ORDER — IBANDRONATE SODIUM 150 MG PO TABS: 150.0000 mg | ORAL_TABLET | ORAL | 1 refills | Status: DC

## 2018-08-06 NOTE — Progress Notes (Signed)
Subjective:  Patient ID: SAMARAH HOGLE, female    DOB: 01-Jan-1956  Age: 62 y.o. MRN: [id number]  CC: URI   HPI MINNIE LEGROS presents for a one-week history of nonproductive cough, runny nose, nasal congestion, and fatigue.  All of the symptoms are improving.   She complains that she has been sleeping too much recently so has decided to stop taking some of her psychiatric meds.  She takes Manuela Neptune which has been prescribed by someone else.  Outpatient Medications Prior to Visit  Medication Sig Dispense Refill  . Azelaic Acid (FINACEA) 15 % cream APPLY 1/2 GRAM TWICE DAILY 50 g 2  . azelastine (ASTELIN) 0.1 % nasal spray USE 2 SPRAYS IN EACH NOSTRIL TWICE DAILY 30 mL 11  . Cholecalciferol (VITAMIN D) 2000 units tablet TAKE 1 TABLET BY MOUTH EVERY DAY 90 tablet 1  . EUCRISA 2 % OINT APPLY TO AFFECTED AREA TWICE A DAY 60 g 11  . Ketorolac Tromethamine 15.75 MG/SPRAY SOLN Place into the nose as needed.    Marland Kitchen levothyroxine (SYNTHROID, LEVOTHROID) 75 MCG tablet TAKE 1 TABLET BY MOUTH EVERY DAY 90 tablet 1  . POLY-IRON 150 FORTE 150-25-1 MG-MCG-MG CAPS TAKE 1 CAPSULE BY MOUTH EVERY DAY 90 each 2  . ranitidine (ZANTAC 75) 75 MG tablet Take 75 mg by mouth 2 (two) times daily.    . RESTASIS 0.05 % ophthalmic emulsion     . traZODone (DESYREL) 50 MG tablet TAKE 2 TABLETS BY MOUTH AT BEDTIME AS NEEDED FOR SLEEP 180 tablet 1  . tretinoin (RETIN-A) 0.025 % cream Apply topically at bedtime. 45 g 2  . carisoprodol (SOMA) 350 MG tablet Take 350 mg by mouth 3 (three) times daily as needed.  0  . clobetasol cream (TEMOVATE) 0.05 % APPLY TOPICALLY 2 (TWO) TIMES DAILY. 30 g 1  . ibandronate (BONIVA) 150 MG tablet Take 1 tablet (150 mg total) by mouth every 30 (thirty) days. Take in the morning with a full glass of water, on an empty stomach, and do not take anything else by mouth or lie down for the next 30 min. 6 tablet 1  . meloxicam (MOBIC) 15 MG tablet Take 1 tablet by mouth daily as needed.  0  . doxepin  (SINEQUAN) 10 MG capsule TAKE 1 CAPSULE (10 MG TOTAL) BY MOUTH AT BEDTIME. 90 capsule 1  . escitalopram (LEXAPRO) 10 MG tablet TAKE 1 TABLET BY MOUTH EVERY DAY 90 tablet 1   No facility-administered medications prior to visit.     ROS Review of Systems  Constitutional: Negative for chills, diaphoresis, fatigue and fever.  HENT: Positive for congestion, postnasal drip, rhinorrhea and sore throat. Negative for facial swelling, nosebleeds, sinus pressure and trouble swallowing.   Respiratory: Positive for cough. Negative for chest tightness, shortness of breath and wheezing.   Cardiovascular: Negative for chest pain, palpitations and leg swelling.  Gastrointestinal: Negative for abdominal pain, constipation, diarrhea and nausea.  Endocrine: Negative.   Genitourinary: Negative.  Negative for difficulty urinating.  Musculoskeletal: Positive for arthralgias and myalgias.  Skin: Negative.  Negative for color change and rash.  Neurological: Negative.  Negative for weakness and headaches.  Hematological: Negative for adenopathy. Does not bruise/bleed easily.  Psychiatric/Behavioral: Positive for sleep disturbance. Negative for dysphoric mood, self-injury and suicidal ideas. The patient is nervous/anxious. The patient is not hyperactive.     Objective:  BP 110/78 (BP Location: Left Arm, Patient Position: Sitting, Cuff Size: Large)   Pulse 71   Temp 98  F (36.7 C) (Oral)   Ht 5\' 3"  (1.6 m)   Wt 232 lb 12 oz (105.6 kg)   SpO2 98%   BMI 41.23 kg/m   BP Readings from Last 3 Encounters:  08/06/18 110/78  07/31/18 129/60  06/26/18 110/80    Wt Readings from Last 3 Encounters:  08/06/18 232 lb 12 oz (105.6 kg)  07/31/18 235 lb 10 oz (106.9 kg)  06/26/18 237 lb (107.5 kg)    Physical Exam  Constitutional: She is oriented to person, place, and time. No distress.  HENT:  Nose: No mucosal edema or rhinorrhea. Right sinus exhibits no maxillary sinus tenderness and no frontal sinus  tenderness. Left sinus exhibits no maxillary sinus tenderness and no frontal sinus tenderness.  Mouth/Throat: Oropharynx is clear and moist and mucous membranes are normal. No oropharyngeal exudate, posterior oropharyngeal edema, posterior oropharyngeal erythema or tonsillar abscesses.  Eyes: Conjunctivae are normal. No scleral icterus.  Neck: Normal range of motion. Neck supple. No JVD present. No thyromegaly present.  Cardiovascular: Normal rate, regular rhythm and normal heart sounds. Exam reveals no gallop.  No murmur heard. Pulmonary/Chest: Effort normal and breath sounds normal. No respiratory distress. She has no wheezes. She has no rales.  Abdominal: Soft. Normal appearance and bowel sounds are normal. She exhibits no mass. There is no hepatosplenomegaly. There is no tenderness.  Musculoskeletal: Normal range of motion. She exhibits no edema, tenderness or deformity.  Lymphadenopathy:    She has no cervical adenopathy.  Neurological: She is alert and oriented to person, place, and time.  Skin: Skin is warm and dry. She is not diaphoretic. No pallor.  Psychiatric: Her behavior is normal. Judgment and thought content normal. Her mood appears anxious. Her speech is not rapid and/or pressured, not delayed and not tangential. She is not slowed and not withdrawn. Thought content is not paranoid. Cognition and memory are normal. She does not exhibit a depressed mood. She expresses no homicidal and no suicidal ideation.  Vitals reviewed.   Lab Results  Component Value Date   WBC 9.2 07/31/2018   HGB 13.7 07/31/2018   HCT 43.4 07/31/2018   PLT 313 07/31/2018   GLUCOSE 88 01/23/2018   CHOL 216 (H) 12/03/2017   TRIG 157.0 (H) 12/03/2017   HDL 45.10 12/03/2017   LDLDIRECT 170.0 07/22/2017   LDLCALC 140 (H) 12/03/2017   ALT 35 01/23/2018   AST 29 01/23/2018   NA 140 01/23/2018   K 4.4 01/23/2018   CL 105 01/23/2018   CREATININE 0.80 01/23/2018   BUN 19 01/23/2018   CO2 28 01/23/2018    TSH 2.46 06/05/2018   INR 1.0 10/19/2015   HGBA1C 5.7 12/03/2017    Dg Humerus Right  Result Date: 06/06/2018 CLINICAL DATA:  Generalized pain for 1 year. EXAM: RIGHT HUMERUS - 2+ VIEW COMPARISON:  None. FINDINGS: Negative for a fracture. Alignment at the right shoulder and right elbow are grossly normal. Mild degenerative changes at the right [facility] joint. Small loose bodies along the lateral aspect of the right humeral head could be related to calcific tendinitis. IMPRESSION: No acute abnormality to the right humerus. Electronically Signed   By: Markus Daft M.D.   On: 06/06/2018 09:05    Assessment & Plan:   Ashantia was seen today for uri.  Diagnoses and all orders for this visit:  Need for influenza vaccination -     Flu Vaccine QUAD 36+ mos IM  Age-related osteoporosis with current pathological fracture, initial encounter  Other osteoporosis,  unspecified pathological fracture presence -     ibandronate (BONIVA) 150 MG tablet; Take 1 tablet (150 mg total) by mouth every 30 (thirty) days. Take in the morning with a full glass of water, on an empty stomach, and do not take anything else by mouth or lie down for the next 30 min.  Viral upper respiratory illness- Based on her symptoms and the fact that they are resolving quickly this is consistent with a viral URI that does not require antibiotic therapy.  If her symptoms persist for more than another week or 2 then we will consider starting an antibiotic.  For now she will take over-the-counter remedies for symptom relief.   I have discontinued Pricilla Riffle "[place] clobetasol cream, doxepin, carisoprodol, meloxicam, and escitalopram. I am also having her maintain her RESTASIS, azelastine, ranitidine, POLY-IRON 150 FORTE, traZODone, Vitamin D, Ketorolac Tromethamine, levothyroxine, EUCRISA, Azelaic Acid, tretinoin, and ibandronate.  Meds ordered this encounter  Medications  . ibandronate (BONIVA) 150 MG tablet    Sig: Take 1 tablet  (150 mg total) by mouth every 30 (thirty) days. Take in the morning with a full glass of water, on an empty stomach, and do not take anything else by mouth or lie down for the next 30 min.    Dispense:  6 tablet    Refill:  1     Follow-up: Return if symptoms worsen or fail to improve.  Scarlette Calico, MD

## 2018-08-06 NOTE — Patient Instructions (Signed)
Upper Respiratory Infection, Adult Most upper respiratory infections (URIs) are caused by a virus. A URI affects the nose, throat, and upper air passages. The most common type of URI is often called "the common cold." Follow these instructions at home:  Take medicines only as told by your doctor.  Gargle warm saltwater or take cough drops to comfort your throat as told by your doctor.  Use a warm mist humidifier or inhale steam from a shower to increase air moisture. This may make it easier to breathe.  Drink enough fluid to keep your pee (urine) clear or pale yellow.  Eat soups and other clear broths.  Have a healthy diet.  Rest as needed.  Go back to work when your fever is gone or your doctor says it is okay. ? You may need to stay home longer to avoid giving your URI to others. ? You can also wear a face mask and wash your hands often to prevent spread of the virus.  Use your inhaler more if you have asthma.  Do not use any tobacco products, including cigarettes, chewing tobacco, or electronic cigarettes. If you need help quitting, ask your doctor. Contact a doctor if:  You are getting worse, not better.  Your symptoms are not helped by medicine.  You have chills.  You are getting more short of breath.  You have brown or red mucus.  You have yellow or brown discharge from your nose.  You have pain in your face, especially when you bend forward.  You have a fever.  You have puffy (swollen) neck glands.  You have pain while swallowing.  You have white areas in the back of your throat. Get help right away if:  You have very bad or constant: ? Headache. ? Ear pain. ? Pain in your forehead, behind your eyes, and over your cheekbones (sinus pain). ? Chest pain.  You have long-lasting (chronic) lung disease and any of the following: ? Wheezing. ? Long-lasting cough. ? Coughing up blood. ? A change in your usual mucus.  You have a stiff neck.  You have  changes in your: ? Vision. ? Hearing. ? Thinking. ? Mood. This information is not intended to replace advice given to you by your health care provider. Make sure you discuss any questions you have with your health care provider. Document Released: 02/27/2008 Document Revised: 05/13/2016 Document Reviewed: 12/16/2013 Elsevier Interactive Patient Education  2018 Else[employer] Inc.  

## 2018-08-08 ENCOUNTER — Other Ambulatory Visit: Payer: Self-pay | Admitting: Internal Medicine

## 2018-08-08 DIAGNOSIS — L309 Dermatitis, unspecified: Secondary | ICD-10-CM

## 2018-08-25 ENCOUNTER — Encounter: Payer: Self-pay | Admitting: Internal Medicine

## 2018-10-23 IMAGING — DX DG HUMERUS 2V *R*
2 series · 2 of 2 positions shown · non-contrast
Comparison: None.

CLINICAL DATA: Generalized pain for 1 year.

EXAM:
RIGHT HUMERUS - 2+ VIEW

[humerus ap]
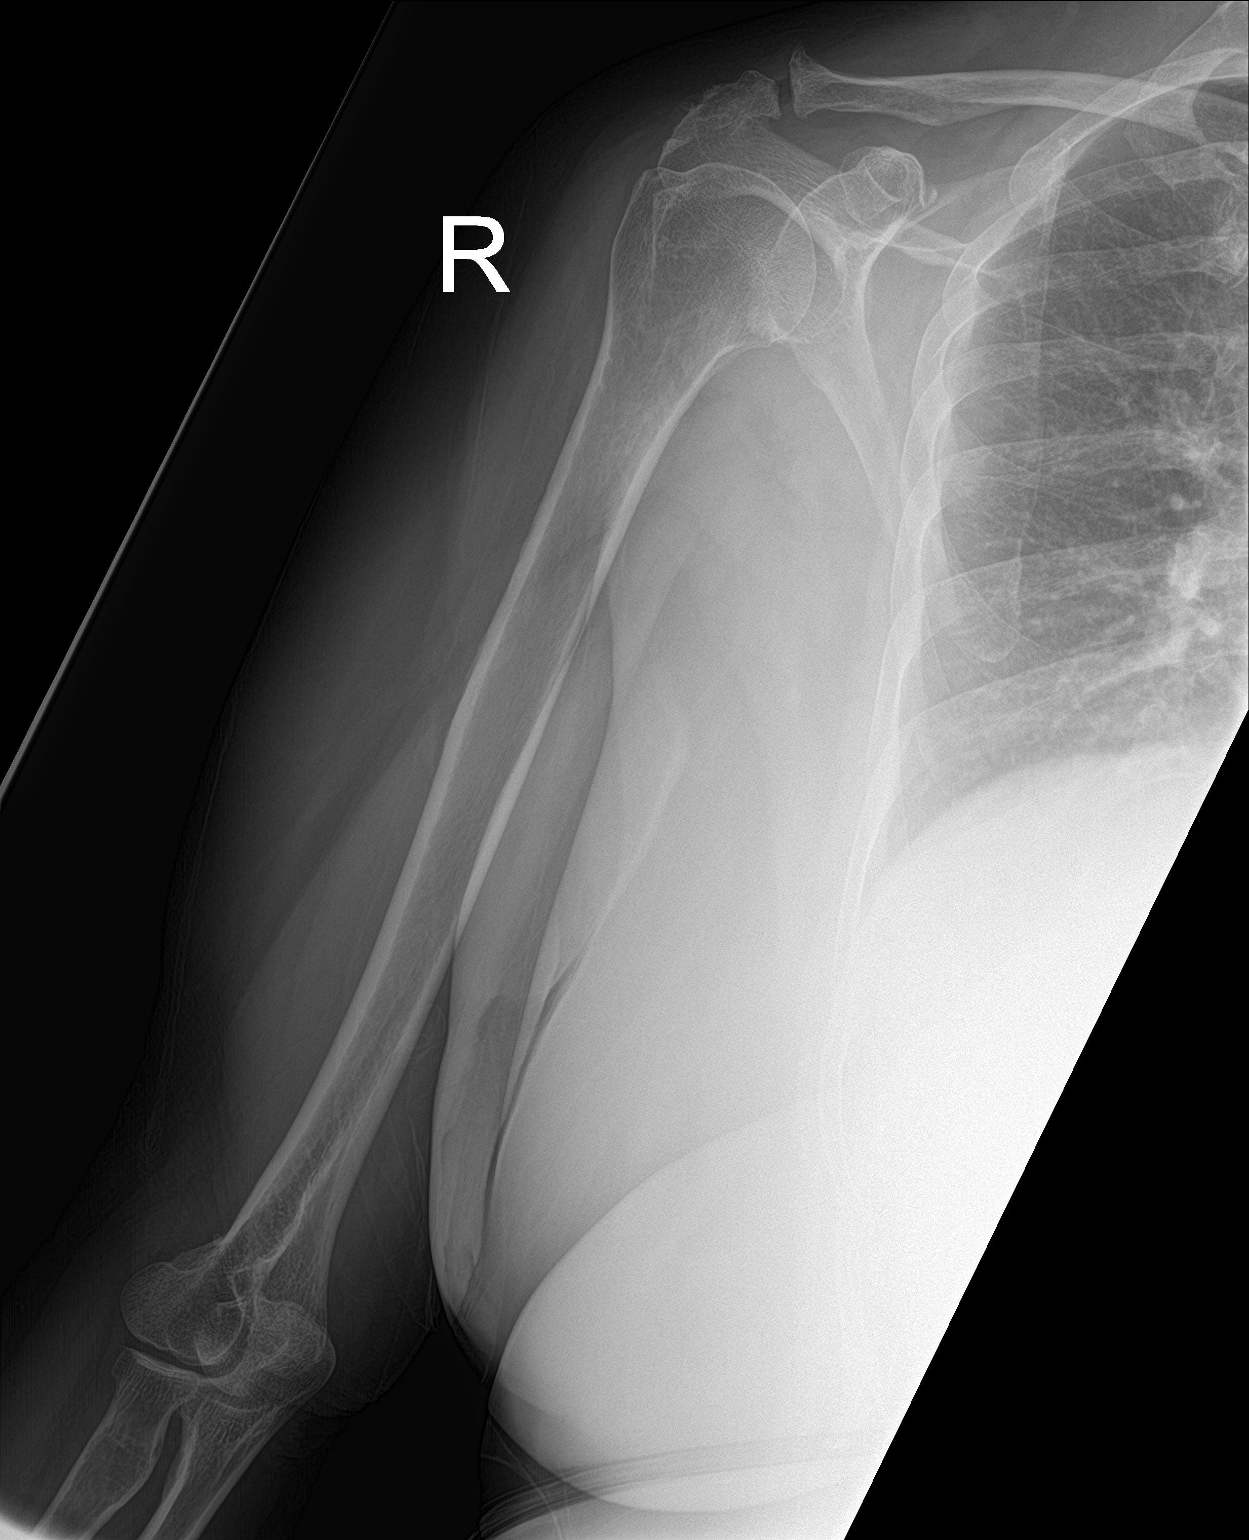

[humerus lat]
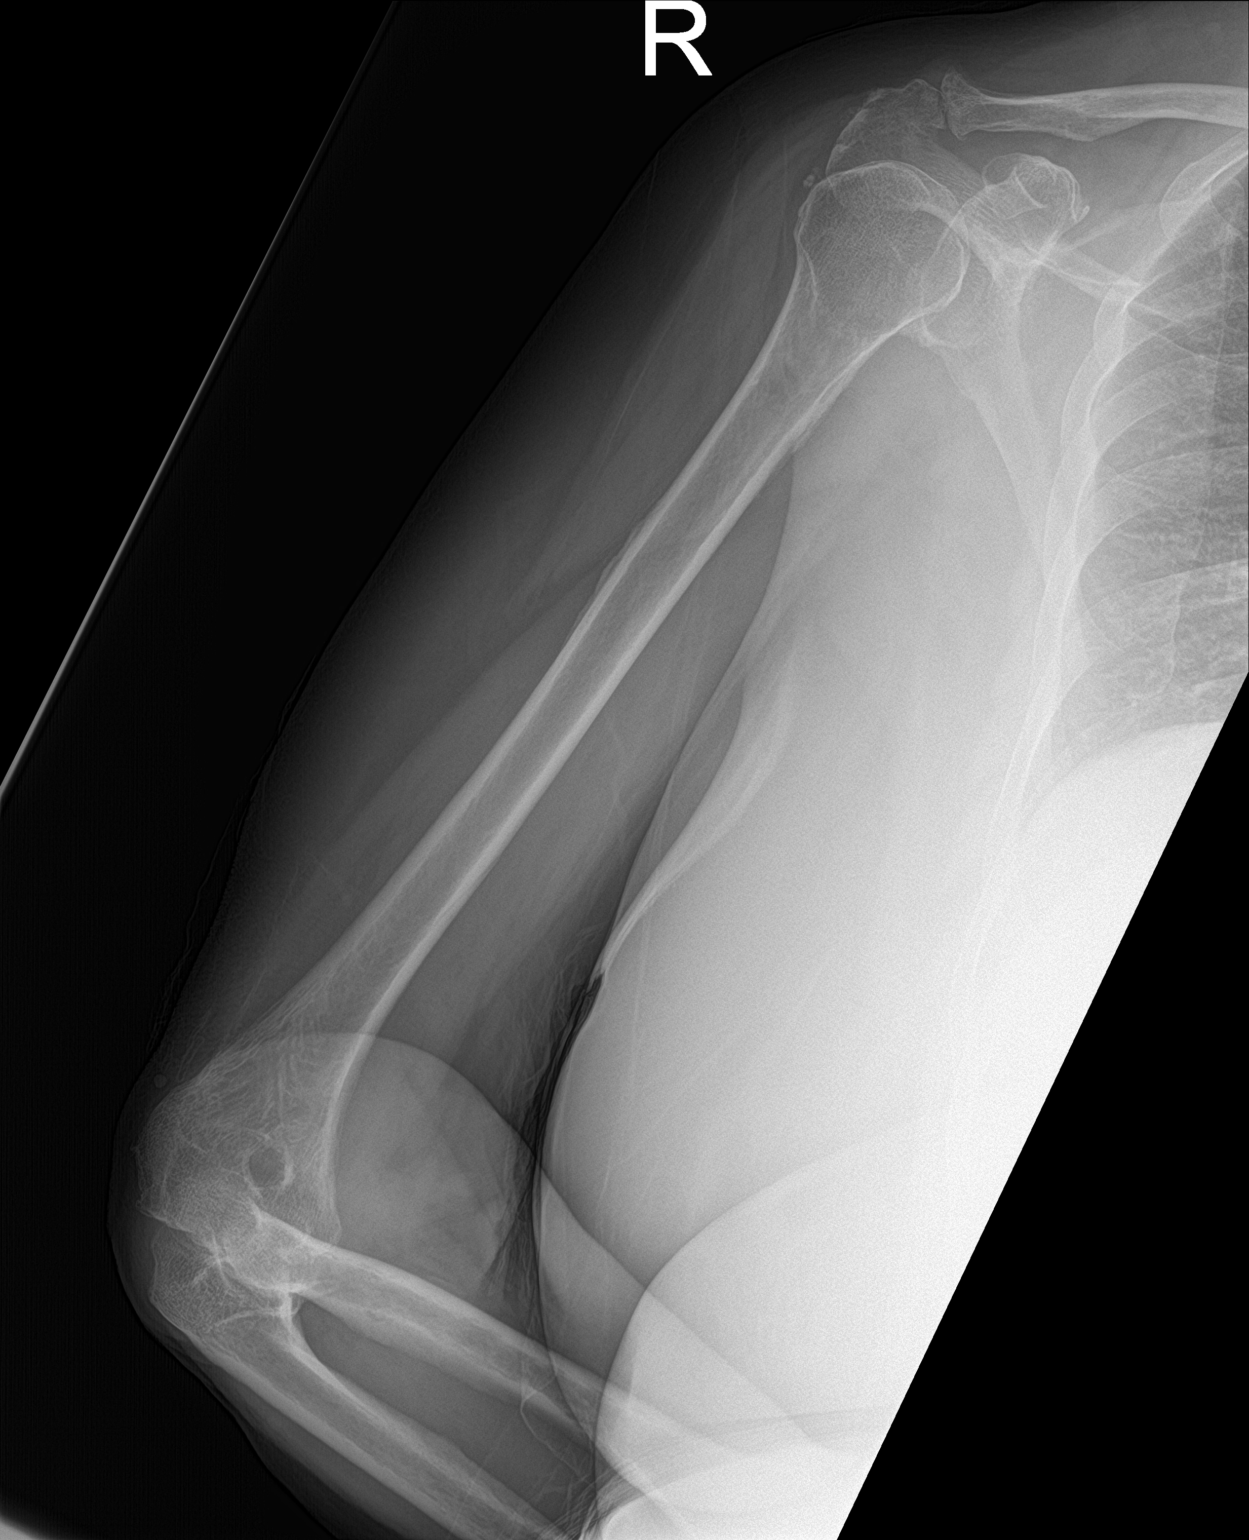

[2 of 2 positions shown; findings below may reference images not displayed]

FINDINGS: Negative for a fracture. Alignment at the right shoulder and right
elbow are grossly normal. Mild degenerative changes at the right AC
joint. Small loose bodies along the lateral aspect of the right
humeral head could be related to calcific tendinitis.
IMPRESSION: No acute abnormality to the right humerus.

## 2018-11-06 ENCOUNTER — Other Ambulatory Visit: Payer: Self-pay | Admitting: Hematology & Oncology

## 2018-11-06 DIAGNOSIS — D508 Other iron deficiency anemias: Secondary | ICD-10-CM

## 2018-11-13 ENCOUNTER — Other Ambulatory Visit: Payer: Self-pay | Admitting: Internal Medicine

## 2018-11-13 ENCOUNTER — Encounter: Payer: Self-pay | Admitting: Internal Medicine

## 2018-11-13 DIAGNOSIS — L708 Other acne: Secondary | ICD-10-CM

## 2018-11-13 MED ORDER — TRETINOIN 0.025 % EX CREA: TOPICAL_CREAM | Freq: Every day | CUTANEOUS | 2 refills | Status: AC

## 2018-11-18 ENCOUNTER — Other Ambulatory Visit: Payer: Self-pay | Admitting: Internal Medicine

## 2018-11-18 NOTE — Telephone Encounter (Signed)
ID: [id number] BIN: [id number] PCN: [id number]

## 2018-12-18 ENCOUNTER — Ambulatory Visit: Payer: Managed Care, Other (non HMO) | Admitting: Internal Medicine

## 2019-01-05 ENCOUNTER — Other Ambulatory Visit: Payer: Self-pay | Admitting: Internal Medicine

## 2019-01-05 DIAGNOSIS — E039 Hypothyroidism, unspecified: Secondary | ICD-10-CM

## 2019-01-28 ENCOUNTER — Encounter: Payer: Self-pay | Admitting: Internal Medicine

## 2019-01-28 ENCOUNTER — Ambulatory Visit (INDEPENDENT_AMBULATORY_CARE_PROVIDER_SITE_OTHER): Payer: Managed Care, Other (non HMO) | Admitting: Internal Medicine

## 2019-01-28 DIAGNOSIS — R1084 Generalized abdominal pain: Secondary | ICD-10-CM | POA: Diagnosis not present

## 2019-01-28 DIAGNOSIS — K581 Irritable bowel syndrome with constipation: Secondary | ICD-10-CM | POA: Diagnosis not present

## 2019-01-28 DIAGNOSIS — R739 Hyperglycemia, unspecified: Secondary | ICD-10-CM

## 2019-01-28 DIAGNOSIS — Z0000 Encounter for general adult medical examination without abnormal findings: Secondary | ICD-10-CM

## 2019-01-28 DIAGNOSIS — M8000XA Age-related osteoporosis with current pathological fracture, unspecified site, initial encounter for fracture: Secondary | ICD-10-CM

## 2019-01-28 DIAGNOSIS — D508 Other iron deficiency anemias: Secondary | ICD-10-CM

## 2019-01-28 DIAGNOSIS — E039 Hypothyroidism, unspecified: Secondary | ICD-10-CM

## 2019-01-28 MED ORDER — LINACLOTIDE 290 MCG PO CAPS: 290.0000 ug | ORAL_CAPSULE | Freq: Every day | ORAL | 1 refills | Status: AC

## 2019-01-28 NOTE — Progress Notes (Signed)
Virtual Visit via Video Note  I connected with Olivia Mosley on 01/28/19 at  3:30 PM EDT by a video enabled telemedicine application and verified that I am speaking with the correct person using two identifiers.   I discussed the limitations of evaluation and management by telemedicine and the availability of in person appointments. The patient expressed understanding and agreed to proceed.  History of Present Illness: She checked in for a virtual visit.  She was not willing to come in for an office visit due to the COVID-19 pandemic.  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  Her review of systems is extensively positive.  She complains of joint pain, lower extremity muscle weakness, low back and hip pain, feeling extremely stressed, dry/crapey skin, sagging skin, stabbing kidney pains, burning in her abdomen for several months, intermittent constipation, and anxiety.  She tells me she decided to stop taking Lexapro several months ago.  She does not want to start a new antidepressant.  She is controlling her anxiety with Xanax.  She is taking omeprazole and says that it has helped her abdominal symptoms some.  She denies weight loss, loss of appetite, odynophagia, or dysphagia.  She denies diarrhea, melena, or blood in her stool.  She denies SI or HI.  She is taking an oral B12 supplement.  She is also taking a vitamin D supplement but wants to know what her vitamin D level is.    Observations/Objective: Obese, anxious female.  She had a normal affect other than the anxiety.  There was no evidence of distress.  She was calm, cooperative, and appropriate.  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  It is unfortunate that she is not willing to take an antidepressant.  I asked her if she was willing to restart an antidepressant and she said no.  I will treat her for the IBS/constipation with Linzess.  I will also monitor her thyroid level, renal function, electrolytes, calcium level, PTH level, urinalysis, B12 level, and vitamin D level.  I encouraged her to improve her exercise regimen, practice some stress reduction activities with deep breathing and meditation, I encouraged her to stay connected to her friends and family as much as possible.   Follow Up Instructions: She agrees to come in to have the lab work completed.  She will let me know if she develops any new or worsening symptoms.  She agrees to try the new medication.  He tells me that she is seeing an orthopedist soon about her joint aches and back pain.  She will let me know if she changes her mind and wants to start an antidepressant.  She tells me that  1 day after this visit she is seeing a hematologist in [facility].  She agrees to keep that appointment and to let me know of any results.    I discussed the assessment and treatment plan with the patient. The patient was provided an opportunity to ask questions and all were answered. The patient agreed with the plan and demonstrated an understanding of the instructions.   The patient was advised to call back or seek an in-person evaluation if the symptoms worsen or if the condition fails to improve as anticipated.  I provided 30 minutes of non-face-to-face time during this encounter.   Scarlette Calico, MD

## 2019-01-29 ENCOUNTER — Inpatient Hospital Stay: Payer: Managed Care, Other (non HMO)

## 2019-01-29 ENCOUNTER — Other Ambulatory Visit (INDEPENDENT_AMBULATORY_CARE_PROVIDER_SITE_OTHER): Payer: Managed Care, Other (non HMO)

## 2019-01-29 ENCOUNTER — Encounter: Payer: Self-pay | Admitting: Family

## 2019-01-29 ENCOUNTER — Encounter: Payer: Self-pay | Admitting: Internal Medicine

## 2019-01-29 ENCOUNTER — Other Ambulatory Visit: Payer: Self-pay

## 2019-01-29 ENCOUNTER — Inpatient Hospital Stay: Payer: Managed Care, Other (non HMO) | Admitting: Oncology

## 2019-01-29 ENCOUNTER — Inpatient Hospital Stay: Payer: Managed Care, Other (non HMO) | Attending: Family | Admitting: Family

## 2019-01-29 VITALS — BP 111/70 | HR 80 | Temp 97.9°F | Resp 19 | Ht 62.0 in | Wt 226.4 lb

## 2019-01-29 DIAGNOSIS — M8000XA Age-related osteoporosis with current pathological fracture, unspecified site, initial encounter for fracture: Secondary | ICD-10-CM

## 2019-01-29 DIAGNOSIS — R739 Hyperglycemia, unspecified: Secondary | ICD-10-CM

## 2019-01-29 DIAGNOSIS — D509 Iron deficiency anemia, unspecified: Secondary | ICD-10-CM | POA: Diagnosis not present

## 2019-01-29 DIAGNOSIS — M6281 Muscle weakness (generalized): Secondary | ICD-10-CM | POA: Diagnosis not present

## 2019-01-29 DIAGNOSIS — R1084 Generalized abdominal pain: Secondary | ICD-10-CM

## 2019-01-29 DIAGNOSIS — M549 Dorsalgia, unspecified: Secondary | ICD-10-CM | POA: Diagnosis not present

## 2019-01-29 DIAGNOSIS — D508 Other iron deficiency anemias: Secondary | ICD-10-CM

## 2019-01-29 DIAGNOSIS — D500 Iron deficiency anemia secondary to blood loss (chronic): Secondary | ICD-10-CM

## 2019-01-29 DIAGNOSIS — E039 Hypothyroidism, unspecified: Secondary | ICD-10-CM

## 2019-01-29 DIAGNOSIS — Z0000 Encounter for general adult medical examination without abnormal findings: Secondary | ICD-10-CM | POA: Diagnosis not present

## 2019-01-29 LAB — URINALYSIS, ROUTINE W REFLEX MICROSCOPIC
Bilirubin Urine: NEGATIVE
Hgb urine dipstick: NEGATIVE
Ketones, ur: NEGATIVE
Leukocytes,Ua: NEGATIVE
Nitrite: NEGATIVE
RBC / HPF: NONE SEEN (ref 0–?)
Specific Gravity, Urine: 1.02 (ref 1.000–1.030)
Total Protein, Urine: NEGATIVE
Urine Glucose: NEGATIVE
Urobilinogen, UA: 0.2 (ref 0.0–1.0)
pH: 6.5 (ref 5.0–8.0)

## 2019-01-29 LAB — CBC WITH DIFFERENTIAL (CANCER CENTER ONLY)
Abs Immature Granulocytes: 0.02 10*3/uL (ref 0.00–0.07)
Basophils Absolute: 0 10*3/uL (ref 0.0–0.1)
Basophils Relative: 0 %
Eosinophils Absolute: 0.2 10*3/uL (ref 0.0–0.5)
Eosinophils Relative: 2 %
HCT: 42.3 % (ref 36.0–46.0)
Hemoglobin: 14 g/dL (ref 12.0–15.0)
Immature Granulocytes: 0 %
Lymphocytes Relative: 14 %
Lymphs Abs: 1.2 10*3/uL (ref 0.7–4.0)
MCH: 29.4 pg (ref 26.0–34.0)
MCHC: 33.1 g/dL (ref 30.0–36.0)
MCV: 88.9 fL (ref 80.0–100.0)
Monocytes Absolute: 0.8 10*3/uL (ref 0.1–1.0)
Monocytes Relative: 9 %
Neutro Abs: 6.5 10*3/uL (ref 1.7–7.7)
Neutrophils Relative %: 75 %
Platelet Count: 306 10*3/uL (ref 150–400)
RBC: 4.76 MIL/uL (ref 3.87–5.11)
RDW: 13.1 % (ref 11.5–15.5)
WBC Count: 8.6 10*3/uL (ref 4.0–10.5)
nRBC: 0 % (ref 0.0–0.2)

## 2019-01-29 LAB — CMP (CANCER CENTER ONLY)
ALT: 16 U/L (ref 0–44)
AST: 15 U/L (ref 15–41)
Albumin: 4.3 g/dL (ref 3.5–5.0)
Alkaline Phosphatase: 69 U/L (ref 38–126)
Anion gap: 7 (ref 5–15)
BUN: 17 mg/dL (ref 8–23)
CO2: 26 mmol/L (ref 22–32)
Calcium: 9.9 mg/dL (ref 8.9–10.3)
Chloride: 104 mmol/L (ref 98–111)
Creatinine: 0.88 mg/dL (ref 0.44–1.00)
GFR, Est AFR Am: >60 mL/min (ref >60)
GFR, Estimated: >60 mL/min (ref >60)
Glucose, Bld: 108 mg/dL — ABNORMAL HIGH (ref 70–99)
Potassium: 4.3 mmol/L (ref 3.5–5.1)
Sodium: 137 mmol/L (ref 135–145)
Total Bilirubin: 0.3 mg/dL (ref 0.3–1.2)
Total Protein: 7 g/dL (ref 6.5–8.1)

## 2019-01-29 LAB — LIPID PANEL
Cholesterol: 232 mg/dL — ABNORMAL HIGH (ref 0–200)
HDL: 42.2 mg/dL (ref >39.00)
LDL Cholesterol: 167 mg/dL — ABNORMAL HIGH (ref 0–99)
NonHDL: 189.96
Total CHOL/HDL Ratio: 6
Triglycerides: 116 mg/dL (ref 0.0–149.0)
VLDL: 23.2 mg/dL (ref 0.0–40.0)

## 2019-01-29 LAB — HEMOGLOBIN A1C: Hgb A1c MFr Bld: 5.9 % (ref 4.6–6.5)

## 2019-01-29 LAB — COMPREHENSIVE METABOLIC PANEL
ALT: 17 U/L (ref 0–35)
AST: 15 U/L (ref 0–37)
Albumin: 4.4 g/dL (ref 3.5–5.2)
Alkaline Phosphatase: 70 U/L (ref 39–117)
BUN: 18 mg/dL (ref 6–23)
CO2: 26 mEq/L (ref 19–32)
Calcium: 9.6 mg/dL (ref 8.4–10.5)
Chloride: 102 mEq/L (ref 96–112)
Creatinine, Ser: 0.81 mg/dL (ref 0.40–1.20)
GFR: 71.47 mL/min (ref >60.00)
Glucose, Bld: 83 mg/dL (ref 70–99)
Potassium: 3.9 mEq/L (ref 3.5–5.1)
Sodium: 137 mEq/L (ref 135–145)
Total Bilirubin: 0.3 mg/dL (ref 0.2–1.2)
Total Protein: 7.5 g/dL (ref 6.0–8.3)

## 2019-01-29 LAB — VITAMIN D 25 HYDROXY (VIT D DEFICIENCY, FRACTURES): VITD: 40.69 ng/mL (ref 30.00–100.00)

## 2019-01-29 LAB — LIPASE: Lipase: 27 U/L (ref 11.0–59.0)

## 2019-01-29 LAB — TSH: TSH: 1.64 u[IU]/mL (ref 0.35–4.50)

## 2019-01-29 NOTE — Progress Notes (Signed)
Hematology and Oncology Follow Up Visit  HADEN CAVENAUGH [id number] 11/09/1955 63 y.o. 01/29/2019   Principle Diagnosis:  Iron deficiency anemia  Current Therapy:   Oral Iron supplement daily IV iron as indicated   Interim History:  Ms. Whitmire is here today for follow-up. She is doing fairly well but has had issues with pain in back and knees along with muscle weakness. She has history of osteoarthritis.  She is getting synovial joint injections but has not noticed any improvement. She plans to follow-up with her orthopedist and see what other options she has.  She is taking her oral iron supplement daily.  She has not noted any episodes of bleeding.  She has noted that since starting the B 12 supplement her hair loss has improved. She has GERD and is waiting for Corona virus restrictions to be lifted for follow-up with her gastroenterologist.  No fever, chills, n/v, cough, rash, dizziness, SOB, chest pain, palpitations, abdominal pain or changes in bowel or bladder habits.  No swelling, numbness or tingling in her extremities.  No falls or syncopal episodes.  No lymphadenopathy noted on exam.  She is eating well and staying hydrated. Her weight is stable.   ECOG Performance Status: 1 - Symptomatic but completely ambulatory  Medications:  Allergies as of 01/29/2019      Reactions   Flagyl [metronidazole Hcl] Hives      Medication List       Accurate as of Jan 29, 2019  3:26 PM. If you have any questions, ask your nurse or doctor.        Azelaic Acid 15 % cream Commonly known as:  Finacea APPLY 1/2 GRAM TWICE DAILY   azelastine 0.1 % nasal spray Commonly known as:  ASTELIN USE 2 SPRAYS IN EACH NOSTRIL TWICE DAILY   [employer] D3 50 MCG (2000 UT) Caps Generic drug:  Cholecalciferol TAKE 1 CAPSULE BY MOUTH EVERY DAY   Eucrisa 2 % Oint Generic drug:  Crisaborole APPLY TO AFFECTED AREA TWICE A DAY   levothyroxine 75 MCG tablet Commonly known as:  SYNTHROID TAKE 1 TABLET BY  MOUTH EVERY DAY   linaclotide 290 MCG Caps capsule Commonly known as:  Linzess Take 1 capsule (290 mcg total) by mouth daily before breakfast.   Poly-Iron 150 Forte 150-0.025-1 MG Caps Generic drug:  Iron Polysacch Cmplx-B12-FA TAKE 1 CAPSULE BY MOUTH EVERY DAY   Restasis 0.05 % ophthalmic emulsion Generic drug:  cycloSPORINE   traZODone 50 MG tablet Commonly known as:  DESYREL TAKE 2 TABLETS BY MOUTH AT BEDTIME AS NEEDED FOR SLEEP   tretinoin 0.025 % cream Commonly known as:  RETIN-A Apply topically at bedtime.       Allergies:  Allergies  Allergen Reactions  . Flagyl [Metronidazole Hcl] Hives    Past Medical History, Surgical history, Social history, and Family History were reviewed and updated.  Review of Systems: All other 10 point review of systems is negative.   Physical Exam:  vitals were not taken for this visit.   Wt Readings from Last 3 Encounters:  08/06/18 232 lb 12 oz (105.6 kg)  07/31/18 235 lb 10 oz (106.9 kg)  06/26/18 237 lb (107.5 kg)    Ocular: Sclerae unicteric, pupils equal, round and reactive to light Ear-nose-throat: Oropharynx clear, dentition fair Lymphatic: No cervical or supraclavicular adenopathy Lungs no rales or rhonchi, good excursion bilaterally Heart regular rate and rhythm, no murmur appreciated Abd soft, nontender, positive bowel sounds, no liver or spleen tip palpated on  exam, no fluid wave  MSK no focal spinal tenderness, no joint edema Neuro: non-focal, well-oriented, appropriate affect Breasts: Deferred   Lab Results  Component Value Date   WBC 9.2 07/31/2018   HGB 13.7 07/31/2018   HCT 43.4 07/31/2018   MCV 91.2 07/31/2018   PLT 313 07/31/2018   Lab Results  Component Value Date   FERRITIN 182 07/31/2018   IRON 92 07/31/2018   TIBC 319 07/31/2018   UIBC 226 07/31/2018   IRONPCTSAT 29 07/31/2018   Lab Results  Component Value Date   RETICCTPCT 1.6 07/31/2018   RBC 4.76 07/31/2018   RBC 4.76 07/31/2018    RETICCTABS 77.8 09/01/2015   No results found for: KPAFRELGTCHN, LAMBDASER, KAPLAMBRATIO No results found for: IGGSERUM, IGA, IGMSERUM No results found for: Odetta Pink, SPEI   Chemistry      Component Value Date/Time   NA 137 01/29/2019 1227   NA 140 09/13/2017 1125   NA 138 09/01/2015 1117   K 3.9 01/29/2019 1227   K 4.2 09/13/2017 1125   K 4.4 09/01/2015 1117   CL 102 01/29/2019 1227   CL 104 09/13/2017 1125   CO2 26 01/29/2019 1227   CO2 27 09/13/2017 1125   CO2 18 (L) 09/01/2015 1117   BUN 18 01/29/2019 1227   BUN 17 09/13/2017 1125   BUN 29.4 (H) 09/01/2015 1117   CREATININE 0.81 01/29/2019 1227   CREATININE 0.80 01/23/2018 1531   CREATININE 0.7 09/13/2017 1125   CREATININE 0.9 09/01/2015 1117      Component Value Date/Time   CALCIUM 9.6 01/29/2019 1227   CALCIUM 9.6 09/13/2017 1125   CALCIUM 10.1 09/01/2015 1117   ALKPHOS 70 01/29/2019 1227   ALKPHOS 70 09/13/2017 1125   ALKPHOS 90 09/01/2015 1117   AST 15 01/29/2019 1227   AST 29 01/23/2018 1531   AST 13 09/01/2015 1117   ALT 17 01/29/2019 1227   ALT 35 01/23/2018 1531   ALT 31 09/13/2017 1125   ALT 15 09/01/2015 1117   BILITOT 0.3 01/29/2019 1227   BILITOT 0.5 01/23/2018 1531   BILITOT 0.33 09/01/2015 1117       Impression and Plan: Ms. Dottavio is a very pleasant 63 yo caucasian female with iron deficiency anemia.   We will see what her iron studies show and if we need to change her from oral to IV iron.  We will go ahead and plan to see her back in another 6 months.  She will contact our office with any questions or concerns. We can certainly see her sooner if need be.   Laverna Peace, NP 5/7/20203:26 PM

## 2019-01-30 LAB — PTH, INTACT AND CALCIUM
Calcium: 9.9 mg/dL (ref 8.6–10.4)
PTH: 13 pg/mL — ABNORMAL LOW (ref 14–64)

## 2019-01-30 LAB — HIV ANTIBODY (ROUTINE TESTING W REFLEX): HIV 1&2 Ab, 4th Generation: NONREACTIVE

## 2019-01-30 LAB — FERRITIN: Ferritin: 150 ng/mL (ref 11–307)

## 2019-01-30 LAB — IRON AND TIBC
Iron: 45 ug/dL (ref 41–142)
Saturation Ratios: 16 % — ABNORMAL LOW (ref 21–57)
TIBC: 289 ug/dL (ref 236–444)
UIBC: 243 ug/dL (ref 120–384)

## 2019-01-31 ENCOUNTER — Encounter: Payer: Self-pay | Admitting: Internal Medicine

## 2019-02-02 ENCOUNTER — Encounter: Payer: Self-pay | Admitting: Internal Medicine

## 2019-02-02 LAB — PTH, INTACT AND CALCIUM
Calcium: 9.9 mg/dL (ref 8.6–10.4)
PTH: 38 pg/mL (ref 14–64)

## 2019-02-03 ENCOUNTER — Other Ambulatory Visit: Payer: Self-pay | Admitting: Internal Medicine

## 2019-02-03 ENCOUNTER — Telehealth: Payer: Self-pay

## 2019-02-03 DIAGNOSIS — E785 Hyperlipidemia, unspecified: Secondary | ICD-10-CM

## 2019-02-03 DIAGNOSIS — R922 Inconclusive mammogram: Secondary | ICD-10-CM

## 2019-02-03 MED ORDER — ROSUVASTATIN CALCIUM 10 MG PO TABS: 10.0000 mg | ORAL_TABLET | Freq: Every day | ORAL | 1 refills | Status: DC

## 2019-02-03 NOTE — Telephone Encounter (Signed)
Brandi called back with the orders management fax number. # [phone]

## 2019-02-03 NOTE — Telephone Encounter (Signed)
Copied from [place] [phone]. Topic: General - Other >> Jan 30, 2019  4:30 PM Yvette Rack wrote: Reason for CRM: Birdie Sons with [facility] called to request order for a bilateral diagnostic mammogram with ultrasound. Cb# [phone] direct >> Jan 30, 2019  4:51 PM Morey Hummingbird wrote: Last mammogram in 2018

## 2019-02-03 NOTE — Telephone Encounter (Signed)
This has been faxed.

## 2019-02-03 NOTE — Telephone Encounter (Signed)
Order has been entered and given to PCP for signature.

## 2019-02-04 ENCOUNTER — Other Ambulatory Visit: Payer: Self-pay

## 2019-02-04 ENCOUNTER — Telehealth: Payer: Self-pay | Admitting: Hematology & Oncology

## 2019-02-04 ENCOUNTER — Inpatient Hospital Stay: Payer: Managed Care, Other (non HMO)

## 2019-02-04 VITALS — BP 128/71 | HR 81 | Temp 98.9°F | Resp 18

## 2019-02-04 DIAGNOSIS — D509 Iron deficiency anemia, unspecified: Secondary | ICD-10-CM | POA: Diagnosis not present

## 2019-02-04 MED ORDER — SODIUM CHLORIDE 0.9 % IV SOLN
510.0000 mg | Freq: Once | INTRAVENOUS | Status: AC
  Administered 2019-02-04: 510 mg via INTRAVENOUS
  Filled 2019-02-04: qty 17

## 2019-02-04 MED ORDER — SODIUM CHLORIDE 0.9 % IV SOLN
Freq: Once | INTRAVENOUS | Status: AC
  Administered 2019-02-04: 13:00:00 via INTRAVENOUS
  Filled 2019-02-04: qty 250

## 2019-02-04 NOTE — Patient Instructions (Signed)

## 2019-02-04 NOTE — Telephone Encounter (Signed)
Appt scheduled per 5/8 result note

## 2019-02-10 ENCOUNTER — Encounter: Payer: Self-pay | Admitting: Internal Medicine

## 2019-02-11 ENCOUNTER — Other Ambulatory Visit: Payer: Self-pay | Admitting: Internal Medicine

## 2019-02-11 DIAGNOSIS — F418 Other specified anxiety disorders: Secondary | ICD-10-CM

## 2019-02-11 MED ORDER — ALPRAZOLAM 0.5 MG PO TABS: 0.5000 mg | ORAL_TABLET | Freq: Two times a day (BID) | ORAL | 3 refills | Status: DC | PRN

## 2019-02-11 MED ORDER — ESCITALOPRAM OXALATE 10 MG PO TABS: 10.0000 mg | ORAL_TABLET | Freq: Every day | ORAL | 1 refills | Status: DC

## 2019-02-15 ENCOUNTER — Other Ambulatory Visit: Payer: Self-pay | Admitting: Internal Medicine

## 2019-03-04 ENCOUNTER — Encounter: Payer: Self-pay | Admitting: Internal Medicine

## 2019-04-02 ENCOUNTER — Other Ambulatory Visit: Payer: Self-pay | Admitting: Internal Medicine

## 2019-04-02 DIAGNOSIS — E039 Hypothyroidism, unspecified: Secondary | ICD-10-CM

## 2019-04-07 ENCOUNTER — Other Ambulatory Visit: Payer: Self-pay | Admitting: Internal Medicine

## 2019-04-24 ENCOUNTER — Ambulatory Visit (INDEPENDENT_AMBULATORY_CARE_PROVIDER_SITE_OTHER): Payer: Managed Care, Other (non HMO) | Admitting: Family

## 2019-04-24 ENCOUNTER — Encounter: Payer: Self-pay | Admitting: Family

## 2019-04-24 ENCOUNTER — Other Ambulatory Visit: Payer: Self-pay

## 2019-04-24 VITALS — BP 126/78 | HR 93 | Temp 98.2°F | Ht 62.0 in | Wt 226.0 lb

## 2019-04-24 DIAGNOSIS — L723 Sebaceous cyst: Secondary | ICD-10-CM | POA: Diagnosis not present

## 2019-04-24 DIAGNOSIS — L089 Local infection of the skin and subcutaneous tissue, unspecified: Secondary | ICD-10-CM

## 2019-04-24 MED ORDER — DOXYCYCLINE HYCLATE 100 MG PO TABS: 100.0000 mg | ORAL_TABLET | Freq: Two times a day (BID) | ORAL | 0 refills | Status: DC

## 2019-04-24 NOTE — Progress Notes (Signed)
Olivia Mosley is a 63 y.o. female with the following history as recorded in [employer]:  Patient Active Problem List   Diagnosis Date Noted  . Generalized abdominal pain 01/28/2019  . Osteoporosis 06/17/2018  . Estrogen deficiency 12/03/2017  . Screening for cervical cancer 12/03/2017  . Idiopathic gout 12/03/2017  . IBS (irritable bowel syndrome) 05/03/2016  . Visit for screening mammogram 10/31/2015  . Allergic rhinitis due to animal hair and dander 04/20/2015  . Carpal tunnel syndrome 11/10/2014  . Gastroesophageal reflux disease without esophagitis 07/09/2014  . Vitamin D deficiency 07/09/2014  . Anemia, iron deficiency 03/10/2013  . Back pain, chronic 03/10/2013  . Insomnia 03/14/2012  . Eczema 03/14/2012  . Routine health maintenance 03/14/2012  . Hyperglycemia 01/27/2012  . Hyperlipidemia with target LDL less than 130   . Hypothyroidism   . Depression with anxiety   . Combined fat and carbohydrate induced hyperlipemia 06/24/2009  . Recurrent major depressive episodes ([place]) 09/29/2007    Current Outpatient Medications  Medication Sig Dispense Refill  . ALPRAZolam (XANAX) 0.5 MG tablet Take 1 tablet (0.5 mg total) by mouth 2 (two) times daily as needed for anxiety. 60 tablet 3  . Ascorbic Acid (VITAMIN C) 100 MG tablet Take by mouth.    . Azelaic Acid (FINACEA) 15 % cream APPLY 1/2 GRAM TWICE DAILY 50 g 2  . azelastine (ASTELIN) 0.1 % nasal spray USE 2 SPRAYS IN EACH NOSTRIL TWICE DAILY 30 mL 11  . b complex vitamins capsule Take by mouth.    . clobetasol cream (TEMOVATE) 0.05 % APPLY TOPICALLY 2 (TWO) TIMES DAILY. 30 g 1  . CVS D3 50 MCG (2000 UT) CAPS TAKE 1 CAPSULE BY MOUTH EVERY DAY 100 capsule 0  . Cyanocobalamin 5000 MCG/ML LIQD Place under the tongue.    . escitalopram (LEXAPRO) 10 MG tablet Take 1 tablet (10 mg total) by mouth daily. 90 tablet 1  . EUCRISA 2 % OINT APPLY TO AFFECTED AREA TWICE A DAY 60 g 11  . Ketorolac Tromethamine 15.75 MG/SPRAY SOLN Place into  the nose.    . levothyroxine (SYNTHROID) 75 MCG tablet TAKE 1 TABLET BY MOUTH EVERY DAY 90 tablet 0  . linaclotide (LINZESS) 290 MCG CAPS capsule Take 1 capsule (290 mcg total) by mouth daily before breakfast. 90 capsule 1  . Multiple Vitamin (MULTIVITAMIN) capsule Take by mouth.    Marland Kitchen POLY-IRON 150 FORTE 150-25-1 MG-MCG-MG CAPS TAKE 1 CAPSULE BY MOUTH EVERY DAY 90 each 2  . RESTASIS 0.05 % ophthalmic emulsion     . rosuvastatin (CRESTOR) 10 MG tablet Take 1 tablet (10 mg total) by mouth daily. 90 tablet 1  . tiZANidine (ZANAFLEX) 4 MG tablet Take by mouth. Take as directed    . traZODone (DESYREL) 50 MG tablet TAKE 2 TABLETS BY MOUTH AT BEDTIME AS NEEDED FOR SLEEP 180 tablet 1  . tretinoin (RETIN-A) 0.025 % cream Apply topically at bedtime. 45 g 2  . triamcinolone cream (KENALOG) 0.1 % 1 (ONE) APPLICATION TO EAR CANAL ENTRANCE DAILY PRN ITCHING AND IRRITATION    . doxycycline (VIBRA-TABS) 100 MG tablet Take 1 tablet (100 mg total) by mouth 2 (two) times daily. 20 tablet 0   No current facility-administered medications for this visit.     Allergies: Flagyl [metronidazole hcl]  Past Medical History:  Diagnosis Date  . Allergy    SEASONAL  . Anemia   . Anxiety   . Arthritis    low back pain - MRI revealed DJD/DDD  L3-4, L5-S1  . Chronic kidney disease    kidnetstone x2  . Depression 1980/1988   in-patient treatment   . Diabetes mellitus 2007  . Diverticulitis 1994  . GERD (gastroesophageal reflux disease)   . Hyperlipidemia   . Hypothyroidism     Past Surgical History:  Procedure Laterality Date  . APPENDECTOMY     1994  . BREAST SURGERY  2010   benign  . COLON SURGERY  1994   bowel resection    Family History  Problem Relation Age of Onset  . Depression Mother   . Stroke Mother   . COPD Mother   . Alcohol abuse Father   . Hyperlipidemia Father   . Heart disease Father   . Hypertension Father   . Breast cancer Sister   . Breast cancer Sister   . Kidney disease Sister    . Colon cancer Neg Hx     Social History   Tobacco Use  . Smoking status: Former Smoker    Packs/day: 0.25    Years: 26.00    Pack years: 6.50    Types: Cigarettes    Start date: 10/31/1975    Quit date: 10/30/2002    Years since quitting: 16.4  . Smokeless tobacco: Never Used  . Tobacco comment: quit 11 years ago  Substance Use Topics  . Alcohol use: No    Alcohol/week: 0.0 standard drinks    Comment: SOCIALLY    Subjective:  Patient presents with concerns for cyst on his back; no drainage but notes that area is painful; per patient, cystic area has been present x 3-4 years; states that in the past week, it has started to hurt- feels like symptoms have worsened over the week; has never seen a surgeon about having cyst removed;     Objective:  Vitals:   04/24/19 1407  BP: 126/78  Pulse: 93  Temp: 98.2 F (36.8 C)  TempSrc: Oral  SpO2: 96%  Weight: 226 lb (102.5 kg)  Height: 5\' 2"  (1.575 m)    General: Well developed, well nourished, in no acute distress  Skin : Warm and dry. Large sebaceous cyst in upper left back with pustular center Head: Normocephalic and atraumatic  Lungs: Respirations unlabored;  Neurologic: Alert and oriented; speech intact; face symmetrical;  Assessment:  1. Infected sebaceous cyst     Plan:  Rx for Doxycycline 100 mg bid x 10 days; apply moist heat to affected area 3-4 x per day; will refer to surgeon for further evaluation.  Patient will be transferring her primary care needs to provider in [place] as she lives there now.   No follow-ups on file.  Orders Placed This Encounter  Procedures  . Ambulatory referral to General Surgery    Referral Priority:   Routine    Referral Type:   Surgical    Referral Reason:   Specialty Services Required    Requested Specialty:   General Surgery    Number of Visits Requested:   1    Requested Prescriptions   Signed Prescriptions Disp Refills  . doxycycline (VIBRA-TABS) 100 MG tablet 20  tablet 0    Sig: Take 1 tablet (100 mg total) by mouth 2 (two) times daily.

## 2019-05-10 ENCOUNTER — Other Ambulatory Visit: Payer: Self-pay | Admitting: Internal Medicine

## 2019-05-21 ENCOUNTER — Other Ambulatory Visit: Payer: Self-pay | Admitting: Internal Medicine

## 2019-06-29 ENCOUNTER — Other Ambulatory Visit: Payer: Self-pay | Admitting: Internal Medicine

## 2019-06-29 DIAGNOSIS — E039 Hypothyroidism, unspecified: Secondary | ICD-10-CM

## 2019-07-10 LAB — HM MAMMOGRAPHY

## 2019-07-17 ENCOUNTER — Encounter: Payer: Self-pay | Admitting: Internal Medicine

## 2019-08-01 ENCOUNTER — Other Ambulatory Visit: Payer: Self-pay | Admitting: Internal Medicine

## 2019-08-01 DIAGNOSIS — E785 Hyperlipidemia, unspecified: Secondary | ICD-10-CM

## 2019-08-03 ENCOUNTER — Inpatient Hospital Stay (HOSPITAL_BASED_OUTPATIENT_CLINIC_OR_DEPARTMENT_OTHER): Payer: Managed Care, Other (non HMO) | Admitting: Family

## 2019-08-03 ENCOUNTER — Other Ambulatory Visit: Payer: Self-pay

## 2019-08-03 ENCOUNTER — Inpatient Hospital Stay: Payer: Managed Care, Other (non HMO) | Attending: Family

## 2019-08-03 VITALS — BP 118/61 | HR 88 | Temp 97.5°F | Resp 17 | Wt 232.1 lb

## 2019-08-03 DIAGNOSIS — R200 Anesthesia of skin: Secondary | ICD-10-CM | POA: Diagnosis not present

## 2019-08-03 DIAGNOSIS — D500 Iron deficiency anemia secondary to blood loss (chronic): Secondary | ICD-10-CM

## 2019-08-03 DIAGNOSIS — D509 Iron deficiency anemia, unspecified: Secondary | ICD-10-CM | POA: Insufficient documentation

## 2019-08-03 LAB — CBC WITH DIFFERENTIAL (CANCER CENTER ONLY)
Abs Immature Granulocytes: 0.06 10*3/uL (ref 0.00–0.07)
Basophils Absolute: 0 10*3/uL (ref 0.0–0.1)
Basophils Relative: 1 %
Eosinophils Absolute: 0.1 10*3/uL (ref 0.0–0.5)
Eosinophils Relative: 2 %
HCT: 42.8 % (ref 36.0–46.0)
Hemoglobin: 13.9 g/dL (ref 12.0–15.0)
Immature Granulocytes: 1 %
Lymphocytes Relative: 16 %
Lymphs Abs: 1.1 10*3/uL (ref 0.7–4.0)
MCH: 29.8 pg (ref 26.0–34.0)
MCHC: 32.5 g/dL (ref 30.0–36.0)
MCV: 91.8 fL (ref 80.0–100.0)
Monocytes Absolute: 0.5 10*3/uL (ref 0.1–1.0)
Monocytes Relative: 7 %
Neutro Abs: 5.1 10*3/uL (ref 1.7–7.7)
Neutrophils Relative %: 73 %
Platelet Count: 286 10*3/uL (ref 150–400)
RBC: 4.66 MIL/uL (ref 3.87–5.11)
RDW: 12.7 % (ref 11.5–15.5)
WBC Count: 7 10*3/uL (ref 4.0–10.5)
nRBC: 0 % (ref 0.0–0.2)

## 2019-08-03 LAB — RETICULOCYTES
Immature Retic Fract: 13.4 % (ref 2.3–15.9)
RBC.: 4.66 MIL/uL (ref 3.87–5.11)
Retic Count, Absolute: 71.3 10*3/uL (ref 19.0–186.0)
Retic Ct Pct: 1.5 % (ref 0.4–3.1)

## 2019-08-03 NOTE — Progress Notes (Addendum)
Hematology and Oncology Follow Up Visit  Olivia Mosley [id number] December 11, 1955 63 y.o. 08/03/2019   Principle Diagnosis:  Iron deficiency anemia  Current Therapy:   Oral Iron supplement daily IV iron as indicated   Interim History:  Olivia Mosley is here today for follow-up. She is doing well but does occasionally have mild fatigue and SOB with over exertion.  She has chronic generalized aches and pains in the joint due to osteoarthritis.  No falls or syncopal episodes to report.  No swelling in her extremities. She has intermittent numbness and tingling in her arms and hands.  No bleeding, bruising or petechiae.  No fever, chills, n/v, cough, rash, dizziness, chest pain, palpitations, abdominal pain or changes in bowel or bladder habits.  She takes Nexium for GERD.  She has maintained a good appetite and is staying well hydrated. Her weight is stable.   ECOG Performance Status: 1 - Symptomatic but completely ambulatory  Medications:  Allergies as of 08/03/2019      Reactions   Flagyl [metronidazole Hcl] Hives      Medication List       Accurate as of August 03, 2019  3:31 PM. If you have any questions, ask your nurse or doctor.        ALPRAZolam 0.5 MG tablet Commonly known as: Xanax Take 1 tablet (0.5 mg total) by mouth 2 (two) times daily as needed for anxiety.   Azelaic Acid 15 % cream Commonly known as: Finacea APPLY 1/2 GRAM TWICE DAILY   azelastine 0.1 % nasal spray Commonly known as: ASTELIN USE 2 SPRAYS IN EACH NOSTRIL TWICE DAILY   b complex vitamins capsule Take by mouth.   clobetasol cream 0.05 % Commonly known as: TEMOVATE APPLY TOPICALLY 2 (TWO) TIMES DAILY.   CVS D3 50 MCG (2000 UT) Caps Generic drug: Cholecalciferol TAKE 1 CAPSULE BY MOUTH EVERY DAY   Cyanocobalamin 5000 MCG/ML Liqd Place under the tongue.   doxycycline 100 MG tablet Commonly known as: VIBRA-TABS Take 1 tablet (100 mg total) by mouth 2 (two) times daily.   escitalopram 10  MG tablet Commonly known as: Lexapro Take 1 tablet (10 mg total) by mouth daily.   Eucrisa 2 % Oint Generic drug: Crisaborole APPLY TO AFFECTED AREA TWICE A DAY   Ketorolac Tromethamine 15.75 MG/SPRAY Soln Place into the nose.   levothyroxine 75 MCG tablet Commonly known as: SYNTHROID TAKE 1 TABLET BY MOUTH EVERY DAY   linaclotide 290 MCG Caps capsule Commonly known as: Linzess Take 1 capsule (290 mcg total) by mouth daily before breakfast.   multivitamin capsule Take by mouth.   Poly-Iron 150 Forte 150-0.025-1 MG Caps Generic drug: Iron Polysacch Cmplx-B12-FA TAKE 1 CAPSULE BY MOUTH EVERY DAY   Restasis 0.05 % ophthalmic emulsion Generic drug: cycloSPORINE   rosuvastatin 10 MG tablet Commonly known as: CRESTOR TAKE 1 TABLET BY MOUTH EVERY DAY   tiZANidine 4 MG tablet Commonly known as: ZANAFLEX Take by mouth. Take as directed   traZODone 50 MG tablet Commonly known as: DESYREL TAKE 2 TABLETS BY MOUTH AT BEDTIME AS NEEDED FOR SLEEP   tretinoin 0.025 % cream Commonly known as: RETIN-A Apply topically at bedtime.   triamcinolone cream 0.1 % Commonly known as: KENALOG 1 (ONE) APPLICATION TO EAR CANAL ENTRANCE DAILY PRN ITCHING AND IRRITATION   vitamin C 100 MG tablet Take by mouth.       Allergies:  Allergies  Allergen Reactions  . Flagyl [Metronidazole Hcl] Hives    Past Medical  History, Surgical history, Social history, and Family History were reviewed and updated.  Review of Systems: All other 10 point review of systems is negative.   Physical Exam:  vitals were not taken for this visit.   Wt Readings from Last 3 Encounters:  04/24/19 226 lb (102.5 kg)  01/29/19 226 lb 6.4 oz (102.7 kg)  08/06/18 232 lb 12 oz (105.6 kg)    Ocular: Sclerae unicteric, pupils equal, round and reactive to light Ear-nose-throat: Oropharynx clear, dentition fair Lymphatic: No cervical or supraclavicular adenopathy Lungs no rales or rhonchi, good excursion  bilaterally Heart regular rate and rhythm, no murmur appreciated Abd soft, nontender, positive bowel sounds, no liver or spleen tip palpated on exam, no fluid wave  MSK no focal spinal tenderness, no joint edema Neuro: non-focal, well-oriented, appropriate affect Breasts: Deferred   Lab Results  Component Value Date   WBC 7.0 08/03/2019   HGB 13.9 08/03/2019   HCT 42.8 08/03/2019   MCV 91.8 08/03/2019   PLT 286 08/03/2019   Lab Results  Component Value Date   FERRITIN 150 01/29/2019   IRON 45 01/29/2019   TIBC 289 01/29/2019   UIBC 243 01/29/2019   IRONPCTSAT 16 (L) 01/29/2019   Lab Results  Component Value Date   RETICCTPCT 1.5 08/03/2019   RBC 4.66 08/03/2019   RETICCTABS 77.8 09/01/2015   No results found for: KPAFRELGTCHN, LAMBDASER, KAPLAMBRATIO No results found for: IGGSERUM, IGA, IGMSERUM No results found for: Odetta Pink, SPEI   Chemistry      Component Value Date/Time   NA 137 01/29/2019 1517   NA 140 09/13/2017 1125   NA 138 09/01/2015 1117   K 4.3 01/29/2019 1517   K 4.2 09/13/2017 1125   K 4.4 09/01/2015 1117   CL 104 01/29/2019 1517   CL 104 09/13/2017 1125   CO2 26 01/29/2019 1517   CO2 27 09/13/2017 1125   CO2 18 (L) 09/01/2015 1117   BUN 17 01/29/2019 1517   BUN 17 09/13/2017 1125   BUN 29.4 (H) 09/01/2015 1117   CREATININE 0.88 01/29/2019 1517   CREATININE 0.7 09/13/2017 1125   CREATININE 0.9 09/01/2015 1117      Component Value Date/Time   CALCIUM 9.9 01/29/2019 1517   CALCIUM 9.6 09/13/2017 1125   CALCIUM 10.1 09/01/2015 1117   ALKPHOS 69 01/29/2019 1517   ALKPHOS 70 09/13/2017 1125   ALKPHOS 90 09/01/2015 1117   AST 15 01/29/2019 1517   AST 13 09/01/2015 1117   ALT 16 01/29/2019 1517   ALT 31 09/13/2017 1125   ALT 15 09/01/2015 1117   BILITOT 0.3 01/29/2019 1517   BILITOT 0.33 09/01/2015 1117       Impression and Plan: Olivia Mosley is a very pleasant 63yo caucasian female  with iron deficiency anemia.  We will see what her iron studies show and bring her back in for infusion if needed.  We will plan to see her back in another 6 months.  She will contact our office with any questions or concerns. We can certainly see her sooner if needed.   Laverna Peace, NP 11/9/20203:31 PM

## 2019-08-04 LAB — IRON AND TIBC
Iron: 73 ug/dL (ref 41–142)
Saturation Ratios: 26 % (ref 21–57)
TIBC: 276 ug/dL (ref 236–444)
UIBC: 203 ug/dL (ref 120–384)

## 2019-08-04 LAB — FERRITIN: Ferritin: 353 ng/mL — ABNORMAL HIGH (ref 11–307)

## 2019-09-16 ENCOUNTER — Other Ambulatory Visit: Payer: Self-pay | Admitting: Internal Medicine

## 2019-11-08 ENCOUNTER — Other Ambulatory Visit: Payer: Self-pay | Admitting: Internal Medicine

## 2019-11-08 DIAGNOSIS — F418 Other specified anxiety disorders: Secondary | ICD-10-CM

## 2019-11-15 ENCOUNTER — Other Ambulatory Visit: Payer: Self-pay | Admitting: Internal Medicine

## 2019-12-07 ENCOUNTER — Encounter: Payer: Self-pay | Admitting: Family

## 2019-12-08 ENCOUNTER — Telehealth: Payer: Self-pay | Admitting: Family

## 2019-12-08 NOTE — Telephone Encounter (Signed)
Called spoke with patient regarding appointments per 3/15 sch msg

## 2019-12-15 ENCOUNTER — Inpatient Hospital Stay: Payer: Managed Care, Other (non HMO) | Attending: Family

## 2019-12-15 ENCOUNTER — Other Ambulatory Visit: Payer: Self-pay

## 2019-12-15 ENCOUNTER — Inpatient Hospital Stay: Payer: Managed Care, Other (non HMO) | Admitting: Family

## 2019-12-15 DIAGNOSIS — D509 Iron deficiency anemia, unspecified: Secondary | ICD-10-CM

## 2019-12-15 LAB — RETICULOCYTES
Immature Retic Fract: 9 % (ref 2.3–15.9)
RBC.: 4.71 MIL/uL (ref 3.87–5.11)
Retic Count, Absolute: 66.4 10*3/uL (ref 19.0–186.0)
Retic Ct Pct: 1.4 % (ref 0.4–3.1)

## 2019-12-15 LAB — CBC WITH DIFFERENTIAL (CANCER CENTER ONLY)
Abs Immature Granulocytes: 0.01 10*3/uL (ref 0.00–0.07)
Basophils Absolute: 0 10*3/uL (ref 0.0–0.1)
Basophils Relative: 1 %
Eosinophils Absolute: 0.3 10*3/uL (ref 0.0–0.5)
Eosinophils Relative: 4 %
HCT: 42.8 % (ref 36.0–46.0)
Hemoglobin: 13.9 g/dL (ref 12.0–15.0)
Immature Granulocytes: 0 %
Lymphocytes Relative: 21 %
Lymphs Abs: 1.4 10*3/uL (ref 0.7–4.0)
MCH: 29.4 pg (ref 26.0–34.0)
MCHC: 32.5 g/dL (ref 30.0–36.0)
MCV: 90.7 fL (ref 80.0–100.0)
Monocytes Absolute: 0.6 10*3/uL (ref 0.1–1.0)
Monocytes Relative: 9 %
Neutro Abs: 4.7 10*3/uL (ref 1.7–7.7)
Neutrophils Relative %: 65 %
Platelet Count: 270 10*3/uL (ref 150–400)
RBC: 4.72 MIL/uL (ref 3.87–5.11)
RDW: 13.2 % (ref 11.5–15.5)
WBC Count: 7 10*3/uL (ref 4.0–10.5)
nRBC: 0 % (ref 0.0–0.2)

## 2019-12-16 LAB — IRON AND TIBC
Iron: 89 ug/dL (ref 41–142)
Saturation Ratios: 33 % (ref 21–57)
TIBC: 268 ug/dL (ref 236–444)
UIBC: 179 ug/dL (ref 120–384)

## 2019-12-16 LAB — FERRITIN: Ferritin: 334 ng/mL — ABNORMAL HIGH (ref 11–307)

## 2019-12-18 ENCOUNTER — Inpatient Hospital Stay: Payer: Managed Care, Other (non HMO) | Admitting: Family

## 2019-12-25 ENCOUNTER — Inpatient Hospital Stay: Payer: Managed Care, Other (non HMO) | Attending: Family | Admitting: Family

## 2019-12-25 ENCOUNTER — Other Ambulatory Visit: Payer: Self-pay

## 2019-12-25 DIAGNOSIS — D509 Iron deficiency anemia, unspecified: Secondary | ICD-10-CM

## 2019-12-25 NOTE — Progress Notes (Signed)
Hematology and Oncology Follow Up Visit  Olivia Mosley [id number] May 03, 1956 64 y.o. 12/25/2019   Principle Diagnosis:  Iron deficiency anemia  Current Therapy:        Oral Iron supplement daily IV iron as indicated   Interim History:  Olivia Mosley had a follow-up today over the phone. Her iron studies looked good last week with iron saturation 33% and ferritin 334. She has had a little bright red blood on her toilet tissue after straining with occasional constipation. No other blood loss has been noted. No bruising or petechiae.  She states that she has had joint/muscle discomfort and bumps on her knuckles and has seen neurology. She states that she is currently being worked up for [employer].  She has occasional episodes of SOB with over exertion (stairs) and will take breaks to rest when needed.  No fever, chills, n/v, cough, rash, dizziness, chest pain, palpitations, abdominal pain or changes in bowel or bladder habits.  No swelling, tenderness, numbness or tingling in her extremities at this time.  She states that her issues with joint and muscle discomfort have caused her some instability. She states that while having some construction done to her home she tripped over a tarp and fell. Thankfully she was not injured.  No other falls. No syncopal episodes to report.  She is trying to eat healthier and is staying well hydrated. Her weight is described as stable.   ECOG Performance Status: 0 - Asymptomatic  Medications:  Allergies as of 12/25/2019      Reactions   Flagyl [metronidazole Hcl] Hives      Medication List       Accurate as of December 25, 2019  2:13 PM. If you have any questions, ask your nurse or doctor.        Azelaic Acid 15 % cream Commonly known as: Finacea APPLY 1/2 GRAM TWICE DAILY   azelastine 0.1 % nasal spray Commonly known as: ASTELIN USE 2 SPRAYS IN EACH NOSTRIL TWICE DAILY   b complex vitamins capsule Take by mouth.   clobetasol cream 0.05 % Commonly  known as: TEMOVATE APPLY TOPICALLY 2 (TWO) TIMES DAILY.   CVS D3 50 MCG (2000 UT) Caps Generic drug: Cholecalciferol TAKE 1 CAPSULE BY MOUTH EVERY DAY   Cyanocobalamin 5000 MCG/ML Liqd Place under the tongue.   escitalopram 10 MG tablet Commonly known as: LEXAPRO TAKE 1 TABLET BY MOUTH EVERY DAY   levothyroxine 75 MCG tablet Commonly known as: SYNTHROID TAKE 1 TABLET BY MOUTH EVERY DAY   linaclotide 290 MCG Caps capsule Commonly known as: Linzess Take 1 capsule (290 mcg total) by mouth daily before breakfast.   multivitamin capsule Take by mouth.   Poly-Iron 150 Forte 150-0.025-1 MG Caps Generic drug: Iron Polysacch Cmplx-B12-FA TAKE 1 CAPSULE BY MOUTH EVERY DAY   Restasis 0.05 % ophthalmic emulsion Generic drug: cycloSPORINE   rosuvastatin 10 MG tablet Commonly known as: CRESTOR TAKE 1 TABLET BY MOUTH EVERY DAY   traZODone 50 MG tablet Commonly known as: DESYREL TAKE 2 TABLETS BY MOUTH AT BEDTIME AS NEEDED FOR SLEEP   tretinoin 0.025 % cream Commonly known as: RETIN-A Apply topically at bedtime.   triamcinolone cream 0.1 % Commonly known as: KENALOG 1 (ONE) APPLICATION TO EAR CANAL ENTRANCE DAILY PRN ITCHING AND IRRITATION   vitamin C 100 MG tablet Take by mouth.       Allergies:  Allergies  Allergen Reactions  . Flagyl [Metronidazole Hcl] Hives    Past Medical History, Surgical  history, Social history, and Family History were reviewed and updated.  Review of Systems: All other 10 point review of systems is negative.   Physical Exam:  vitals were not taken for this visit.   Wt Readings from Last 3 Encounters:  08/03/19 232 lb 1.9 oz (105.3 kg)  04/24/19 226 lb (102.5 kg)  01/29/19 226 lb 6.4 oz (102.7 kg)    Ocular: Sclerae unicteric, pupils equal, round and reactive to light Ear-nose-throat: Oropharynx clear, dentition fair Lymphatic: No cervical or supraclavicular adenopathy Lungs no rales or rhonchi, good excursion bilaterally Heart  regular rate and rhythm, no murmur appreciated Abd soft, nontender, positive bowel sounds, no liver or spleen tip palpated on exam, no fluid wave  MSK no focal spinal tenderness, no joint edema Neuro: non-focal, well-oriented, appropriate affect Breasts: Deferred   Lab Results  Component Value Date   WBC 7.0 12/15/2019   HGB 13.9 12/15/2019   HCT 42.8 12/15/2019   MCV 90.7 12/15/2019   PLT 270 12/15/2019   Lab Results  Component Value Date   FERRITIN 334 (H) 12/15/2019   IRON 89 12/15/2019   TIBC 268 12/15/2019   UIBC 179 12/15/2019   IRONPCTSAT 33 12/15/2019   Lab Results  Component Value Date   RETICCTPCT 1.4 12/15/2019   RBC 4.71 12/15/2019   RETICCTABS 77.8 09/01/2015   No results found for: KPAFRELGTCHN, LAMBDASER, KAPLAMBRATIO No results found for: IGGSERUM, IGA, IGMSERUM No results found for: Kathrynn Ducking, MSPIKE, SPEI   Chemistry      Component Value Date/Time   NA 137 01/29/2019 1517   NA 140 09/13/2017 1125   NA 138 09/01/2015 1117   K 4.3 01/29/2019 1517   K 4.2 09/13/2017 1125   K 4.4 09/01/2015 1117   CL 104 01/29/2019 1517   CL 104 09/13/2017 1125   CO2 26 01/29/2019 1517   CO2 27 09/13/2017 1125   CO2 18 (L) 09/01/2015 1117   BUN 17 01/29/2019 1517   BUN 17 09/13/2017 1125   BUN 29.4 (H) 09/01/2015 1117   CREATININE 0.88 01/29/2019 1517   CREATININE 0.81 01/29/2019 1227   CREATININE 0.7 09/13/2017 1125   CREATININE 0.9 09/01/2015 1117      Component Value Date/Time   CALCIUM 9.9 01/29/2019 1517   CALCIUM 9.6 09/13/2017 1125   CALCIUM 10.1 09/01/2015 1117   ALKPHOS 69 01/29/2019 1517   ALKPHOS 70 09/13/2017 1125   ALKPHOS 90 09/01/2015 1117   AST 15 01/29/2019 1517   AST 15 01/29/2019 1227   AST 13 09/01/2015 1117   ALT 16 01/29/2019 1517   ALT 17 01/29/2019 1227   ALT 31 09/13/2017 1125   ALT 15 09/01/2015 1117   BILITOT 0.3 01/29/2019 1517   BILITOT 0.3 01/29/2019 1227   BILITOT 0.33  09/01/2015 1117       Impression and Plan: Olivia Mosley is a very pleasant 64yo caucasian female with iron deficiency anemia. Iron studies look good. No iron needed at this time.  We will plan to see her back in another 6 months.  She will contact our office with any questions or concerns. We can certainly see her sooner if needed.   Laverna Peace, NP 4/2/20212:13 PM

## 2020-01-30 ENCOUNTER — Other Ambulatory Visit: Payer: Self-pay | Admitting: Internal Medicine

## 2020-01-30 DIAGNOSIS — E785 Hyperlipidemia, unspecified: Secondary | ICD-10-CM

## 2020-02-08 ENCOUNTER — Other Ambulatory Visit: Payer: Self-pay | Admitting: Internal Medicine

## 2020-02-08 DIAGNOSIS — E785 Hyperlipidemia, unspecified: Secondary | ICD-10-CM

## 2020-02-23 ENCOUNTER — Other Ambulatory Visit: Payer: Self-pay | Admitting: Family

## 2020-03-21 ENCOUNTER — Other Ambulatory Visit: Payer: Self-pay | Admitting: Internal Medicine

## 2020-03-21 DIAGNOSIS — F418 Other specified anxiety disorders: Secondary | ICD-10-CM

## 2020-06-24 ENCOUNTER — Inpatient Hospital Stay: Payer: Managed Care, Other (non HMO)

## 2020-06-24 ENCOUNTER — Inpatient Hospital Stay: Payer: Managed Care, Other (non HMO) | Admitting: Family

## 2020-07-06 ENCOUNTER — Inpatient Hospital Stay: Payer: Managed Care, Other (non HMO)

## 2020-07-06 ENCOUNTER — Inpatient Hospital Stay: Payer: Managed Care, Other (non HMO) | Admitting: Family

## 2020-07-12 ENCOUNTER — Encounter: Payer: Self-pay | Admitting: Family

## 2020-07-12 ENCOUNTER — Inpatient Hospital Stay: Payer: Managed Care, Other (non HMO) | Attending: Family

## 2020-07-12 ENCOUNTER — Other Ambulatory Visit: Payer: Self-pay

## 2020-07-12 ENCOUNTER — Inpatient Hospital Stay (HOSPITAL_BASED_OUTPATIENT_CLINIC_OR_DEPARTMENT_OTHER): Payer: Managed Care, Other (non HMO) | Admitting: Family

## 2020-07-12 VITALS — BP 125/73 | HR 77 | Temp 98.0°F | Resp 20 | Ht 62.21 in | Wt 240.0 lb

## 2020-07-12 DIAGNOSIS — D509 Iron deficiency anemia, unspecified: Secondary | ICD-10-CM

## 2020-07-12 LAB — CBC WITH DIFFERENTIAL (CANCER CENTER ONLY)
Abs Immature Granulocytes: 0.02 10*3/uL (ref 0.00–0.07)
Basophils Absolute: 0 10*3/uL (ref 0.0–0.1)
Basophils Relative: 1 %
Eosinophils Absolute: 0.2 10*3/uL (ref 0.0–0.5)
Eosinophils Relative: 3 %
HCT: 42.7 % (ref 36.0–46.0)
Hemoglobin: 13.8 g/dL (ref 12.0–15.0)
Immature Granulocytes: 0 %
Lymphocytes Relative: 19 %
Lymphs Abs: 1.6 10*3/uL (ref 0.7–4.0)
MCH: 29.7 pg (ref 26.0–34.0)
MCHC: 32.3 g/dL (ref 30.0–36.0)
MCV: 92 fL (ref 80.0–100.0)
Monocytes Absolute: 0.5 10*3/uL (ref 0.1–1.0)
Monocytes Relative: 7 %
Neutro Abs: 5.8 10*3/uL (ref 1.7–7.7)
Neutrophils Relative %: 70 %
Platelet Count: 287 10*3/uL (ref 150–400)
RBC: 4.64 MIL/uL (ref 3.87–5.11)
RDW: 13.6 % (ref 11.5–15.5)
WBC Count: 8.2 10*3/uL (ref 4.0–10.5)
nRBC: 0 % (ref 0.0–0.2)

## 2020-07-12 LAB — RETICULOCYTES
Immature Retic Fract: 11.4 % (ref 2.3–15.9)
RBC.: 4.67 MIL/uL (ref 3.87–5.11)
Retic Count, Absolute: 76.6 10*3/uL (ref 19.0–186.0)
Retic Ct Pct: 1.6 % (ref 0.4–3.1)

## 2020-07-12 NOTE — Progress Notes (Signed)
Hematology and Oncology Follow Up Visit  Olivia Mosley [id number] 15-Nov-1955 64 y.o. 07/12/2020   Principle Diagnosis:  Iron deficiency anemia  Current Therapy:        IV iron as indicated   Interim History:  Olivia Mosley is here today for follow-up. She is doing fairly well but has issues due to arthritis in her back. She has an appointment coming up with a new neuro spine specialist.  She has been swimming regularly for exercise.  She has fatigue at times and will take a break to rest when needed.  She has occasional episodes of mild SOB with exertion such as stairs.   No fever, chills, n/v, cough, rash, dizziness, chest pain, palpitations, abdominal pain or changes in bowel or bladder habits.  She will increase her fiber intake and activity to prevent/remedy constipation.  No episodes of bleeding. No bruising or petechiae.  No swelling, numbness or tingling in her extremities at this time. She has some numbness in her hands and arms that comes and goes. She states that Neurontin has with with this as well as her back pain.  No falls or syncopal episodes to report.  She has maintained a good appetite and is staying well hydrated. Her weight is stable.   ECOG Performance Status: 1 - Symptomatic but completely ambulatory  Medications:  Allergies as of 07/12/2020      Reactions   Flagyl [metronidazole Hcl] Hives      Medication List       Accurate as of July 12, 2020  3:29 PM. If you have any questions, ask your nurse or doctor.        Azelaic Acid 15 % cream Commonly known as: Finacea APPLY 1/2 GRAM TWICE DAILY   azelastine 0.1 % nasal spray Commonly known as: ASTELIN USE 2 SPRAYS IN EACH NOSTRIL TWICE DAILY   b complex vitamins capsule Take by mouth.   clobetasol cream 0.05 % Commonly known as: TEMOVATE APPLY TOPICALLY 2 (TWO) TIMES DAILY.   CVS D3 50 MCG (2000 UT) Caps Generic drug: Cholecalciferol TAKE 1 CAPSULE BY MOUTH EVERY DAY   Cyanocobalamin 5000  MCG/ML Liqd Place under the tongue.   escitalopram 10 MG tablet Commonly known as: LEXAPRO TAKE 1 TABLET BY MOUTH EVERY DAY   levothyroxine 75 MCG tablet Commonly known as: SYNTHROID TAKE 1 TABLET BY MOUTH EVERY DAY   linaclotide 290 MCG Caps capsule Commonly known as: Linzess Take 1 capsule (290 mcg total) by mouth daily before breakfast.   multivitamin capsule Take by mouth.   Poly-Iron 150 Forte 150-0.025-1 MG Caps Generic drug: Iron Polysacch Cmplx-B12-FA TAKE 1 CAPSULE BY MOUTH EVERY DAY   Restasis 0.05 % ophthalmic emulsion Generic drug: cycloSPORINE   rosuvastatin 10 MG tablet Commonly known as: CRESTOR TAKE 1 TABLET BY MOUTH EVERY DAY   traZODone 50 MG tablet Commonly known as: DESYREL TAKE 2 TABLETS BY MOUTH AT BEDTIME AS NEEDED FOR SLEEP   tretinoin 0.025 % cream Commonly known as: RETIN-A Apply topically at bedtime.   triamcinolone cream 0.1 % Commonly known as: KENALOG 1 (ONE) APPLICATION TO EAR CANAL ENTRANCE DAILY PRN ITCHING AND IRRITATION   vitamin C 100 MG tablet Take by mouth.       Allergies:  Allergies  Allergen Reactions  . Flagyl [Metronidazole Hcl] Hives    Past Medical History, Surgical history, Social history, and Family History were reviewed and updated.  Review of Systems: All other 10 point review of systems is negative.  Physical Exam:  vitals were not taken for this visit.   Wt Readings from Last 3 Encounters:  08/03/19 232 lb 1.9 oz (105.3 kg)  04/24/19 226 lb (102.5 kg)  01/29/19 226 lb 6.4 oz (102.7 kg)    Ocular: Sclerae unicteric, pupils equal, round and reactive to light Ear-nose-throat: Oropharynx clear, dentition fair Lymphatic: No cervical or supraclavicular adenopathy Lungs no rales or rhonchi, good excursion bilaterally Heart regular rate and rhythm, no murmur appreciated Abd soft, nontender, positive bowel sounds, no liver or spleen tip palpated on exam, no fluid wave  MSK no focal spinal tenderness,  no joint edema Neuro: non-focal, well-oriented, appropriate affect Breasts: Deferred  Lab Results  Component Value Date   WBC 8.2 07/12/2020   HGB 13.8 07/12/2020   HCT 42.7 07/12/2020   MCV 92.0 07/12/2020   PLT 287 07/12/2020   Lab Results  Component Value Date   FERRITIN 334 (H) 12/15/2019   IRON 89 12/15/2019   TIBC 268 12/15/2019   UIBC 179 12/15/2019   IRONPCTSAT 33 12/15/2019   Lab Results  Component Value Date   RETICCTPCT 1.6 07/12/2020   RBC 4.64 07/12/2020   RBC 4.67 07/12/2020   RETICCTABS 77.8 09/01/2015   No results found for: KPAFRELGTCHN, LAMBDASER, KAPLAMBRATIO No results found for: IGGSERUM, IGA, IGMSERUM No results found for: Kathrynn Ducking, MSPIKE, SPEI   Chemistry      Component Value Date/Time   NA 137 01/29/2019 1517   NA 140 09/13/2017 1125   NA 138 09/01/2015 1117   K 4.3 01/29/2019 1517   K 4.2 09/13/2017 1125   K 4.4 09/01/2015 1117   CL 104 01/29/2019 1517   CL 104 09/13/2017 1125   CO2 26 01/29/2019 1517   CO2 27 09/13/2017 1125   CO2 18 (L) 09/01/2015 1117   BUN 17 01/29/2019 1517   BUN 17 09/13/2017 1125   BUN 29.4 (H) 09/01/2015 1117   CREATININE 0.88 01/29/2019 1517   CREATININE 0.81 01/29/2019 1227   CREATININE 0.7 09/13/2017 1125   CREATININE 0.9 09/01/2015 1117      Component Value Date/Time   CALCIUM 9.9 01/29/2019 1517   CALCIUM 9.6 09/13/2017 1125   CALCIUM 10.1 09/01/2015 1117   ALKPHOS 69 01/29/2019 1517   ALKPHOS 70 09/13/2017 1125   ALKPHOS 90 09/01/2015 1117   AST 15 01/29/2019 1517   AST 15 01/29/2019 1227   AST 13 09/01/2015 1117   ALT 16 01/29/2019 1517   ALT 17 01/29/2019 1227   ALT 31 09/13/2017 1125   ALT 15 09/01/2015 1117   BILITOT 0.3 01/29/2019 1517   BILITOT 0.3 01/29/2019 1227   BILITOT 0.33 09/01/2015 1117       Impression and Plan: Olivia Mosley is a very pleasant 64yo caucasian female with iron deficiency anemia. She continues to do well. She  feels much better off the oral iron supplement. Hgb is stable at 13.8, MCV 92.  She would like to now go to follow-ups as needed which will be fine. We discussed symptoms to be on the look out for associated with anemia and she verbalized understanding.  She will contact our office with any questions or concerns. We can certainly see her again in if needed.   Laverna Peace, NP 10/19/20213:29 PM

## 2020-07-13 ENCOUNTER — Telehealth: Payer: Self-pay | Admitting: Family

## 2020-07-13 LAB — IRON AND TIBC
Iron: 61 ug/dL (ref 41–142)
Saturation Ratios: 19 % — ABNORMAL LOW (ref 21–57)
TIBC: 315 ug/dL (ref 236–444)
UIBC: 254 ug/dL (ref 120–384)

## 2020-07-13 LAB — FERRITIN: Ferritin: 276 ng/mL (ref 11–307)

## 2020-07-13 NOTE — Telephone Encounter (Signed)
No los 10/19

## 2020-07-14 ENCOUNTER — Encounter: Payer: Self-pay | Admitting: Family

## 2020-07-14 ENCOUNTER — Other Ambulatory Visit: Payer: Self-pay | Admitting: Family

## 2020-07-21 ENCOUNTER — Inpatient Hospital Stay: Payer: Managed Care, Other (non HMO)

## 2020-07-21 ENCOUNTER — Other Ambulatory Visit: Payer: Self-pay

## 2020-07-21 VITALS — BP 106/53 | HR 74 | Temp 98.0°F | Resp 18

## 2020-07-21 DIAGNOSIS — D509 Iron deficiency anemia, unspecified: Secondary | ICD-10-CM

## 2020-07-21 MED ORDER — SODIUM CHLORIDE 0.9 % IV SOLN
200.0000 mg | Freq: Once | INTRAVENOUS | Status: AC
  Administered 2020-07-21: 200 mg via INTRAVENOUS
  Filled 2020-07-21: qty 200

## 2020-07-21 MED ORDER — SODIUM CHLORIDE 0.9 % IV SOLN
Freq: Once | INTRAVENOUS | Status: AC
  Filled 2020-07-21: qty 250

## 2020-07-21 NOTE — Patient Instructions (Signed)

## 2020-07-28 ENCOUNTER — Inpatient Hospital Stay: Payer: Managed Care, Other (non HMO)

## 2020-08-02 ENCOUNTER — Other Ambulatory Visit: Payer: Self-pay

## 2020-08-02 ENCOUNTER — Inpatient Hospital Stay: Payer: Managed Care, Other (non HMO) | Attending: Family

## 2020-08-02 VITALS — BP 120/68 | HR 69 | Temp 98.3°F | Resp 17

## 2020-08-02 DIAGNOSIS — D509 Iron deficiency anemia, unspecified: Secondary | ICD-10-CM | POA: Diagnosis not present

## 2020-08-02 MED ORDER — SODIUM CHLORIDE 0.9 % IV SOLN
200.0000 mg | Freq: Once | INTRAVENOUS | Status: AC
  Administered 2020-08-02: 200 mg via INTRAVENOUS
  Filled 2020-08-02: qty 200

## 2020-08-02 MED ORDER — SODIUM CHLORIDE 0.9 % IV SOLN
Freq: Once | INTRAVENOUS | Status: AC
  Filled 2020-08-02: qty 250

## 2020-08-02 NOTE — Progress Notes (Signed)
Pt. refused to wait 30 minutes post infusion. Released stable and asymptomatic.

## 2020-08-02 NOTE — Patient Instructions (Signed)

## 2020-12-22 ENCOUNTER — Encounter: Payer: Self-pay | Admitting: Family

## 2020-12-23 ENCOUNTER — Telehealth: Payer: Self-pay | Admitting: *Deleted

## 2020-12-23 NOTE — Telephone Encounter (Signed)
Per patient request advice 12/23/20 - called and gave patient upcoming appointment

## 2020-12-26 ENCOUNTER — Other Ambulatory Visit: Payer: Self-pay | Admitting: Family

## 2020-12-26 ENCOUNTER — Inpatient Hospital Stay (HOSPITAL_BASED_OUTPATIENT_CLINIC_OR_DEPARTMENT_OTHER): Payer: Managed Care, Other (non HMO) | Admitting: Family

## 2020-12-26 ENCOUNTER — Other Ambulatory Visit: Payer: Self-pay

## 2020-12-26 ENCOUNTER — Encounter: Payer: Self-pay | Admitting: Family

## 2020-12-26 ENCOUNTER — Inpatient Hospital Stay: Payer: Managed Care, Other (non HMO) | Attending: Hematology & Oncology

## 2020-12-26 VITALS — BP 128/72 | HR 90 | Temp 97.8°F | Resp 19 | Ht 60.0 in | Wt 232.0 lb

## 2020-12-26 DIAGNOSIS — D509 Iron deficiency anemia, unspecified: Secondary | ICD-10-CM

## 2020-12-26 LAB — RETICULOCYTES
Immature Retic Fract: 10.8 % (ref 2.3–15.9)
RBC.: 4.82 MIL/uL (ref 3.87–5.11)
Retic Count, Absolute: 96.4 10*3/uL (ref 19.0–186.0)
Retic Ct Pct: 2 % (ref 0.4–3.1)

## 2020-12-26 LAB — CBC WITH DIFFERENTIAL (CANCER CENTER ONLY)
Abs Immature Granulocytes: 0.07 10*3/uL (ref 0.00–0.07)
Basophils Absolute: 0 10*3/uL (ref 0.0–0.1)
Basophils Relative: 1 %
Eosinophils Absolute: 0.1 10*3/uL (ref 0.0–0.5)
Eosinophils Relative: 2 %
HCT: 44.1 % (ref 36.0–46.0)
Hemoglobin: 14.6 g/dL (ref 12.0–15.0)
Immature Granulocytes: 1 %
Lymphocytes Relative: 21 %
Lymphs Abs: 1.8 10*3/uL (ref 0.7–4.0)
MCH: 30.5 pg (ref 26.0–34.0)
MCHC: 33.1 g/dL (ref 30.0–36.0)
MCV: 92.1 fL (ref 80.0–100.0)
Monocytes Absolute: 0.9 10*3/uL (ref 0.1–1.0)
Monocytes Relative: 10 %
Neutro Abs: 5.8 10*3/uL (ref 1.7–7.7)
Neutrophils Relative %: 65 %
Platelet Count: 329 10*3/uL (ref 150–400)
RBC: 4.79 MIL/uL (ref 3.87–5.11)
RDW: 13.6 % (ref 11.5–15.5)
WBC Count: 8.8 10*3/uL (ref 4.0–10.5)
nRBC: 0 % (ref 0.0–0.2)

## 2020-12-26 NOTE — Progress Notes (Addendum)
Hematology and Oncology Follow Up Visit  Olivia Mosley [id number] 21-Jun-1956 65 y.o. 12/26/2020   Principle Diagnosis:  Iron deficiency anemia  Current Therapy: IV iron as indicated   Interim History:  Olivia Mosley is here today for follow-up. She is symptomatic with fatigue and mild SOB with exertion.  She has not noted any blood loss. No bruising or petechiae.  No fever, chills, n/v, cough, rash, dizziness, SOB, chest pain, palpitations, abdominal pain or changes in bladder habits.  She has some constipation and has started taking Mirilax daily as needed.  She states that she will be having her colonoscopy in May of this year and will also be having a voice study to assess for changes with GERD.  She has generalized aches and pains due to osteoarthritis. She states that her recent (March) injections in her lower back have helped her pain and improved her mobility.  No swelling in her extremities.  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  No falls or syncope to report.  She has maintained a good appetite and admits that she needs to better hydrate throughout the day.  She has had issues with hot flashes and sweating. She states that her PCP is working to rule out hypoglycemia vs. dehydration.   ECOG Performance Status: 1 - Symptomatic but completely ambulatory  Medications:  Allergies as of 12/26/2020      Reactions   Flagyl [metronidazole Hcl] Hives      Medication List       Accurate as of December 26, 2020 11:52 AM. If you have any questions, ask your nurse or doctor.        Azelaic Acid 15 % cream Commonly known as: Finacea APPLY 1/2 GRAM TWICE DAILY   azelastine 0.1 % nasal spray Commonly known as: ASTELIN USE 2 SPRAYS IN EACH NOSTRIL TWICE DAILY   b complex vitamins capsule Take by mouth.   clobetasol cream 0.05 % Commonly known as: TEMOVATE APPLY TOPICALLY 2 (TWO) TIMES DAILY.   [facility]S D3 50 MCG  (2000 UT) Caps Generic drug: Cholecalciferol TAKE 1 CAPSULE BY MOUTH EVERY DAY   Cyanocobalamin 5000 MCG/ML Liqd Place under the tongue.   escitalopram 10 MG tablet Commonly known as: LEXAPRO TAKE 1 TABLET BY MOUTH EVERY DAY   levothyroxine 75 MCG tablet Commonly known as: SYNTHROID TAKE 1 TABLET BY MOUTH EVERY DAY   linaclotide 290 MCG Caps capsule Commonly known as: Linzess Take 1 capsule (290 mcg total) by mouth daily before breakfast.   multivitamin capsule Take by mouth.   Restasis 0.05 % ophthalmic emulsion Generic drug: cycloSPORINE   rosuvastatin 10 MG tablet Commonly known as: CRESTOR TAKE 1 TABLET BY MOUTH EVERY DAY   traZODone 50 MG tablet Commonly known as: DESYREL TAKE 2 TABLETS BY MOUTH AT BEDTIME AS NEEDED FOR SLEEP   tretinoin 0.025 % cream Commonly known as: RETIN-A Apply topically at bedtime.   triamcinolone 0.1 % Commonly known as: KENALOG 1 (ONE) APPLICATION TO EAR CANAL ENTRANCE DAILY PRN ITCHING AND IRRITATION   vitamin C 100 MG tablet Take by mouth.       Allergies:  Allergies  Allergen Reactions  . Flagyl [Metronidazole Hcl] Hives    Past Medical History, Surgical history, Social history, and Family History were reviewed and updated.  Review of Systems: All other 10 point review of systems is negative.   Physical Exam:  vitals were not taken for this visit.  Wt Readings from Last 3 Encounters:  07/12/20 240 lb (108.9 kg)  08/03/19 232 lb 1.9 oz (105.3 kg)  04/24/19 226 lb (102.5 kg)    Ocular: Sclerae unicteric, pupils equal, round and reactive to light Ear-nose-throat: Oropharynx clear, dentition fair Lymphatic: No cervical or supraclavicular adenopathy Lungs no rales or rhonchi, good excursion bilaterally Heart regular rate and rhythm, no murmur appreciated Abd soft, nontender, positive bowel sounds MSK no focal spinal tenderness, no joint edema Neuro: non-focal, well-oriented, appropriate affect Breasts: Deferred    Lab Results  Component Value Date   WBC 8.8 12/26/2020   HGB 14.6 12/26/2020   HCT 44.1 12/26/2020   MCV 92.1 12/26/2020   PLT 329 12/26/2020   Lab Results  Component Value Date   FERRITIN 276 07/12/2020   IRON 61 07/12/2020   TIBC 315 07/12/2020   UIBC 254 07/12/2020   IRONPCTSAT 19 (L) 07/12/2020   Lab Results  Component Value Date   RETICCTPCT 2.0 12/26/2020   RBC 4.82 12/26/2020   RETICCTABS 77.8 09/01/2015   No results found for: KPAFRELGTCHN, LAMBDASER, KAPLAMBRATIO No results found for: IGGSERUM, IGA, IGMSERUM No results found for: Kathrynn Ducking, MSPIKE, SPEI   Chemistry      Component Value Date/Time   NA 137 01/29/2019 1517   NA 140 09/13/2017 1125   NA 138 09/01/2015 1117   K 4.3 01/29/2019 1517   K 4.2 09/13/2017 1125   K 4.4 09/01/2015 1117   CL 104 01/29/2019 1517   CL 104 09/13/2017 1125   CO2 26 01/29/2019 1517   CO2 27 09/13/2017 1125   CO2 18 (L) 09/01/2015 1117   BUN 17 01/29/2019 1517   BUN 17 09/13/2017 1125   BUN 29.4 (H) 09/01/2015 1117   CREATININE 0.88 01/29/2019 1517   CREATININE 0.81 01/29/2019 1227   CREATININE 0.7 09/13/2017 1125   CREATININE 0.9 09/01/2015 1117      Component Value Date/Time   CALCIUM 9.9 01/29/2019 1517   CALCIUM 9.6 09/13/2017 1125   CALCIUM 10.1 09/01/2015 1117   ALKPHOS 69 01/29/2019 1517   ALKPHOS 70 09/13/2017 1125   ALKPHOS 90 09/01/2015 1117   AST 15 01/29/2019 1517   AST 15 01/29/2019 1227   AST 13 09/01/2015 1117   ALT 16 01/29/2019 1517   ALT 17 01/29/2019 1227   ALT 31 09/13/2017 1125   ALT 15 09/01/2015 1117   BILITOT 0.3 01/29/2019 1517   BILITOT 0.3 01/29/2019 1227   BILITOT 0.33 09/01/2015 1117       Impression and Plan: Olivia Mosley is a very pleasant 65yo caucasian female with iron deficiency anemia. Iron studies are pending. We will replace if needed.  If lab work is normal she will follow-up with PCP for further eval.  We will repeat  labs in 6 months and she will continue to follow-up as needed.  She can contact our office with any questions or concerns.   Laverna Peace, NP 4/4/202211:52 AM

## 2020-12-27 LAB — IRON AND TIBC
Iron: 140 ug/dL (ref 41–142)
Saturation Ratios: 39 % (ref 21–57)
TIBC: 354 ug/dL (ref 236–444)
UIBC: 214 ug/dL (ref 120–384)

## 2020-12-27 LAB — FERRITIN: Ferritin: 623 ng/mL — ABNORMAL HIGH (ref 11–307)

## 2021-03-06 ENCOUNTER — Telehealth: Payer: Self-pay

## 2021-03-06 ENCOUNTER — Other Ambulatory Visit: Payer: Self-pay | Admitting: Family

## 2021-03-06 ENCOUNTER — Telehealth: Payer: Self-pay | Admitting: *Deleted

## 2021-03-06 DIAGNOSIS — D509 Iron deficiency anemia, unspecified: Secondary | ICD-10-CM

## 2021-03-06 NOTE — Telephone Encounter (Signed)
Returned call to pt lmovm, advised a lab appt will be made to check iron studies. Pt to expect a call from scheduling.

## 2021-03-07 ENCOUNTER — Inpatient Hospital Stay: Payer: Managed Care, Other (non HMO) | Attending: Hematology & Oncology

## 2021-03-07 ENCOUNTER — Other Ambulatory Visit: Payer: Self-pay

## 2021-03-07 DIAGNOSIS — D509 Iron deficiency anemia, unspecified: Secondary | ICD-10-CM

## 2021-03-07 LAB — RETICULOCYTES
Immature Retic Fract: 17.1 % — ABNORMAL HIGH (ref 2.3–15.9)
RBC.: 4.48 MIL/uL (ref 3.87–5.11)
Retic Count, Absolute: 77.5 10*3/uL (ref 19.0–186.0)
Retic Ct Pct: 1.7 % (ref 0.4–3.1)

## 2021-03-07 LAB — CMP (CANCER CENTER ONLY)
ALT: 18 U/L (ref 0–44)
AST: 18 U/L (ref 15–41)
Albumin: 4.4 g/dL (ref 3.5–5.0)
Alkaline Phosphatase: 72 U/L (ref 38–126)
Anion gap: 7 (ref 5–15)
BUN: 18 mg/dL (ref 8–23)
CO2: 28 mmol/L (ref 22–32)
Calcium: 10.6 mg/dL — ABNORMAL HIGH (ref 8.9–10.3)
Chloride: 102 mmol/L (ref 98–111)
Creatinine: 0.76 mg/dL (ref 0.44–1.00)
GFR, Estimated: >60 mL/min (ref >60)
Glucose, Bld: 86 mg/dL (ref 70–99)
Potassium: 4.9 mmol/L (ref 3.5–5.1)
Sodium: 137 mmol/L (ref 135–145)
Total Bilirubin: 0.3 mg/dL (ref 0.3–1.2)
Total Protein: 6.6 g/dL (ref 6.5–8.1)

## 2021-03-07 LAB — CBC WITH DIFFERENTIAL (CANCER CENTER ONLY)
Abs Immature Granulocytes: 0.04 10*3/uL (ref 0.00–0.07)
Basophils Absolute: 0 10*3/uL (ref 0.0–0.1)
Basophils Relative: 1 %
Eosinophils Absolute: 0.3 10*3/uL (ref 0.0–0.5)
Eosinophils Relative: 3 %
HCT: 41.4 % (ref 36.0–46.0)
Hemoglobin: 13.6 g/dL (ref 12.0–15.0)
Immature Granulocytes: 1 %
Lymphocytes Relative: 14 %
Lymphs Abs: 1.1 10*3/uL (ref 0.7–4.0)
MCH: 30.2 pg (ref 26.0–34.0)
MCHC: 32.9 g/dL (ref 30.0–36.0)
MCV: 92 fL (ref 80.0–100.0)
Monocytes Absolute: 0.7 10*3/uL (ref 0.1–1.0)
Monocytes Relative: 8 %
Neutro Abs: 5.9 10*3/uL (ref 1.7–7.7)
Neutrophils Relative %: 73 %
Platelet Count: 295 10*3/uL (ref 150–400)
RBC: 4.5 MIL/uL (ref 3.87–5.11)
RDW: 12.8 % (ref 11.5–15.5)
WBC Count: 8.1 10*3/uL (ref 4.0–10.5)
nRBC: 0 % (ref 0.0–0.2)

## 2021-03-08 LAB — FERRITIN: Ferritin: 365 ng/mL — ABNORMAL HIGH (ref 11–307)

## 2021-03-08 LAB — IRON AND TIBC
Iron: 66 ug/dL (ref 41–142)
Saturation Ratios: 22 % (ref 21–57)
TIBC: 299 ug/dL (ref 236–444)
UIBC: 233 ug/dL (ref 120–384)

## 2021-06-20 ENCOUNTER — Encounter: Payer: Self-pay | Admitting: Family

## 2021-06-26 ENCOUNTER — Encounter: Payer: Self-pay | Admitting: Family

## 2021-06-27 ENCOUNTER — Other Ambulatory Visit: Payer: Managed Care, Other (non HMO)

## 2021-06-30 ENCOUNTER — Other Ambulatory Visit: Payer: Managed Care, Other (non HMO)

## 2021-07-03 ENCOUNTER — Other Ambulatory Visit: Payer: Managed Care, Other (non HMO)

## 2021-07-06 ENCOUNTER — Inpatient Hospital Stay: Payer: Managed Care, Other (non HMO)

## 2021-07-10 ENCOUNTER — Other Ambulatory Visit: Payer: Managed Care, Other (non HMO)

## 2021-07-11 ENCOUNTER — Other Ambulatory Visit: Payer: Self-pay

## 2021-07-11 ENCOUNTER — Inpatient Hospital Stay: Payer: Medicare HMO | Attending: Hematology & Oncology

## 2021-07-11 DIAGNOSIS — D509 Iron deficiency anemia, unspecified: Secondary | ICD-10-CM | POA: Diagnosis present

## 2021-07-11 LAB — CBC WITH DIFFERENTIAL (CANCER CENTER ONLY)
Abs Immature Granulocytes: 0.11 10*3/uL — ABNORMAL HIGH (ref 0.00–0.07)
Basophils Absolute: 0 10*3/uL (ref 0.0–0.1)
Basophils Relative: 1 %
Eosinophils Absolute: 0.2 10*3/uL (ref 0.0–0.5)
Eosinophils Relative: 2 %
HCT: 43.9 % (ref 36.0–46.0)
Hemoglobin: 14.3 g/dL (ref 12.0–15.0)
Immature Granulocytes: 1 %
Lymphocytes Relative: 20 %
Lymphs Abs: 1.6 10*3/uL (ref 0.7–4.0)
MCH: 30 pg (ref 26.0–34.0)
MCHC: 32.6 g/dL (ref 30.0–36.0)
MCV: 92 fL (ref 80.0–100.0)
Monocytes Absolute: 0.7 10*3/uL (ref 0.1–1.0)
Monocytes Relative: 9 %
Neutro Abs: 5.5 10*3/uL (ref 1.7–7.7)
Neutrophils Relative %: 67 %
Platelet Count: 308 10*3/uL (ref 150–400)
RBC: 4.77 MIL/uL (ref 3.87–5.11)
RDW: 13.6 % (ref 11.5–15.5)
WBC Count: 8.1 10*3/uL (ref 4.0–10.5)
nRBC: 0 % (ref 0.0–0.2)

## 2021-07-11 LAB — RETICULOCYTES
Immature Retic Fract: 15.3 % (ref 2.3–15.9)
RBC.: 4.77 MIL/uL (ref 3.87–5.11)
Retic Count, Absolute: 89.2 10*3/uL (ref 19.0–186.0)
Retic Ct Pct: 1.9 % (ref 0.4–3.1)

## 2021-07-12 LAB — IRON AND TIBC
Iron: 88 ug/dL (ref 41–142)
Saturation Ratios: 27 % (ref 21–57)
TIBC: 329 ug/dL (ref 236–444)
UIBC: 240 ug/dL (ref 120–384)

## 2021-07-12 LAB — FERRITIN: Ferritin: 374 ng/mL — ABNORMAL HIGH (ref 11–307)

## 2021-08-03 ENCOUNTER — Encounter: Payer: Self-pay | Admitting: Family

## 2021-12-11 ENCOUNTER — Encounter: Payer: Self-pay | Admitting: Family

## 2021-12-12 ENCOUNTER — Other Ambulatory Visit: Payer: Self-pay | Admitting: Family

## 2021-12-12 DIAGNOSIS — D509 Iron deficiency anemia, unspecified: Secondary | ICD-10-CM

## 2021-12-13 ENCOUNTER — Ambulatory Visit: Payer: Medicare HMO | Admitting: Family

## 2021-12-13 ENCOUNTER — Inpatient Hospital Stay: Payer: Medicare HMO

## 2021-12-29 ENCOUNTER — Inpatient Hospital Stay: Payer: Medicare Other | Admitting: Family

## 2021-12-29 ENCOUNTER — Inpatient Hospital Stay: Payer: Medicare Other

## 2022-01-02 ENCOUNTER — Ambulatory Visit: Payer: Medicare Other | Admitting: Family

## 2022-01-02 ENCOUNTER — Inpatient Hospital Stay: Payer: Medicare Other

## 2022-01-03 ENCOUNTER — Inpatient Hospital Stay (HOSPITAL_BASED_OUTPATIENT_CLINIC_OR_DEPARTMENT_OTHER): Payer: Medicare Other | Admitting: Family

## 2022-01-03 ENCOUNTER — Encounter: Payer: Self-pay | Admitting: Family

## 2022-01-03 ENCOUNTER — Inpatient Hospital Stay: Payer: Medicare Other | Attending: Family

## 2022-01-03 VITALS — BP 121/74 | HR 94 | Temp 98.3°F | Resp 17 | Wt 232.4 lb

## 2022-01-03 DIAGNOSIS — R0602 Shortness of breath: Secondary | ICD-10-CM | POA: Insufficient documentation

## 2022-01-03 DIAGNOSIS — R5383 Other fatigue: Secondary | ICD-10-CM | POA: Insufficient documentation

## 2022-01-03 DIAGNOSIS — D509 Iron deficiency anemia, unspecified: Secondary | ICD-10-CM | POA: Diagnosis present

## 2022-01-03 LAB — CBC WITH DIFFERENTIAL (CANCER CENTER ONLY)
Abs Immature Granulocytes: 0.03 10*3/uL (ref 0.00–0.07)
Basophils Absolute: 0.1 10*3/uL (ref 0.0–0.1)
Basophils Relative: 1 %
Eosinophils Absolute: 0.3 10*3/uL (ref 0.0–0.5)
Eosinophils Relative: 3 %
HCT: 41.8 % (ref 36.0–46.0)
Hemoglobin: 13.5 g/dL (ref 12.0–15.0)
Immature Granulocytes: 0 %
Lymphocytes Relative: 15 %
Lymphs Abs: 1.3 10*3/uL (ref 0.7–4.0)
MCH: 29.9 pg (ref 26.0–34.0)
MCHC: 32.3 g/dL (ref 30.0–36.0)
MCV: 92.7 fL (ref 80.0–100.0)
Monocytes Absolute: 0.6 10*3/uL (ref 0.1–1.0)
Monocytes Relative: 8 %
Neutro Abs: 6.2 10*3/uL (ref 1.7–7.7)
Neutrophils Relative %: 73 %
Platelet Count: 306 10*3/uL (ref 150–400)
RBC: 4.51 MIL/uL (ref 3.87–5.11)
RDW: 13.5 % (ref 11.5–15.5)
WBC Count: 8.5 10*3/uL (ref 4.0–10.5)
nRBC: 0 % (ref 0.0–0.2)

## 2022-01-03 LAB — RETICULOCYTES
Immature Retic Fract: 20 % — ABNORMAL HIGH (ref 2.3–15.9)
RBC.: 4.51 MIL/uL (ref 3.87–5.11)
Retic Count, Absolute: 97 10*3/uL (ref 19.0–186.0)
Retic Ct Pct: 2.2 % (ref 0.4–3.1)

## 2022-01-03 NOTE — Progress Notes (Signed)
?Hematology and Oncology Follow Up Visit ? ?Olivia Mosley ?[id number] ?04/22/1956 66 y.o. ?01/03/2022 ? ? ?Principle Diagnosis:  ?Iron deficiency anemia ?  ?Current Therapy:        ?IV iron as indicated ?  ?Interim History:  Olivia Mosley is here today for follow-up. She is feeling fatigued and has noted some mild SOB with over exertion.  ?She has occasional bright red blood loss in her stool with straining and constipation. No other blood loss noted.  ?No abnormal bruising, no petechiae.  ?No fever, chills, n/v, cough, rash, dizziness, chest pain, palpitations, abdominal pain or changes in bladder habits at this time.  ?No swelling in her extremities.  ?No falls or syncope.  ?He has maintained a good appetite and is staying well hydrated. Her weight is 232 lbs.  ? ?ECOG Performance Status: 0 - Asymptomatic ? ?Medications:  ?Allergies as of 01/03/2022   ? ?   Reactions  ? Flagyl [metronidazole Hcl] Hives  ? ?  ? ?  ?Medication List  ?  ? ?  ? Accurate as of January 03, 2022  3:28 PM. If you have any questions, ask your nurse or doctor.  ?  ?  ? ?  ? ?acetaminophen 500 MG tablet ?Commonly known as: TYLENOL ?Take 500 mg by mouth as needed. ?  ?Azelaic Acid 15 % gel ?Commonly known as: Finacea ?APPLY 1/2 GRAM TWICE DAILY ?  ?azelastine 0.1 % nasal spray ?Commonly known as: ASTELIN ?USE 2 SPRAYS IN EACH NOSTRIL TWICE DAILY ?  ?b complex vitamins capsule ?Take by mouth. ?  ?clobetasol cream 0.05 % ?Commonly known as: TEMOVATE ?APPLY TOPICALLY 2 (TWO) TIMES DAILY. ?  ?CVS D3 50 MCG (2000 UT) Caps ?Generic drug: Cholecalciferol ?TAKE 1 CAPSULE BY MOUTH EVERY DAY ?  ?Cyanocobalamin 5000 MCG/ML Liqd ?Place under the tongue. ?  ?cyclobenzaprine 5 MG tablet ?Commonly known as: FLEXERIL ?Take 5 mg by mouth 2 (two) times daily as needed. ?  ?escitalopram 10 MG tablet ?Commonly known as: LEXAPRO ?TAKE 1 TABLET BY MOUTH EVERY DAY ?  ?gabapentin 100 MG capsule ?Commonly known as: NEURONTIN ?Take 200 mg by mouth 3 (three) times daily. ?   ?levothyroxine 75 MCG tablet ?Commonly known as: SYNTHROID ?TAKE 1 TABLET BY MOUTH EVERY DAY ?  ?linaclotide 290 MCG Caps capsule ?Commonly known as: Linzess ?Take 1 capsule (290 mcg total) by mouth daily before breakfast. ?  ?meloxicam 7.5 MG tablet ?Commonly known as: MOBIC ?Take 7.5 mg by mouth as needed for pain. ?  ?multivitamin capsule ?Take by mouth. ?  ?Restasis 0.05 % ophthalmic emulsion ?Generic drug: cycloSPORINE ?  ?rosuvastatin 10 MG tablet ?Commonly known as: CRESTOR ?TAKE 1 TABLET BY MOUTH EVERY DAY ?  ?traZODone 50 MG tablet ?Commonly known as: DESYREL ?TAKE 2 TABLETS BY MOUTH AT BEDTIME AS NEEDED FOR SLEEP ?  ?tretinoin 0.025 % cream ?Commonly known as: RETIN-A ?Apply topically at bedtime. ?  ?triamcinolone cream 0.1 % ?Commonly known as: KENALOG ?1 (ONE) APPLICATION TO EAR CANAL ENTRANCE DAILY PRN ITCHING AND IRRITATION ?  ?vitamin C 100 MG tablet ?Take by mouth. ?  ? ?  ? ? ?Allergies:  ?Allergies  ?Allergen Reactions  ? Flagyl [Metronidazole Hcl] Hives  ? ? ?Past Medical History, Surgical history, Social history, and Family History were reviewed and updated. ? ?Review of Systems: ?All other 10 point review of systems is negative.  ? ?Physical Exam: ? weight is 232 lb 6.4 oz (105.4 kg). Her oral temperature is 98.3 ?F (36.8 ?  C). Her blood pressure is 121/74 and her pulse is 94. Her respiration is 17 and oxygen saturation is 98%.  ? ?Wt Readings from Last 3 Encounters:  ?01/03/22 232 lb 6.4 oz (105.4 kg)  ?12/26/20 232 lb (105.2 kg)  ?07/12/20 240 lb (108.9 kg)  ? ? ?Ocular: Sclerae unicteric, pupils equal, round and reactive to light ?Ear-nose-throat: Oropharynx clear, dentition fair ?Lymphatic: No cervical or supraclavicular adenopathy ?Lungs no rales or rhonchi, good excursion bilaterally ?Heart regular rate and rhythm, no murmur appreciated ?Abd soft, nontender, positive bowel sounds ?MSK no focal spinal tenderness, no joint edema ?Neuro: non-focal, well-oriented, appropriate affect ?Breasts:  Deferred  ? ?Lab Results  ?Component Value Date  ? WBC 8.5 01/03/2022  ? HGB 13.5 01/03/2022  ? HCT 41.8 01/03/2022  ? MCV 92.7 01/03/2022  ? PLT 306 01/03/2022  ? ?Lab Results  ?Component Value Date  ? FERRITIN 374 (H) 07/11/2021  ? IRON 88 07/11/2021  ? TIBC 329 07/11/2021  ? UIBC 240 07/11/2021  ? IRONPCTSAT 27 07/11/2021  ? ?Lab Results  ?Component Value Date  ? RETICCTPCT 2.2 01/03/2022  ? RBC 4.51 01/03/2022  ? RETICCTABS 77.8 09/01/2015  ? ?No results found for: KPAFRELGTCHN, LAMBDASER, KAPLAMBRATIO ?No results found for: IGGSERUM, IGA, IGMSERUM ?No results found for: TOTALPROTELP, ALBUMINELP, A1GS, A2GS, BETS, BETA2SER, GAMS, MSPIKE, SPEI ?  Chemistry   ?   ?Component Value Date/Time  ? NA 137 03/07/2021 1515  ? NA 140 09/13/2017 1125  ? NA 138 09/01/2015 1117  ? K 4.9 03/07/2021 1515  ? K 4.2 09/13/2017 1125  ? K 4.4 09/01/2015 1117  ? CL 102 03/07/2021 1515  ? CL 104 09/13/2017 1125  ? CO2 28 03/07/2021 1515  ? CO2 27 09/13/2017 1125  ? CO2 18 (L) 09/01/2015 1117  ? BUN 18 03/07/2021 1515  ? BUN 17 09/13/2017 1125  ? BUN 29.4 (H) 09/01/2015 1117  ? CREATININE 0.76 03/07/2021 1515  ? CREATININE 0.7 09/13/2017 1125  ? CREATININE 0.9 09/01/2015 1117  ?    ?Component Value Date/Time  ? CALCIUM 10.6 (H) 03/07/2021 1515  ? CALCIUM 9.6 09/13/2017 1125  ? CALCIUM 10.1 09/01/2015 1117  ? ALKPHOS 72 03/07/2021 1515  ? ALKPHOS 70 09/13/2017 1125  ? ALKPHOS 90 09/01/2015 1117  ? AST 18 03/07/2021 1515  ? AST 13 09/01/2015 1117  ? ALT 18 03/07/2021 1515  ? ALT 31 09/13/2017 1125  ? ALT 15 09/01/2015 1117  ? BILITOT 0.3 03/07/2021 1515  ? BILITOT 0.33 09/01/2015 1117  ?  ? ? ? ?Impression and Plan: Olivia Mosley is a very pleasant 66 yo caucasian female with iron deficiency anemia.   ?Iron studies are pending.  ?Follow-up as needed.  ? ?Lottie Dawson, NP ?4/12/20233:28 PM ? ?

## 2022-01-04 ENCOUNTER — Telehealth: Payer: Self-pay | Admitting: *Deleted

## 2022-01-04 LAB — IRON AND IRON BINDING CAPACITY (CC-WL,HP ONLY)
Iron: 74 ug/dL (ref 28–170)
Saturation Ratios: 22 % (ref 10.4–31.8)
TIBC: 335 ug/dL (ref 250–450)
UIBC: 261 ug/dL (ref 148–442)

## 2022-01-04 LAB — FERRITIN: Ferritin: 295 ng/mL (ref 11–307)

## 2022-01-04 NOTE — Telephone Encounter (Signed)
Per 01/03/22 los - Follow-up as needed.  ?

## 2022-02-28 ENCOUNTER — Encounter: Payer: Self-pay | Admitting: Family

## 2022-03-16 ENCOUNTER — Encounter: Payer: Self-pay | Admitting: Family

## 2023-09-13 ENCOUNTER — Other Ambulatory Visit: Payer: Self-pay | Admitting: Family

## 2023-09-13 DIAGNOSIS — D509 Iron deficiency anemia, unspecified: Secondary | ICD-10-CM

## 2023-09-16 ENCOUNTER — Inpatient Hospital Stay: Payer: PRIVATE HEALTH INSURANCE

## 2023-09-16 ENCOUNTER — Inpatient Hospital Stay: Payer: PRIVATE HEALTH INSURANCE | Admitting: Family

## 2023-09-20 ENCOUNTER — Inpatient Hospital Stay: Payer: Medicare Other | Attending: Hematology & Oncology

## 2023-09-20 ENCOUNTER — Inpatient Hospital Stay (HOSPITAL_BASED_OUTPATIENT_CLINIC_OR_DEPARTMENT_OTHER): Payer: Medicare Other | Admitting: Family

## 2023-09-20 VITALS — BP 122/72 | HR 76 | Temp 98.0°F | Resp 18 | Wt 220.0 lb

## 2023-09-20 DIAGNOSIS — D509 Iron deficiency anemia, unspecified: Secondary | ICD-10-CM

## 2023-09-20 DIAGNOSIS — M8580 Other specified disorders of bone density and structure, unspecified site: Secondary | ICD-10-CM | POA: Diagnosis not present

## 2023-09-20 LAB — CBC WITH DIFFERENTIAL (CANCER CENTER ONLY)
Abs Immature Granulocytes: 0.04 10*3/uL (ref 0.00–0.07)
Basophils Absolute: 0 10*3/uL (ref 0.0–0.1)
Basophils Relative: 0 %
Eosinophils Absolute: 0.2 10*3/uL (ref 0.0–0.5)
Eosinophils Relative: 3 %
HCT: 42.8 % (ref 36.0–46.0)
Hemoglobin: 13.8 g/dL (ref 12.0–15.0)
Immature Granulocytes: 1 %
Lymphocytes Relative: 16 %
Lymphs Abs: 1.3 10*3/uL (ref 0.7–4.0)
MCH: 29.9 pg (ref 26.0–34.0)
MCHC: 32.2 g/dL (ref 30.0–36.0)
MCV: 92.8 fL (ref 80.0–100.0)
Monocytes Absolute: 0.8 10*3/uL (ref 0.1–1.0)
Monocytes Relative: 10 %
Neutro Abs: 5.7 10*3/uL (ref 1.7–7.7)
Neutrophils Relative %: 70 %
Platelet Count: 338 10*3/uL (ref 150–400)
RBC: 4.61 MIL/uL (ref 3.87–5.11)
RDW: 13.1 % (ref 11.5–15.5)
WBC Count: 8.2 10*3/uL (ref 4.0–10.5)
nRBC: 0 % (ref 0.0–0.2)

## 2023-09-20 LAB — RETICULOCYTES
Immature Retic Fract: 13 % (ref 2.3–15.9)
RBC.: 4.59 MIL/uL (ref 3.87–5.11)
Retic Count, Absolute: 81.2 10*3/uL (ref 19.0–186.0)
Retic Ct Pct: 1.8 % (ref 0.4–3.1)

## 2023-09-20 LAB — FERRITIN: Ferritin: 171 ng/mL (ref 11–307)

## 2023-09-20 NOTE — Progress Notes (Addendum)
Hematology and Oncology Follow Up Visit  Olivia Mosley [id number] 1956/04/09 67 y.o. 09/20/2023   Principle Diagnosis:  Iron deficiency anemia   Current Therapy:        IV iron as indicated   Interim History:  Olivia Mosley is here today to re-establish care for IDA. She is feeling fatigued and like her iron may be low.  No issue with blood loss. No bruising or petechiae.  No fever, chills, n/v, cough, rash, dizziness, palpitations, abdominal pain or changes in bowel or bladder habits.  Mild SOB with over exertion.  She has chronic back and joint pain with history of osteoarthritis and osteopenia. She states that she has been referred to an osteoporosis clinic by her pain management provider.   No numbness in her extremities at this time.  No falls or syncope.  Appetite and hydration are good. Weight is stable at 220 lbs.   ECOG Performance Status: 1 - Symptomatic but completely ambulatory  Medications:  Allergies as of 09/20/2023       Reactions   Flagyl [metronidazole Hcl] Hives        Medication List        Accurate as of September 20, 2023  2:13 PM. If you have any questions, ask your nurse or doctor.          acetaminophen 500 MG tablet Commonly known as: TYLENOL Take 500 mg by mouth as needed.   Azelaic Acid 15 % gel Commonly known as: Finacea APPLY 1/2 GRAM TWICE DAILY   azelastine 0.1 % nasal spray Commonly known as: ASTELIN USE 2 SPRAYS IN EACH NOSTRIL TWICE DAILY   b complex vitamins capsule Take by mouth.   clobetasol cream 0.05 % Commonly known as: TEMOVATE APPLY TOPICALLY 2 (TWO) TIMES DAILY.   CVS D3 50 MCG (2000 UT) Caps Generic drug: Cholecalciferol TAKE 1 CAPSULE BY MOUTH EVERY DAY   Cyanocobalamin 5000 MCG/ML Liqd Place under the tongue.   cyclobenzaprine 5 MG tablet Commonly known as: FLEXERIL Take 5 mg by mouth 2 (two) times daily as needed.   escitalopram 10 MG tablet Commonly known as: LEXAPRO TAKE 1 TABLET BY MOUTH EVERY  DAY   gabapentin 100 MG capsule Commonly known as: NEURONTIN Take 200 mg by mouth 3 (three) times daily.   levothyroxine 75 MCG tablet Commonly known as: SYNTHROID TAKE 1 TABLET BY MOUTH EVERY DAY   linaclotide 290 MCG Caps capsule Commonly known as: Linzess Take 1 capsule (290 mcg total) by mouth daily before breakfast.   meloxicam 7.5 MG tablet Commonly known as: MOBIC Take 7.5 mg by mouth as needed for pain.   multivitamin capsule Take by mouth.   Restasis 0.05 % ophthalmic emulsion Generic drug: cycloSPORINE   rosuvastatin 10 MG tablet Commonly known as: CRESTOR TAKE 1 TABLET BY MOUTH EVERY DAY   traZODone 50 MG tablet Commonly known as: DESYREL TAKE 2 TABLETS BY MOUTH AT BEDTIME AS NEEDED FOR SLEEP   tretinoin 0.025 % cream Commonly known as: RETIN-A Apply topically at bedtime.   triamcinolone cream 0.1 % Commonly known as: KENALOG 1 (ONE) APPLICATION TO EAR CANAL ENTRANCE DAILY PRN ITCHING AND IRRITATION   vitamin C 100 MG tablet Take by mouth.        Allergies:  Allergies  Allergen Reactions   Flagyl [Metronidazole Hcl] Hives    Past Medical History, Surgical history, Social history, and Family History were reviewed and updated.  Review of Systems: All other 10 point review of systems is negative.  Physical Exam:  vitals were not taken for this visit.   Wt Readings from Last 3 Encounters:  01/03/22 232 lb 6.4 oz (105.4 kg)  12/26/20 232 lb (105.2 kg)  07/12/20 240 lb (108.9 kg)    Ocular: Sclerae unicteric, pupils equal, round and reactive to light Ear-nose-throat: Oropharynx clear, dentition fair Lymphatic: No cervical or supraclavicular adenopathy Lungs no rales or rhonchi, good excursion bilaterally Heart regular rate and rhythm, no murmur appreciated Abd soft, nontender, positive bowel sounds MSK no focal spinal tenderness, no joint edema Neuro: non-focal, well-oriented, appropriate affect Breasts: Deferred   Lab Results   Component Value Date   WBC 8.2 09/20/2023   HGB 13.8 09/20/2023   HCT 42.8 09/20/2023   MCV 92.8 09/20/2023   PLT 338 09/20/2023   Lab Results  Component Value Date   FERRITIN 295 01/03/2022   IRON 74 01/03/2022   TIBC 335 01/03/2022   UIBC 261 01/03/2022   IRONPCTSAT 22 01/03/2022   Lab Results  Component Value Date   RETICCTPCT 1.8 09/20/2023   RBC 4.59 09/20/2023   RETICCTABS 77.8 09/01/2015   No results found for: "KPAFRELGTCHN", "LAMBDASER", "KAPLAMBRATIO" No results found for: "IGGSERUM", "IGA", "IGMSERUM" No results found for: "TOTALPROTELP", "ALBUMINELP", "A1GS", "A2GS", "BETS", "BETA2SER", "GAMS", "MSPIKE", "SPEI"   Chemistry      Component Value Date/Time   NA 137 03/07/2021 1515   NA 140 09/13/2017 1125   NA 138 09/01/2015 1117   K 4.9 03/07/2021 1515   K 4.2 09/13/2017 1125   K 4.4 09/01/2015 1117   CL 102 03/07/2021 1515   CL 104 09/13/2017 1125   CO2 28 03/07/2021 1515   CO2 27 09/13/2017 1125   CO2 18 (L) 09/01/2015 1117   BUN 18 03/07/2021 1515   BUN 17 09/13/2017 1125   BUN 29.4 (H) 09/01/2015 1117   CREATININE 0.76 03/07/2021 1515   CREATININE 0.7 09/13/2017 1125   CREATININE 0.9 09/01/2015 1117      Component Value Date/Time   CALCIUM 10.6 (H) 03/07/2021 1515   CALCIUM 9.6 09/13/2017 1125   CALCIUM 10.1 09/01/2015 1117   ALKPHOS 72 03/07/2021 1515   ALKPHOS 70 09/13/2017 1125   ALKPHOS 90 09/01/2015 1117   AST 18 03/07/2021 1515   AST 13 09/01/2015 1117   ALT 18 03/07/2021 1515   ALT 31 09/13/2017 1125   ALT 15 09/01/2015 1117   BILITOT 0.3 03/07/2021 1515   BILITOT 0.33 09/01/2015 1117       Impression and Plan: Olivia Mosley is a very pleasant 67 yo caucasian female with iron deficiency anemia.   Iron studies are pending. We will replace if needed.  Follow-up annually.   Eileen Stanford, NP 12/27/20242:13 PM

## 2023-09-23 ENCOUNTER — Encounter: Payer: Self-pay | Admitting: Family

## 2023-09-23 LAB — IRON AND IRON BINDING CAPACITY (CC-WL,HP ONLY)
Iron: 73 ug/dL (ref 28–170)
Saturation Ratios: 22 % (ref 10.4–31.8)
TIBC: 340 ug/dL (ref 250–450)
UIBC: 267 ug/dL (ref 148–442)

## 2024-03-06 ENCOUNTER — Telehealth: Payer: Self-pay | Admitting: *Deleted

## 2024-03-06 NOTE — Telephone Encounter (Signed)
 Message received from patient requesting to come in for a lab appt and to see Olivia Mosley regarding increased fatigue.  Message sent to scheduling.

## 2024-03-17 ENCOUNTER — Inpatient Hospital Stay: Admitting: Family

## 2024-03-17 ENCOUNTER — Inpatient Hospital Stay

## 2024-03-20 ENCOUNTER — Inpatient Hospital Stay (HOSPITAL_BASED_OUTPATIENT_CLINIC_OR_DEPARTMENT_OTHER): Admitting: Family

## 2024-03-20 ENCOUNTER — Inpatient Hospital Stay: Attending: Hematology & Oncology

## 2024-03-20 ENCOUNTER — Encounter: Payer: Self-pay | Admitting: Family

## 2024-03-20 VITALS — BP 124/76 | HR 90 | Temp 98.4°F | Resp 18 | Wt 211.1 lb

## 2024-03-20 DIAGNOSIS — D509 Iron deficiency anemia, unspecified: Secondary | ICD-10-CM | POA: Diagnosis not present

## 2024-03-20 LAB — CBC WITH DIFFERENTIAL (CANCER CENTER ONLY)
Abs Immature Granulocytes: 0.08 10*3/uL — ABNORMAL HIGH (ref 0.00–0.07)
Basophils Absolute: 0 10*3/uL (ref 0.0–0.1)
Basophils Relative: 1 %
Eosinophils Absolute: 0.2 10*3/uL (ref 0.0–0.5)
Eosinophils Relative: 3 %
HCT: 43.4 % (ref 36.0–46.0)
Hemoglobin: 14.2 g/dL (ref 12.0–15.0)
Immature Granulocytes: 1 %
Lymphocytes Relative: 19 %
Lymphs Abs: 1.4 10*3/uL (ref 0.7–4.0)
MCH: 29.9 pg (ref 26.0–34.0)
MCHC: 32.7 g/dL (ref 30.0–36.0)
MCV: 91.4 fL (ref 80.0–100.0)
Monocytes Absolute: 0.8 10*3/uL (ref 0.1–1.0)
Monocytes Relative: 11 %
Neutro Abs: 4.9 10*3/uL (ref 1.7–7.7)
Neutrophils Relative %: 65 %
Platelet Count: 323 10*3/uL (ref 150–400)
RBC: 4.75 MIL/uL (ref 3.87–5.11)
RDW: 13.2 % (ref 11.5–15.5)
WBC Count: 7.4 10*3/uL (ref 4.0–10.5)
nRBC: 0 % (ref 0.0–0.2)

## 2024-03-20 LAB — IRON AND IRON BINDING CAPACITY (CC-WL,HP ONLY)
Iron: 90 ug/dL (ref 28–170)
Saturation Ratios: 28 % (ref 10.4–31.8)
TIBC: 325 ug/dL (ref 250–450)
UIBC: 235 ug/dL

## 2024-03-20 LAB — FERRITIN: Ferritin: 237 ng/mL (ref 11–307)

## 2024-03-20 LAB — RETICULOCYTES
Immature Retic Fract: 15.4 % (ref 2.3–15.9)
RBC.: 4.76 MIL/uL (ref 3.87–5.11)
Retic Count, Absolute: 73.8 10*3/uL (ref 19.0–186.0)
Retic Ct Pct: 1.6 % (ref 0.4–3.1)

## 2024-03-20 NOTE — Progress Notes (Signed)
 Hematology and Oncology Follow Up Visit  Olivia Mosley [id number] July 05, 1956 68 y.o. 03/20/2024   Principle Diagnosis:  Iron  deficiency anemia   Current Therapy:        IV iron  as indicated   Interim History:  [name] is here today for follow-up. She is symptomatic with fatigue and came in early to have her iron  checked.  She states that she can sleep for 12 hours and wake up tired.  She has had SOB with exertion and dizziness. Occasional palpitations.  No obvious blood loss noted. No bruising or petechiae.  No fever, chills, n/v, cough, rash, dizziness, SOB, chest pain, palpitations, abdominal pain or changes in bowel or bladder habits. No swelling, tenderness, numbness or tingling in her extremities at this time.  No falls or syncope reported.  Appetite and hydration are good. Weight is stable at 211 lbs.   ECOG Performance Status: 1 - Symptomatic but completely ambulatory  Medications:  Allergies as of 03/20/2024       Reactions   Flagyl [metronidazole Hcl] Hives        Medication List        Accurate as of March 20, 2024  3:07 PM. If you have any questions, ask your nurse or doctor.          acetaminophen  500 MG tablet Commonly known as: TYLENOL  Take 500 mg by mouth as needed.   Azelaic Acid  15 % gel Commonly known as: Finacea  APPLY 1/2 GRAM TWICE DAILY   azelastine  0.1 % nasal spray Commonly known as: ASTELIN  USE 2 SPRAYS IN EACH NOSTRIL TWICE DAILY   b complex vitamins capsule Take by mouth.   clobetasol  cream 0.05 % Commonly known as: TEMOVATE  APPLY TOPICALLY 2 (TWO) TIMES DAILY.   CVS D3 50 MCG (2000 UT) Caps Generic drug: Cholecalciferol  TAKE 1 CAPSULE BY MOUTH EVERY DAY   cyclobenzaprine 5 MG tablet Commonly known as: FLEXERIL Take 5 mg by mouth 2 (two) times daily as needed.   escitalopram  10 MG tablet Commonly known as: LEXAPRO  TAKE 1 TABLET BY MOUTH EVERY DAY   fish oil-omega-3 fatty acids 1000 MG capsule Take 1 g by mouth  daily.   gabapentin 100 MG capsule Commonly known as: NEURONTIN Take 200 mg by mouth 3 (three) times daily.   levothyroxine  75 MCG tablet Commonly known as: SYNTHROID  TAKE 1 TABLET BY MOUTH EVERY DAY   linaclotide  290 MCG Caps capsule Commonly known as: Linzess  Take 1 capsule (290 mcg total) by mouth daily before breakfast.   meloxicam  7.5 MG tablet Commonly known as: MOBIC  Take 7.5 mg by mouth as needed for pain.   multivitamin capsule Take by mouth.   Restasis 0.05 % ophthalmic emulsion Generic drug: cycloSPORINE   rosuvastatin  10 MG tablet Commonly known as: CRESTOR  TAKE 1 TABLET BY MOUTH EVERY DAY   traZODone  50 MG tablet Commonly known as: DESYREL  TAKE 2 TABLETS BY MOUTH AT BEDTIME AS NEEDED FOR SLEEP   tretinoin  0.025 % cream Commonly known as: RETIN-A  Apply topically at bedtime.   triamcinolone  cream 0.1 % Commonly known as: KENALOG  1 (ONE) APPLICATION TO EAR CANAL ENTRANCE DAILY PRN ITCHING AND IRRITATION   vitamin B-12 500 MCG tablet Commonly known as: CYANOCOBALAMIN Take 500 mcg by mouth daily. What changed: Another medication with the same name was removed. Continue taking this medication, and follow the directions you see here. Changed by: Lauraine Pepper   vitamin C 100 MG tablet Take by mouth.  Allergies:  Allergies  Allergen Reactions   Flagyl [Metronidazole Hcl] Hives    Past Medical History, Surgical history, Social history, and Family History were reviewed and updated.  Review of Systems: All other 10 point review of systems is negative.   Physical Exam:  weight is 211 lb 1.9 oz (95.8 kg). Her oral temperature is 98.4 F (36.9 C). Her blood pressure is 124/76 and her pulse is 90. Her respiration is 18 and oxygen saturation is 98%.   Wt Readings from Last 3 Encounters:  03/20/24 211 lb 1.9 oz (95.8 kg)  09/20/23 220 lb (99.8 kg)  01/03/22 232 lb 6.4 oz (105.4 kg)    Ocular: Sclerae unicteric, pupils equal, round and  reactive to light Ear-nose-throat: Oropharynx clear, dentition fair Lymphatic: No cervical or supraclavicular adenopathy Lungs no rales or rhonchi, good excursion bilaterally Heart regular rate and rhythm, no murmur appreciated Abd soft, nontender, positive bowel sounds MSK no focal spinal tenderness, no joint edema Neuro: non-focal, well-oriented, appropriate affect Breasts: Deferred   Lab Results  Component Value Date   WBC 7.4 03/20/2024   HGB 14.2 03/20/2024   HCT 43.4 03/20/2024   MCV 91.4 03/20/2024   PLT 323 03/20/2024   Lab Results  Component Value Date   FERRITIN 171 09/20/2023   IRON  73 09/20/2023   TIBC 340 09/20/2023   UIBC 267 09/20/2023   IRONPCTSAT 22 09/20/2023   Lab Results  Component Value Date   RETICCTPCT 1.6 03/20/2024   RBC 4.76 03/20/2024   RBC 4.75 03/20/2024   RETICCTABS 77.8 09/01/2015   No results found for: KPAFRELGTCHN, LAMBDASER, KAPLAMBRATIO No results found for: IGGSERUM, IGA, IGMSERUM No results found for: STEPHANY CARLOTA BENSON MARKEL EARLA JOANNIE DOC VICK, SPEI   Chemistry      Component Value Date/Time   NA 137 03/07/2021 1515   NA 140 09/13/2017 1125   NA 138 09/01/2015 1117   K 4.9 03/07/2021 1515   K 4.2 09/13/2017 1125   K 4.4 09/01/2015 1117   CL 102 03/07/2021 1515   CL 104 09/13/2017 1125   CO2 28 03/07/2021 1515   CO2 27 09/13/2017 1125   CO2 18 (L) 09/01/2015 1117   BUN 18 03/07/2021 1515   BUN 17 09/13/2017 1125   BUN 29.4 (H) 09/01/2015 1117   CREATININE 0.76 03/07/2021 1515   CREATININE 0.7 09/13/2017 1125   CREATININE 0.9 09/01/2015 1117      Component Value Date/Time   CALCIUM  10.6 (H) 03/07/2021 1515   CALCIUM  9.6 09/13/2017 1125   CALCIUM  10.1 09/01/2015 1117   ALKPHOS 72 03/07/2021 1515   ALKPHOS 70 09/13/2017 1125   ALKPHOS 90 09/01/2015 1117   AST 18 03/07/2021 1515   AST 13 09/01/2015 1117   ALT 18 03/07/2021 1515   ALT 31 09/13/2017 1125   ALT 15  09/01/2015 1117   BILITOT 0.3 03/07/2021 1515   BILITOT 0.33 09/01/2015 1117       Impression and Plan: [name] is a very pleasant 68 yo caucasian female with iron  deficiency anemia.   Iron  studies are pending. We will replace if needed.  Follow-up annually.   Lauraine Pepper, NP 6/27/20253:07 PM

## 2024-09-21 ENCOUNTER — Inpatient Hospital Stay: Payer: Medicare Other

## 2024-09-21 ENCOUNTER — Ambulatory Visit: Payer: PRIVATE HEALTH INSURANCE | Admitting: Family

## 2025-03-22 ENCOUNTER — Ambulatory Visit: Admitting: Family

## 2025-03-22 ENCOUNTER — Inpatient Hospital Stay
